# Patient Record
Sex: Male | Born: 1945 | Race: White | Hispanic: No | Marital: Married | Smoking: Former smoker
Health system: Southern US, Community
[De-identification: ages and names within clinical notes are randomized; demographics above are authoritative.]

## PROBLEM LIST (undated history)

## (undated) DIAGNOSIS — K219 Gastro-esophageal reflux disease without esophagitis: Secondary | ICD-10-CM

## (undated) DIAGNOSIS — I499 Cardiac arrhythmia, unspecified: Secondary | ICD-10-CM

## (undated) DIAGNOSIS — I10 Essential (primary) hypertension: Secondary | ICD-10-CM

## (undated) DIAGNOSIS — I200 Unstable angina: Secondary | ICD-10-CM

## (undated) DIAGNOSIS — F419 Anxiety disorder, unspecified: Secondary | ICD-10-CM

## (undated) DIAGNOSIS — I2510 Atherosclerotic heart disease of native coronary artery without angina pectoris: Secondary | ICD-10-CM

## (undated) DIAGNOSIS — J449 Chronic obstructive pulmonary disease, unspecified: Secondary | ICD-10-CM

## (undated) DIAGNOSIS — Z720 Tobacco use: Secondary | ICD-10-CM

## (undated) DIAGNOSIS — E785 Hyperlipidemia, unspecified: Secondary | ICD-10-CM

## (undated) DIAGNOSIS — R0600 Dyspnea, unspecified: Secondary | ICD-10-CM

## (undated) DIAGNOSIS — K5792 Diverticulitis of intestine, part unspecified, without perforation or abscess without bleeding: Secondary | ICD-10-CM

## (undated) DIAGNOSIS — J189 Pneumonia, unspecified organism: Secondary | ICD-10-CM

## (undated) HISTORY — DX: Anxiety disorder, unspecified: F41.9

## (undated) HISTORY — PX: COLONOSCOPY: SHX174

## (undated) HISTORY — DX: Hyperlipidemia, unspecified: E78.5

## (undated) HISTORY — DX: Gastro-esophageal reflux disease without esophagitis: K21.9

## (undated) HISTORY — PX: SHOULDER SURGERY: SHX246

---

## 2002-12-04 ENCOUNTER — Ambulatory Visit (HOSPITAL_COMMUNITY): Admission: RE | Admit: 2002-12-04 | Discharge: 2002-12-04 | Payer: Self-pay | Admitting: Internal Medicine

## 2009-09-08 DIAGNOSIS — F419 Anxiety disorder, unspecified: Secondary | ICD-10-CM | POA: Insufficient documentation

## 2011-01-31 ENCOUNTER — Encounter: Payer: Self-pay | Admitting: Cardiology

## 2011-01-31 ENCOUNTER — Ambulatory Visit (INDEPENDENT_AMBULATORY_CARE_PROVIDER_SITE_OTHER): Payer: 59 | Admitting: Cardiology

## 2011-01-31 VITALS — BP 152/77 | HR 79 | Ht 73.0 in | Wt 187.0 lb

## 2011-01-31 DIAGNOSIS — Z720 Tobacco use: Secondary | ICD-10-CM | POA: Insufficient documentation

## 2011-01-31 DIAGNOSIS — F17200 Nicotine dependence, unspecified, uncomplicated: Secondary | ICD-10-CM

## 2011-01-31 DIAGNOSIS — E785 Hyperlipidemia, unspecified: Secondary | ICD-10-CM | POA: Insufficient documentation

## 2011-01-31 NOTE — Progress Notes (Signed)
   Troy Jackson Date of Birth: 1946-01-19 Medical Record #[id number]  History of Present Illness: Troy Jackson is a 64 year old white male referred by Dr. Waynard Edwards for evaluation of cardiovascular risk. Known cardiac history. He reports having a stress test with me in 2004 which was normal. His cardiovascular risk factors include a history of hyperlipidemia and tobacco use. He is currently smoking one pack per day. He has no interest in quitting. He has been prescribed Vytorin but doesn't take it regularly. He denies any symptoms of shortness of breath, chest pain, or palpitations. He is able to do all his yard work without difficulty. He may get a little fatigued if he has trouble starting his chainsaw or if he has to push his lawn mower.  No current outpatient prescriptions on file prior to visit.    Allergies  Allergen Reactions  . Crestor (Rosuvastatin Calcium) Other (See Comments)    Myalgias     Past Medical History  Diagnosis Date  . Hyperlipidemia   . GERD (gastroesophageal reflux disease)   . Anxiety     Past Surgical History  Procedure Date  . Shoulder surgery     right as child  . Shoulder surgery     left as child    History  Smoking status  . Current Some Day Smoker -- 1.0 packs/day  . Types: Cigarettes  Smokeless tobacco  . Not on file    History  Alcohol Use  . Yes    Family History  Problem Relation Age of Onset  . Heart disease Father   . Lung cancer Father   . Heart attack Father   . Heart attack Paternal Grandfather   . Alcohol abuse Father   . Alcohol abuse Paternal Grandfather   . Alcohol abuse Brother   . Alzheimer's disease Mother     Review of Systems: As noted in history of present illness.  All other systems were reviewed and are negative.  Physical Exam: BP 152/77  Pulse 79  Ht 6\' 1"  (1.854 m)  Wt 187 lb (84.823 kg)  BMI 24.67 kg/m2 The patient is alert and oriented x 3.  The mood and affect are normal.  The skin is warm and  dry.  Color is normal.  The HEENT exam reveals that the sclera are nonicteric.  The mucous membranes are moist.  The carotids are 2+ without bruits.  There is no thyromegaly.  There is no JVD.  The lungs are clear.  The chest wall is non tender.  The heart exam reveals a regular rate with a normal S1 and S2.  There are no murmurs, gallops, or rubs.  The PMI is not displaced.   Abdominal exam reveals good bowel sounds.  There is no guarding or rebound.  There is no hepatosplenomegaly or tenderness.  There are no masses.  Exam of the legs reveal no clubbing, cyanosis, or edema.  The legs are without rashes.  The distal pulses are intact.  Cranial nerves II - XII are intact.  Motor and sensory functions are intact.  The gait is normal.  LABORATORY DATA: ECG today is normal.  Assessment / Plan:

## 2011-01-31 NOTE — Patient Instructions (Signed)
I recommend you quit smoking.  Stay active with regular aerobic activity.  If you should develop symptoms of chest pain or worsening shortness of breath then let me know.

## 2011-01-31 NOTE — Assessment & Plan Note (Signed)
I have recommended that he take his Vytorin as prescribed by Dr. Waynard Edwards. From a cardiovascular standpoint his risk is low. He has a normal exam and ECG. He has no symptoms to suggest angina. I don't recommend a stress test at this point unless he should develop cardiac symptoms.

## 2011-01-31 NOTE — Assessment & Plan Note (Signed)
I stressed the importance of smoking cessation. He has no interest in quitting at this time.

## 2012-01-02 ENCOUNTER — Encounter (HOSPITAL_COMMUNITY): Payer: Self-pay | Admitting: Emergency Medicine

## 2012-01-02 ENCOUNTER — Emergency Department (HOSPITAL_COMMUNITY)
Admission: EM | Admit: 2012-01-02 | Discharge: 2012-01-02 | Disposition: A | Discharge status: Home / Self Care | Payer: Medicare Other | Attending: Emergency Medicine | Admitting: Emergency Medicine

## 2012-01-02 DIAGNOSIS — Z79899 Other long term (current) drug therapy: Secondary | ICD-10-CM | POA: Insufficient documentation

## 2012-01-02 DIAGNOSIS — Z7982 Long term (current) use of aspirin: Secondary | ICD-10-CM | POA: Insufficient documentation

## 2012-01-02 DIAGNOSIS — E785 Hyperlipidemia, unspecified: Secondary | ICD-10-CM | POA: Insufficient documentation

## 2012-01-02 DIAGNOSIS — F17200 Nicotine dependence, unspecified, uncomplicated: Secondary | ICD-10-CM | POA: Insufficient documentation

## 2012-01-02 DIAGNOSIS — M25569 Pain in unspecified knee: Secondary | ICD-10-CM | POA: Insufficient documentation

## 2012-01-02 DIAGNOSIS — F411 Generalized anxiety disorder: Secondary | ICD-10-CM | POA: Insufficient documentation

## 2012-01-02 DIAGNOSIS — K219 Gastro-esophageal reflux disease without esophagitis: Secondary | ICD-10-CM | POA: Insufficient documentation

## 2012-01-02 NOTE — ED Provider Notes (Signed)
History     CSN: [id number]  Arrival date & time 01/02/12  1722   First MD Initiated Contact with Patient 01/02/12 1833      Chief Complaint  Patient presents with  . Bradycardia    (Consider location/radiation/quality/duration/timing/severity/associated sxs/prior treatment) HPI Comments: This is a 66 year old male, who presents to the emergency department with the chief complaint of bradycardia. The patient also complains of bilateral knee pain. The patient states that earlier today, he took his heart rate and found that it was 44. He used a home pulse/ox device. Several minutes later, and he checked his heart rate again and that was normal. Patient denies chest pain, shortness of breath, dizziness, and weakness. Patient has not been pain. He states that his knees only bother him if he is too active.  The history is provided by the patient. No language interpreter was used.    Past Medical History  Diagnosis Date  . Hyperlipidemia   . GERD (gastroesophageal reflux disease)   . Anxiety     Past Surgical History  Procedure Date  . Shoulder surgery     right as child  . Shoulder surgery     left as child    Family History  Problem Relation Age of Onset  . Heart disease Father   . Lung cancer Father   . Heart attack Father   . Heart attack Paternal Grandfather   . Alcohol abuse Father   . Alcohol abuse Paternal Grandfather   . Alcohol abuse Brother   . Alzheimer's disease Mother     History  Substance Use Topics  . Smoking status: Current Some Day Smoker -- 1.0 packs/day    Types: Cigarettes  . Smokeless tobacco: Not on file  . Alcohol Use: Yes      Review of Systems  Constitutional: Negative for fever.  HENT: Negative for rhinorrhea.   Eyes: Negative for visual disturbance.  Respiratory: Negative for shortness of breath.   Cardiovascular: Negative for chest pain.  Gastrointestinal: Negative for abdominal pain.  Genitourinary: Negative for dysuria.    Musculoskeletal: Negative for back pain.       Bilateral knee pain  Skin: Negative for rash.  Neurological: Negative for dizziness, syncope, weakness, light-headedness and numbness.  Psychiatric/Behavioral: The patient is not nervous/anxious.   All other systems reviewed and are negative.    Allergies  Crestor  Home Medications   Current Outpatient Rx  Name Route Sig Dispense Refill  . ALPRAZOLAM 0.5 MG PO TABS Oral Take 0.5 mg by mouth at bedtime as needed. For anxiety    . ASPIRIN 81 MG PO TABS Oral Take 81 mg by mouth daily.      Marland Kitchen ESOMEPRAZOLE MAGNESIUM 40 MG PO CPDR Oral Take 40 mg by mouth daily before breakfast.     . EZETIMIBE-SIMVASTATIN 10-20 MG PO TABS Oral Take 1 tablet by mouth at bedtime.        BP 171/80  Pulse 90  Temp 98.7 F (37.1 C) (Oral)  SpO2 97%  Physical Exam  Nursing note and vitals reviewed. Constitutional: He is oriented to person, place, and time. He appears well-developed and well-nourished.  HENT:  Head: Normocephalic and atraumatic.  Eyes: Conjunctivae normal and EOM are normal. Pupils are equal, round, and reactive to light.  Neck: Normal range of motion. Neck supple.  Cardiovascular: Normal rate, regular rhythm and normal heart sounds.   Pulmonary/Chest: Effort normal and breath sounds normal.  Abdominal: Soft. Bowel sounds are normal.  Musculoskeletal: Normal  range of motion. He exhibits no edema and no tenderness.       No clicking, popping, or catching. Stability testing is intact bilaterally. Patient walks without a limp.  Neurological: He is alert and oriented to person, place, and time.  Skin: Skin is warm and dry.  Psychiatric: He has a normal mood and affect. His behavior is normal. Judgment and thought content normal.    ED Course  Procedures (including critical care time)  Labs Reviewed - No data to display No results found. ED ECG REPORT  I personally interpreted this EKG   Date: 01/02/2012   Rate:82  Rhythm: normal  sinus rhythm  QRS Axis: normal  Intervals: normal  ST/T Wave abnormalities: normal  Conduction Disutrbances:none  Narrative Interpretation:   Old EKG Reviewed: none available    1. Knee pain       MDM  66 year old male with knee pain. Patient shows no sign of bradycardia while in the ED. I'm going to refer the patient to his PCP for further workup if deemed necessary. 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 Will recommend Tylenol for the knee pain. Patient is stable and ready for discharge.        Roxy Horseman, PA-C 01/02/12 1906

## 2012-01-02 NOTE — ED Provider Notes (Signed)
Medical screening examination/treatment/procedure(s) were performed by non-physician practitioner and as supervising physician I was immediately available for consultation/collaboration.   Gerhard Munch, MD 01/02/12 [phone]

## 2012-01-02 NOTE — ED Notes (Signed)
Pt st's when he was at home at rest this pulse rate was 44.  Only c/o he has is bil knee pain over past few days.  Pt denies any chest pain or shortness of breath.

## 2012-01-02 NOTE — ED Notes (Signed)
Pt is retired Company secretary.  States he took his pulse this morning and it was 44. Denies any symptoms and pulse has been fine since.  Pt came to ED to be checked for bilateral knee pain after walking long distances.  Pt takes tylenol at night per MD orders for the knee pain but takes no meds during the day.  Pt states the knee pain is relieved by rest

## 2012-09-23 ENCOUNTER — Encounter (HOSPITAL_BASED_OUTPATIENT_CLINIC_OR_DEPARTMENT_OTHER): Payer: Self-pay | Admitting: *Deleted

## 2012-09-23 ENCOUNTER — Emergency Department (HOSPITAL_BASED_OUTPATIENT_CLINIC_OR_DEPARTMENT_OTHER)
Admission: EM | Admit: 2012-09-23 | Discharge: 2012-09-23 | Disposition: A | Discharge status: Home / Self Care | Payer: Medicare Other | Attending: Emergency Medicine | Admitting: Emergency Medicine

## 2012-09-23 DIAGNOSIS — Z862 Personal history of diseases of the blood and blood-forming organs and certain disorders involving the immune mechanism: Secondary | ICD-10-CM | POA: Insufficient documentation

## 2012-09-23 DIAGNOSIS — F17200 Nicotine dependence, unspecified, uncomplicated: Secondary | ICD-10-CM | POA: Insufficient documentation

## 2012-09-23 DIAGNOSIS — S81009A Unspecified open wound, unspecified knee, initial encounter: Secondary | ICD-10-CM | POA: Insufficient documentation

## 2012-09-23 DIAGNOSIS — W1809XA Striking against other object with subsequent fall, initial encounter: Secondary | ICD-10-CM | POA: Insufficient documentation

## 2012-09-23 DIAGNOSIS — Y9289 Other specified places as the place of occurrence of the external cause: Secondary | ICD-10-CM | POA: Insufficient documentation

## 2012-09-23 DIAGNOSIS — F411 Generalized anxiety disorder: Secondary | ICD-10-CM | POA: Insufficient documentation

## 2012-09-23 DIAGNOSIS — Y9389 Activity, other specified: Secondary | ICD-10-CM | POA: Insufficient documentation

## 2012-09-23 DIAGNOSIS — Z7982 Long term (current) use of aspirin: Secondary | ICD-10-CM | POA: Insufficient documentation

## 2012-09-23 DIAGNOSIS — K219 Gastro-esophageal reflux disease without esophagitis: Secondary | ICD-10-CM | POA: Insufficient documentation

## 2012-09-23 DIAGNOSIS — Z79899 Other long term (current) drug therapy: Secondary | ICD-10-CM | POA: Insufficient documentation

## 2012-09-23 DIAGNOSIS — Z8639 Personal history of other endocrine, nutritional and metabolic disease: Secondary | ICD-10-CM | POA: Insufficient documentation

## 2012-09-23 DIAGNOSIS — S81811A Laceration without foreign body, right lower leg, initial encounter: Secondary | ICD-10-CM

## 2012-09-23 MED ORDER — SULFAMETHOXAZOLE-TRIMETHOPRIM 800-160 MG PO TABS: 1.0000 | ORAL_TABLET | Freq: Two times a day (BID) | ORAL | Status: AC

## 2012-09-23 MED ORDER — HYDROCODONE-ACETAMINOPHEN 5-325 MG PO TABS: 2.0000 | ORAL_TABLET | ORAL | Status: DC | PRN

## 2012-09-23 NOTE — ED Provider Notes (Signed)
History  This chart was scribed for Glynn Octave, MD, by Yevette Edwards, ED Scribe. This patient was seen in room MH12/MH12 and the patient's care was started at 10:34 PM.  CSN: [id number] Arrival date & time 09/23/12  2038  First MD Initiated Contact with Patient 09/23/12 2233     Chief Complaint  Patient presents with  . Extremity Laceration    The history is provided by the patient. No language interpreter was used.   HPI Comments: Troy Jackson is a 67 y.o. male who presents to the Emergency Department complaining of a laceration to  the medial aspect of his right lower leg which occurred earlier today after he fell onto the edge of a corn-hole board. The pt reports that he is ambulatory, though he reports that there is pain associated with the laceration. He states that he had a tetanus booster last year. He denies usage of blood thinners. He has a h/o of hyperlipidemia. He is a current smoker, and he uses alcohol. Past Medical History  Diagnosis Date  . Hyperlipidemia   . GERD (gastroesophageal reflux disease)   . Anxiety    Past Surgical History  Procedure Laterality Date  . Shoulder surgery      right as child  . Shoulder surgery      left as child   Family History  Problem Relation Age of Onset  . Heart disease Father   . Lung cancer Father   . Heart attack Father   . Heart attack Paternal Grandfather   . Alcohol abuse Father   . Alcohol abuse Paternal Grandfather   . Alcohol abuse Brother   . Alzheimer's disease Mother    History  Substance Use Topics  . Smoking status: Current Some Day Smoker -- 1.00 packs/day    Types: Cigarettes  . Smokeless tobacco: Not on file  . Alcohol Use: Yes     Comment: occasional     Review of Systems  Constitutional: Negative for fever.  Skin: Positive for wound.  All other systems reviewed and are negative.    Allergies  Crestor  Home Medications   Current Outpatient Rx  Name  Route  Sig  Dispense  Refill   . ALPRAZolam (XANAX) 0.5 MG tablet   Oral   Take 0.5 mg by mouth at bedtime as needed. For anxiety         . aspirin 81 MG tablet   Oral   Take 81 mg by mouth daily.           Marland Kitchen esomeprazole (NEXIUM) 40 MG capsule   Oral   Take 40 mg by mouth daily before breakfast.          . ezetimibe-simvastatin (VYTORIN) 10-20 MG per tablet   Oral   Take 1 tablet by mouth at bedtime.            Triage Vitals: BP 145/84  Pulse 82  Temp(Src) 98.8 F (37.1 C) (Oral)  SpO2 96%  Physical Exam  Nursing note and vitals reviewed. Constitutional: He is oriented to person, place, and time. He appears well-developed and well-nourished. No distress.  HENT:  Head: Normocephalic and atraumatic.  Eyes: EOM are normal.  Neck: Normal range of motion. Neck supple. No tracheal deviation present.  Cardiovascular: Normal rate.   Pulmonary/Chest: Effort normal and breath sounds normal. No respiratory distress.  Musculoskeletal: Normal range of motion.  3 cm transverse laceration to the right lower leg with fat exposed and fascia visible. Full  ROM of toes and ankle. +2 DP and PT pulse.   Neurological: He is alert and oriented to person, place, and time.  Skin: Skin is warm and dry.  Psychiatric: He has a normal mood and affect. His behavior is normal.    ED Course  Procedures (including critical care time)  DIAGNOSTIC STUDIES: Oxygen Saturation is 96% on room air, normal by my interpretation.    COORDINATION OF CARE:  10:36 PM Discussed treatment plan with patient which includes sutures and the possibility of a scar due to the nature of the wound, and the patient agreed to the plan.    Labs Reviewed - No data to display No results found. No diagnosis found.  MDM  RLE laceration from wooden board. Tetanus UTD.   Neurovascularly intact, fascia visible. Remaining skin is very thin. Patient understands that scar is likely, will take wide bites to not pull through skin.  Laceration  repaired by PAC sofia.  I personally performed the services described in this documentation, which was scribed in my presence. The recorded information has been reviewed and is accurate.    Glynn Octave, MD 09/24/12 [phone]

## 2012-09-23 NOTE — ED Notes (Signed)
Was playing corn hole when he lost his balance and fell into the edge of the corn hold wooden board causing a lacertion/skin tear to his right inner lower leg. Over 2 inch laceration with bleeding controlled.  No numbness or tingling of his toes. Td is  Up to date.

## 2012-09-23 NOTE — ED Provider Notes (Signed)
   History    CSN: [id number] Arrival date & time 09/23/12  2038  First MD Initiated Contact with Patient 09/23/12 2233     Chief Complaint  Patient presents with  . Extremity Laceration   (Consider location/radiation/quality/duration/timing/severity/associated sxs/prior Treatment) HPI Past Medical History  Diagnosis Date  . Hyperlipidemia   . GERD (gastroesophageal reflux disease)   . Anxiety    Past Surgical History  Procedure Laterality Date  . Shoulder surgery      right as child  . Shoulder surgery      left as child   Family History  Problem Relation Age of Onset  . Heart disease Father   . Lung cancer Father   . Heart attack Father   . Heart attack Paternal Grandfather   . Alcohol abuse Father   . Alcohol abuse Paternal Grandfather   . Alcohol abuse Brother   . Alzheimer's disease Mother    History  Substance Use Topics  . Smoking status: Current Some Day Smoker -- 1.00 packs/day    Types: Cigarettes  . Smokeless tobacco: Not on file  . Alcohol Use: Yes     Comment: occasional     Review of Systems  Allergies  Crestor  Home Medications   Current Outpatient Rx  Name  Route  Sig  Dispense  Refill  . ALPRAZolam (XANAX) 0.5 MG tablet   Oral   Take 0.5 mg by mouth at bedtime as needed. For anxiety         . aspirin 81 MG tablet   Oral   Take 81 mg by mouth daily.           Marland Kitchen esomeprazole (NEXIUM) 40 MG capsule   Oral   Take 40 mg by mouth daily before breakfast.          . ezetimibe-simvastatin (VYTORIN) 10-20 MG per tablet   Oral   Take 1 tablet by mouth at bedtime.           Marland Kitchen HYDROcodone-acetaminophen (NORCO/VICODIN) 5-325 MG per tablet   Oral   Take 2 tablets by mouth every 4 (four) hours as needed.   20 tablet   0   . sulfamethoxazole-trimethoprim (BACTRIM DS,SEPTRA DS) 800-160 MG per tablet   Oral   Take 1 tablet by mouth 2 (two) times daily.   14 tablet   0    BP 145/84  Pulse 82  Temp(Src) 98.8 F (37.1 C) (Oral)   SpO2 96% Physical Exam  ED Course  LACERATION REPAIR Date/Time: 09/23/2012 11:40 PM Performed by: Elson Areas Authorized by: Elson Areas Consent: Verbal consent obtained. Consent given by: patient Patient identity confirmed: verbally with patient Time out: Immediately prior to procedure a "time out" was called to verify the correct patient, procedure, equipment, support staff and site/side marked as required. Body area: lower extremity Laceration length: 7 cm Foreign bodies: no foreign bodies Local anesthetic: lidocaine 2% without epinephrine Patient sedated: no Preparation: Patient was prepped and draped in the usual sterile fashion. Irrigation solution: saline Amount of cleaning: standard Skin closure: 4-0 Prolene Number of sutures: 6 Technique: simple Approximation: loose Approximation difficulty: simple Patient tolerance: Patient tolerated the procedure well with no immediate complications.   (including critical care time) Labs Reviewed - No data to display No results found. 1. Laceration of leg, right, initial encounter     MDM  Bactrim and hydrocodone.   Suture removal in 8 days  Elson Areas, [place] 09/23/12 2342

## 2012-09-24 NOTE — ED Provider Notes (Signed)
Medical screening examination/treatment/procedure(s) were conducted as a shared visit with non-physician practitioner(s) and myself.  I personally evaluated the patient during the encounter  See my additional note  Glynn Octave, MD 09/24/12 [phone]

## 2013-04-01 ENCOUNTER — Other Ambulatory Visit: Payer: Self-pay | Admitting: Internal Medicine

## 2013-04-01 DIAGNOSIS — K5730 Diverticulosis of large intestine without perforation or abscess without bleeding: Secondary | ICD-10-CM

## 2013-04-02 ENCOUNTER — Ambulatory Visit
Admission: RE | Admit: 2013-04-02 | Discharge: 2013-04-02 | Disposition: A | Discharge status: Home / Self Care | Payer: Medicare Other | Source: Ambulatory Visit | Attending: Internal Medicine | Admitting: Internal Medicine

## 2013-04-02 DIAGNOSIS — K5730 Diverticulosis of large intestine without perforation or abscess without bleeding: Secondary | ICD-10-CM

## 2013-04-02 MED ORDER — IOHEXOL 300 MG/ML  SOLN
100.0000 mL | Freq: Once | INTRAMUSCULAR | Status: AC | PRN
  Administered 2013-04-02: 100 mL via INTRAVENOUS

## 2014-05-05 ENCOUNTER — Other Ambulatory Visit: Payer: Self-pay | Admitting: Internal Medicine

## 2014-05-05 ENCOUNTER — Ambulatory Visit
Admission: RE | Admit: 2014-05-05 | Discharge: 2014-05-05 | Disposition: A | Discharge status: Home / Self Care | Payer: Medicare Other | Source: Ambulatory Visit | Attending: Internal Medicine | Admitting: Internal Medicine

## 2014-05-05 DIAGNOSIS — M545 Low back pain: Secondary | ICD-10-CM

## 2015-03-15 DIAGNOSIS — I2510 Atherosclerotic heart disease of native coronary artery without angina pectoris: Secondary | ICD-10-CM

## 2015-03-15 DIAGNOSIS — Z720 Tobacco use: Secondary | ICD-10-CM

## 2015-03-15 HISTORY — DX: Atherosclerotic heart disease of native coronary artery without angina pectoris: I25.10

## 2015-03-15 HISTORY — DX: Tobacco use: Z72.0

## 2016-02-03 ENCOUNTER — Encounter (HOSPITAL_COMMUNITY)
Admission: EM | Disposition: A | Discharge status: Home / Self Care | Payer: Self-pay | Source: Home / Self Care | Attending: Internal Medicine

## 2016-02-03 ENCOUNTER — Emergency Department (HOSPITAL_COMMUNITY): Payer: Medicare Other

## 2016-02-03 ENCOUNTER — Inpatient Hospital Stay (HOSPITAL_COMMUNITY)
Admission: EM | Admit: 2016-02-03 | Discharge: 2016-02-04 | DRG: 247 | Disposition: A | Discharge status: Home / Self Care | Payer: Medicare Other | Attending: Internal Medicine | Admitting: Internal Medicine

## 2016-02-03 ENCOUNTER — Encounter (HOSPITAL_COMMUNITY): Payer: Self-pay

## 2016-02-03 ENCOUNTER — Observation Stay (HOSPITAL_BASED_OUTPATIENT_CLINIC_OR_DEPARTMENT_OTHER): Payer: Medicare Other

## 2016-02-03 DIAGNOSIS — I25110 Atherosclerotic heart disease of native coronary artery with unstable angina pectoris: Secondary | ICD-10-CM | POA: Diagnosis present

## 2016-02-03 DIAGNOSIS — R079 Chest pain, unspecified: Secondary | ICD-10-CM | POA: Diagnosis present

## 2016-02-03 DIAGNOSIS — I700 Atherosclerosis of aorta: Secondary | ICD-10-CM | POA: Diagnosis present

## 2016-02-03 DIAGNOSIS — I214 Non-ST elevation (NSTEMI) myocardial infarction: Secondary | ICD-10-CM | POA: Diagnosis not present

## 2016-02-03 DIAGNOSIS — J449 Chronic obstructive pulmonary disease, unspecified: Secondary | ICD-10-CM | POA: Diagnosis present

## 2016-02-03 DIAGNOSIS — Z7982 Long term (current) use of aspirin: Secondary | ICD-10-CM | POA: Diagnosis not present

## 2016-02-03 DIAGNOSIS — Z888 Allergy status to other drugs, medicaments and biological substances status: Secondary | ICD-10-CM

## 2016-02-03 DIAGNOSIS — R0789 Other chest pain: Secondary | ICD-10-CM | POA: Diagnosis present

## 2016-02-03 DIAGNOSIS — Z79891 Long term (current) use of opiate analgesic: Secondary | ICD-10-CM

## 2016-02-03 DIAGNOSIS — E785 Hyperlipidemia, unspecified: Secondary | ICD-10-CM | POA: Diagnosis present

## 2016-02-03 DIAGNOSIS — F419 Anxiety disorder, unspecified: Secondary | ICD-10-CM | POA: Diagnosis present

## 2016-02-03 DIAGNOSIS — K219 Gastro-esophageal reflux disease without esophagitis: Secondary | ICD-10-CM | POA: Diagnosis present

## 2016-02-03 DIAGNOSIS — Z79899 Other long term (current) drug therapy: Secondary | ICD-10-CM | POA: Diagnosis not present

## 2016-02-03 DIAGNOSIS — E784 Other hyperlipidemia: Secondary | ICD-10-CM | POA: Diagnosis not present

## 2016-02-03 DIAGNOSIS — I200 Unstable angina: Secondary | ICD-10-CM

## 2016-02-03 DIAGNOSIS — Z8249 Family history of ischemic heart disease and other diseases of the circulatory system: Secondary | ICD-10-CM

## 2016-02-03 DIAGNOSIS — F17210 Nicotine dependence, cigarettes, uncomplicated: Secondary | ICD-10-CM | POA: Diagnosis present

## 2016-02-03 HISTORY — DX: Diverticulitis of intestine, part unspecified, without perforation or abscess without bleeding: K57.92

## 2016-02-03 HISTORY — DX: Chronic obstructive pulmonary disease, unspecified: J44.9

## 2016-02-03 HISTORY — PX: CORONARY ANGIOPLASTY WITH STENT PLACEMENT: SHX49

## 2016-02-03 HISTORY — PX: CARDIAC CATHETERIZATION: SHX172

## 2016-02-03 HISTORY — DX: Tobacco use: Z72.0

## 2016-02-03 HISTORY — DX: Atherosclerotic heart disease of native coronary artery without angina pectoris: I25.10

## 2016-02-03 LAB — TROPONIN I
TROPONIN I: 0.06 ng/mL — AB (ref <0.03)
TROPONIN I: 0.08 ng/mL — AB (ref <0.03)
Troponin I: 0.03 ng/mL (ref <0.03)

## 2016-02-03 LAB — CBC
HEMATOCRIT: 43.7 % (ref 39.0–52.0)
HEMOGLOBIN: 15 g/dL (ref 13.0–17.0)
MCH: 33.1 pg (ref 26.0–34.0)
MCHC: 34.3 g/dL (ref 30.0–36.0)
MCV: 96.5 fL (ref 78.0–100.0)
Platelets: 230 10*3/uL (ref 150–400)
RBC: 4.53 MIL/uL (ref 4.22–5.81)
RDW: 12.5 % (ref 11.5–15.5)
WBC: 5.6 10*3/uL (ref 4.0–10.5)

## 2016-02-03 LAB — BASIC METABOLIC PANEL
ANION GAP: 7 (ref 5–15)
BUN: 12 mg/dL (ref 6–20)
CHLORIDE: 105 mmol/L (ref 101–111)
CO2: 28 mmol/L (ref 22–32)
Calcium: 8.7 mg/dL — ABNORMAL LOW (ref 8.9–10.3)
Creatinine, Ser: 1.07 mg/dL (ref 0.61–1.24)
Glucose, Bld: 120 mg/dL — ABNORMAL HIGH (ref 65–99)
POTASSIUM: 4.2 mmol/L (ref 3.5–5.1)
SODIUM: 140 mmol/L (ref 135–145)

## 2016-02-03 LAB — LIPID PANEL
CHOLESTEROL: 195 mg/dL (ref 0–200)
HDL: 41 mg/dL (ref >40)
LDL CALC: 134 mg/dL — AB (ref 0–99)
TRIGLYCERIDES: 100 mg/dL (ref <150)
Total CHOL/HDL Ratio: 4.8 RATIO
VLDL: 20 mg/dL (ref 0–40)

## 2016-02-03 LAB — ECHOCARDIOGRAM COMPLETE
Height: 73 in
Weight: 3105.6 oz

## 2016-02-03 LAB — POCT ACTIVATED CLOTTING TIME
ACTIVATED CLOTTING TIME: 263 s
Activated Clotting Time: 235 seconds

## 2016-02-03 LAB — I-STAT TROPONIN, ED: Troponin i, poc: 0 ng/mL (ref 0.00–0.08)

## 2016-02-03 LAB — HEPARIN LEVEL (UNFRACTIONATED): HEPARIN UNFRACTIONATED: 0.37 [IU]/mL (ref 0.30–0.70)

## 2016-02-03 SURGERY — LEFT HEART CATH AND CORONARY ANGIOGRAPHY
Anesthesia: LOCAL

## 2016-02-03 MED ORDER — MOMETASONE FURO-FORMOTEROL FUM 200-5 MCG/ACT IN AERO
2.0000 | INHALATION_SPRAY | Freq: Two times a day (BID) | RESPIRATORY_TRACT | Status: DC
  Administered 2016-02-03 – 2016-02-04 (×3): 2 via RESPIRATORY_TRACT
  Filled 2016-02-03: qty 8.8

## 2016-02-03 MED ORDER — NITROGLYCERIN 0.4 MG SL SUBL
0.4000 mg | SUBLINGUAL_TABLET | SUBLINGUAL | Status: DC | PRN
  Administered 2016-02-03 (×2): 0.4 mg via SUBLINGUAL
  Filled 2016-02-03: qty 1

## 2016-02-03 MED ORDER — HEPARIN SODIUM (PORCINE) 1000 UNIT/ML IJ SOLN
INTRAMUSCULAR | Status: DC | PRN
  Administered 2016-02-03: 2000 [IU] via INTRAVENOUS
  Administered 2016-02-03: 4000 [IU] via INTRAVENOUS
  Administered 2016-02-03: 4500 [IU] via INTRAVENOUS

## 2016-02-03 MED ORDER — ASPIRIN 81 MG PO CHEW: 81.0000 mg | CHEWABLE_TABLET | ORAL | Status: DC

## 2016-02-03 MED ORDER — SODIUM CHLORIDE 0.9 % IV SOLN: 250.0000 mL | INTRAVENOUS | Status: DC | PRN

## 2016-02-03 MED ORDER — SODIUM CHLORIDE 0.9 % IV SOLN
INTRAVENOUS | Status: DC
  Administered 2016-02-03: 15:00:00 via INTRAVENOUS

## 2016-02-03 MED ORDER — ALBUTEROL SULFATE (2.5 MG/3ML) 0.083% IN NEBU
3.0000 mL | INHALATION_SOLUTION | Freq: Four times a day (QID) | RESPIRATORY_TRACT | Status: DC | PRN
  Administered 2016-02-04: 08:00:00 3 mL via RESPIRATORY_TRACT
  Filled 2016-02-03: qty 3

## 2016-02-03 MED ORDER — TICAGRELOR 90 MG PO TABS
ORAL_TABLET | ORAL | Status: DC | PRN
  Administered 2016-02-03: 180 mg via ORAL

## 2016-02-03 MED ORDER — MIDAZOLAM HCL 2 MG/2ML IJ SOLN
INTRAMUSCULAR | Status: DC | PRN
  Administered 2016-02-03 (×3): 1 mg via INTRAVENOUS

## 2016-02-03 MED ORDER — ONDANSETRON HCL 4 MG/2ML IJ SOLN: 4.0000 mg | Freq: Four times a day (QID) | INTRAMUSCULAR | Status: DC | PRN

## 2016-02-03 MED ORDER — SODIUM CHLORIDE 0.9% FLUSH: 3.0000 mL | Freq: Two times a day (BID) | INTRAVENOUS | Status: DC

## 2016-02-03 MED ORDER — SODIUM CHLORIDE 0.9 % WEIGHT BASED INFUSION
1.0000 mL/kg/h | INTRAVENOUS | Status: AC
  Administered 2016-02-03: 19:00:00 1 mL/kg/h via INTRAVENOUS

## 2016-02-03 MED ORDER — ALPRAZOLAM 0.5 MG PO TABS
0.5000 mg | ORAL_TABLET | ORAL | Status: AC
  Administered 2016-02-03: 0.5 mg via ORAL
  Filled 2016-02-03: qty 1

## 2016-02-03 MED ORDER — HYDRALAZINE HCL 20 MG/ML IJ SOLN: 5.0000 mg | INTRAMUSCULAR | Status: AC | PRN

## 2016-02-03 MED ORDER — TICAGRELOR 90 MG PO TABS
90.0000 mg | ORAL_TABLET | Freq: Two times a day (BID) | ORAL | Status: DC
  Administered 2016-02-04: 90 mg via ORAL
  Filled 2016-02-03: qty 1

## 2016-02-03 MED ORDER — NITROGLYCERIN 2 % TD OINT
0.5000 [in_us] | TOPICAL_OINTMENT | Freq: Once | TRANSDERMAL | Status: AC
  Administered 2016-02-03: 0.5 [in_us] via TOPICAL
  Filled 2016-02-03: qty 1

## 2016-02-03 MED ORDER — IOPAMIDOL (ISOVUE-370) INJECTION 76%
INTRAVENOUS | Status: DC | PRN
  Administered 2016-02-03: 215 mL

## 2016-02-03 MED ORDER — NITROGLYCERIN 1 MG/10 ML FOR IR/CATH LAB
INTRA_ARTERIAL | Status: DC | PRN
  Administered 2016-02-03 (×2): 200 ug via INTRACORONARY

## 2016-02-03 MED ORDER — PANTOPRAZOLE SODIUM 40 MG PO TBEC
40.0000 mg | DELAYED_RELEASE_TABLET | Freq: Every day | ORAL | Status: DC
  Administered 2016-02-03 – 2016-02-04 (×2): 40 mg via ORAL
  Filled 2016-02-03 (×2): qty 1

## 2016-02-03 MED ORDER — SODIUM CHLORIDE 0.9% FLUSH: 3.0000 mL | INTRAVENOUS | Status: DC | PRN

## 2016-02-03 MED ORDER — HEPARIN (PORCINE) IN NACL 100-0.45 UNIT/ML-% IJ SOLN: 1050.0000 [IU]/h | INTRAMUSCULAR | Status: DC

## 2016-02-03 MED ORDER — VERAPAMIL HCL 2.5 MG/ML IV SOLN
INTRAVENOUS | Status: DC | PRN
  Administered 2016-02-03: 10 mL via INTRA_ARTERIAL

## 2016-02-03 MED ORDER — ASPIRIN EC 325 MG PO TBEC
325.0000 mg | DELAYED_RELEASE_TABLET | Freq: Every day | ORAL | Status: DC
  Administered 2016-02-03: 325 mg via ORAL
  Filled 2016-02-03: qty 1

## 2016-02-03 MED ORDER — HEPARIN (PORCINE) IN NACL 2-0.9 UNIT/ML-% IJ SOLN
INTRAMUSCULAR | Status: DC | PRN
  Administered 2016-02-03: 1000 mL

## 2016-02-03 MED ORDER — ALPRAZOLAM 0.5 MG PO TABS
0.5000 mg | ORAL_TABLET | Freq: Three times a day (TID) | ORAL | Status: DC | PRN
  Administered 2016-02-03 – 2016-02-04 (×2): 0.5 mg via ORAL
  Filled 2016-02-03 (×2): qty 1

## 2016-02-03 MED ORDER — HEPARIN BOLUS VIA INFUSION
4000.0000 [IU] | Freq: Once | INTRAVENOUS | Status: AC
  Administered 2016-02-03: 4000 [IU] via INTRAVENOUS
  Filled 2016-02-03: qty 4000

## 2016-02-03 MED ORDER — SODIUM CHLORIDE 0.9% FLUSH
3.0000 mL | Freq: Two times a day (BID) | INTRAVENOUS | Status: DC
  Administered 2016-02-03: 3 mL via INTRAVENOUS

## 2016-02-03 MED ORDER — ANGIOPLASTY BOOK
Freq: Once | Status: AC
  Administered 2016-02-03: 21:00:00
  Filled 2016-02-03: qty 1

## 2016-02-03 MED ORDER — LIDOCAINE HCL (PF) 1 % IJ SOLN
INTRAMUSCULAR | Status: DC | PRN
  Administered 2016-02-03: 3 mL

## 2016-02-03 MED ORDER — ASPIRIN 81 MG PO CHEW
81.0000 mg | CHEWABLE_TABLET | Freq: Every day | ORAL | Status: DC
  Administered 2016-02-04: 08:00:00 81 mg via ORAL
  Filled 2016-02-03: qty 1

## 2016-02-03 MED ORDER — ACETAMINOPHEN 325 MG PO TABS: 650.0000 mg | ORAL_TABLET | ORAL | Status: DC | PRN

## 2016-02-03 MED ORDER — LABETALOL HCL 5 MG/ML IV SOLN: 10.0000 mg | INTRAVENOUS | Status: AC | PRN

## 2016-02-03 MED ORDER — HEPARIN (PORCINE) IN NACL 100-0.45 UNIT/ML-% IJ SOLN
1000.0000 [IU]/h | INTRAMUSCULAR | Status: DC
  Administered 2016-02-03: 1000 [IU]/h via INTRAVENOUS
  Filled 2016-02-03: qty 250

## 2016-02-03 MED ORDER — FENTANYL CITRATE (PF) 100 MCG/2ML IJ SOLN
INTRAMUSCULAR | Status: DC | PRN
  Administered 2016-02-03: 50 ug via INTRAVENOUS
  Administered 2016-02-03 (×3): 25 ug via INTRAVENOUS

## 2016-02-03 SURGICAL SUPPLY — 19 items
BALLN EMERGE MR 2.5X12 (BALLOONS) ×2
BALLN ~~LOC~~ TREK RX 3.0X12 (BALLOONS) ×2
BALLOON EMERGE MR 2.5X12 (BALLOONS) IMPLANT
BALLOON ~~LOC~~ TREK RX 3.0X12 (BALLOONS) IMPLANT
CATH 5FR JL3.5 JR4 ANG PIG MP (CATHETERS) ×1 IMPLANT
CATH VISTA GUIDE 6FR H STICK (CATHETERS) ×1 IMPLANT
DEVICE RAD COMP TR BAND LRG (VASCULAR PRODUCTS) ×1 IMPLANT
GLIDESHEATH SLEND SS 6F .021 (SHEATH) ×1 IMPLANT
GUIDELINER 6F (CATHETERS) ×1 IMPLANT
GUIDEWIRE INQWIRE 1.5J.035X260 (WIRE) ×1 IMPLANT
INQWIRE 1.5J .035X260CM (WIRE) ×2
KIT ENCORE 26 ADVANTAGE (KITS) ×1 IMPLANT
KIT HEART LEFT (KITS) ×2 IMPLANT
PACK CARDIAC CATHETERIZATION (CUSTOM PROCEDURE TRAY) ×2 IMPLANT
STENT SYNERGY DES 3X16 (Permanent Stent) ×1 IMPLANT
SYR MEDRAD MARK V 150ML (SYRINGE) ×2 IMPLANT
TRANSDUCER W/STOPCOCK (MISCELLANEOUS) ×2 IMPLANT
TUBING CIL FLEX 10 FLL-RA (TUBING) ×2 IMPLANT
WIRE ASAHI PROWATER 180CM (WIRE) ×1 IMPLANT

## 2016-02-03 NOTE — Care Management Obs Status (Signed)
MEDICARE OBSERVATION STATUS NOTIFICATION   Patient Details  Name: Stasia CavalierWilliam S Withers MRN: [id number] Date of Birth: 03/03/1946   Medicare Observation Status Notification Given:  Yes    Darrold SpanWebster, Signora Zucco Hall, RN 02/03/2016, 2:56 PM

## 2016-02-03 NOTE — H&P (View-Only) (Signed)
CARDIOLOGY CONSULT NOTE   Patient ID: Troy Jackson MRN: [id number] DOB/AGE: 1945/07/05 70 y.o.  Admit date: 02/03/2016  Primary Physician   Ezequiel Kayser, MD Primary Cardiologist   New (Previously seen by Dr. [place] in 2012) Reason for Consultation   Chest pain Requesting Physician  Dr. Sunnie Nielsen  HPI: Troy Jackson is a 70 y.o. male with a history of  Hyperlipidemia (prescribed statin however not taking), COPD, GERD and going tobacco smoking who presented to the ER by EMS for evaluation of chest pain.   He is retired IT sales professional. He was seen by Dr. [place]and in 2004 once for chest pain. Stress test was normal at that time. Last seen by Dr. [place] in 2012 for risk stratification of cardiac etiology. No intervention done. Ddvised cessation of tobacco smoking.  Patient has a ongoing history of tobacco smoking for greater than 55 years. He is currently smokes one pack a day. Last night while laying patient had a sudden episode of substernal "heavy acid reflux and elephant sitting on chest". Patient denies associated shortness of breath, diaphoresis, nausea or vomiting. No radiation of pain. Denies palpitation, orthopnea, PND, syncope, abdominal pain, or lower extremity edema. Pain lasted for 1 hour and EMS was called. He was given one sublingual nitroglycerin with minimal improvement. His symptoms resolved after receiving SL x 2 in ER. He was started on IV heparin and pantoprazole and admitted. No recurrence of chest pain since then.   EKG shows normal sinus rhythm with nonspecific T-wave in anterior lateral lead. Troponin 0.03 --> 0.06. Chest x-ray shows chronic bronchitis and emphysematous changes. Echocardiogram done today shows left ventricular function of 60-65%, no wall motion abnormality, grade 2 diastolic dysfunction, mild dilated LA, mild dilated RA.   Past Medical History:  Diagnosis Date  . Anxiety   . COPD (chronic obstructive pulmonary disease) (HCC)   . GERD (gastroesophageal  reflux disease)   . Hyperlipidemia   . Tobacco abuse      Past Surgical History:  Procedure Laterality Date  . SHOULDER SURGERY     right as child  . SHOULDER SURGERY     left as child    Allergies  Allergen Reactions  . Crestor [Rosuvastatin Calcium] Other (See Comments)    All statins: Myalgias     I have reviewed the patient's current medications . aspirin EC  325 mg Oral Daily  . mometasone-formoterol  2 puff Inhalation BID  . pantoprazole  40 mg Oral Daily   . heparin 1,050 Units/hr (02/03/16 1209)   acetaminophen, albuterol, ALPRAZolam, nitroGLYCERIN, ondansetron (ZOFRAN) IV  Prior to Admission medications   Medication Sig Start Date End Date Taking? Authorizing Provider  albuterol (PROVENTIL HFA;VENTOLIN HFA) 108 (90 Base) MCG/ACT inhaler Inhale 1-2 puffs into the lungs every 6 (six) hours as needed for wheezing or shortness of breath.   Yes Historical Provider, MD  ALPRAZolam Prudy Feeler) 0.5 MG tablet Take 0.5 mg by mouth 3 (three) times daily as needed for anxiety. For anxiety 01/13/11  Yes Historical Provider, MD  budesonide-formoterol (SYMBICORT) 160-4.5 MCG/ACT inhaler Inhale 2 puffs into the lungs 2 (two) times daily.   Yes Historical Provider, MD  Fluticasone-Salmeterol (ADVAIR) 250-50 MCG/DOSE AEPB Inhale 1 puff into the lungs 2 (two) times daily.   Yes Historical Provider, MD  omeprazole (PRILOSEC) 20 MG capsule Take 20 mg by mouth 2 (two) times daily before a meal.   Yes Historical Provider, MD  HYDROcodone-acetaminophen (NORCO/VICODIN) 5-325 MG per tablet Take 2 tablets by mouth every  4 (four) hours as needed. Patient not taking: Reported on 02/03/2016 09/23/12   Elson AreasLeslie K Sofia, PA-C     Social History   Social History  . Marital status: Married    Spouse name: N/A  . Number of children: 2  . Years of education: N/A   Occupational History  . plumbing [employer]   Social History Main Topics  . Smoking status: Current Some Day Smoker     Packs/day: 1.00    Types: Cigarettes  . Smokeless tobacco: Former NeurosurgeonUser  . Alcohol use Yes     Comment: occasional   . Drug use: No  . Sexual activity: Not on file   Other Topics Concern  . Not on file   Social History Narrative  . No narrative on file    Family Status  Relation Status  . Father Deceased at age 70   heart attack  . Mother Deceased at age 70   Alzheimer's disease  . Brother Deceased at age 70   Alcohol abuse  . Brother Alive  . Paternal Grandfather   . Brother    Family History  Problem Relation Age of Onset  . Heart disease Father   . Lung cancer Father   . Heart attack Father   . Alcohol abuse Father   . Alzheimer's disease Mother   . Heart attack Paternal Grandfather   . Alcohol abuse Paternal Grandfather   . Alcohol abuse Brother      ROS:  Full 14 point review of systems complete and found to be negative unless listed above.  Physical Exam: Blood pressure 130/75, pulse 66, temperature 98 F (36.7 C), temperature source Oral, resp. rate 18, height 6\' 1"  (1.854 m), weight 194 lb 1.6 oz (88 kg), SpO2 95 %.  General: Well developed, well nourished, male in no acute distress Head: Eyes PERRLA, No xanthomas. Normocephalic and atraumatic, oropharynx without edema or exudate.  Lungs: Resp regular and unlabored, Scattered diffuse wheezing. Heart: RRR no s3, s4, or murmurs..   Neck: No carotid bruits. No lymphadenopathy.  JVD. Abdomen: Bowel sounds present, abdomen soft and non-tender without masses or hernias noted. Msk:  No spine or cva tenderness. No weakness, no joint deformities or effusions. Extremities: No clubbing, cyanosis or edema. DP/PT/Radials 2+ and equal bilaterally. Neuro: Alert and oriented X 3. No focal deficits noted. Psych:  Good affect, responds appropriately Skin: No rashes or lesions noted.  Labs:   Lab Results  Component Value Date   WBC 5.6 02/03/2016   HGB 15.0 02/03/2016   HCT 43.7 02/03/2016   MCV 96.5 02/03/2016    PLT 230 02/03/2016   No results for input(s): INR in the last 72 hours.   Recent Labs Lab 02/03/16 0152  NA 140  K 4.2  CL 105  CO2 28  BUN 12  CREATININE 1.07  CALCIUM 8.7*  GLUCOSE 120*   No results found for: MG  Recent Labs  02/03/16 0528 02/03/16 1005  TROPONINI 0.03* 0.06*    Recent Labs  02/03/16 0158  TROPIPOC 0.00   No results found for: PROBNP No results found for: CHOL, HDL, LDLCALC, TRIG No results found for: DDIMER No results found for: LIPASE, AMYLASE No results found for: TSH, T4TOTAL, T3FREE, THYROIDAB No results found for: VITAMINB12, FOLATE, FERRITIN, TIBC, IRON, RETICCTPCT  Echo: 02/03/16 LV EF: 60% -   65%  ------------------------------------------------------------------- Indications:      Chest pain 786.51.  ------------------------------------------------------------------- History:   PMH:   Chronic obstructive  pulmonary disease.  Risk factors:  Current tobacco use. Dyslipidemia.  ------------------------------------------------------------------- Study Conclusions  - Left ventricle: The cavity size was normal. Wall thickness was   normal. Systolic function was normal. The estimated ejection   fraction was in the range of 60% to 65%. Wall motion was normal;   there were no regional wall motion abnormalities. Features are   consistent with a pseudonormal left ventricular filling pattern,   with concomitant abnormal relaxation and increased filling   pressure (grade 2 diastolic dysfunction). - Aortic valve: Mildly calcified annulus. - Left atrium: The atrium was mildly dilated. - Right atrium: The atrium was mildly dilated. - Pericardium, extracardiac: A trivial pericardial effusion was   identified.    Radiology:  Dg Chest Portable 1 View  Result Date: 02/03/2016 CLINICAL DATA:  Mid chest pain tonight EXAM: PORTABLE CHEST 1 VIEW COMPARISON:  None. FINDINGS: Normal heart size and pulmonary vascularity. Emphysematous  changes in the lungs. Peribronchial thickening with central interstitial changes likely representing chronic bronchitis. No focal airspace disease or consolidation. No blunting of costophrenic angles. No pneumothorax. Old right rib fractures. Degenerative changes in the spine. Calcification of the aorta. IMPRESSION: Emphysematous and chronic bronchitic changes in the lungs. No evidence of active pulmonary disease. Electronically Signed   By: Burman NievesWilliam  Stevens M.D.   On: 02/03/2016 02:26    ASSESSMENT AND PLAN:     1. Chest pain - Concerning for anginal pain. Also complained of GERD. Responsive to sublingual nitroglycerin. EKG with nonspecific changes. Troponin 0.03-->0.06. Continue aspirin and IV heparin for now. Strong family hx of CAD. He is NPO. Will review with MD. Cath vs stress test.   2. Hyperlipidemia - On statin previously. He self discontinued and non interested in restarting.   3. Tobacco abuse - > 55 pack year. No interested in quitting. Advised importance of cessation.   4. COPD (chronic obstructive pulmonary disease) (HCC) - Diffuse wheezing noted. Per primary.    SignedManson Passey: Bhagat,Bhavinkumar, PA 02/03/2016, 1:32 PM Pager [phone]  Co-Sign MD  History and all data above reviewed.  Patient examined.  I agree with the findings as above.  Chest pain at rest consistent with unstable angina.  Significant cardiovascular risk factors.  Positive mild troponin elevation.  3The patient exam reveals COR:RRR  ,  Lungs: Clear  ,  Abd: Positive bowel sounds, no rebound no guarding, Ext No edema  .  All available labs, radiology testing, previous records reviewed. Agree with documented assessment and plan. UNSTABLE ANGINA:  Cath today.  The patient understands that risks included but are not limited to stroke (1 in 1000), death (1 in 1000), kidney failure [usually temporary] (1 in 500), bleeding (1 in 200), allergic reaction [possibly serious] (1 in 200).  The patient understands and agrees to  proceed.    Risk reduction:  He does not want to consider a stating or to stop smoking but I will continue to discuss this with him.    Fayrene FearingJames Gregoria Selvy  2:21 PM  02/03/2016

## 2016-02-03 NOTE — H&P (Signed)
History and Physical    Troy Jackson ZOX:[id number] DOB: 07/13/1945 DOA: 02/03/2016  PCP: Ezequiel Kayser, MD  Patient coming from: Home.  Chief Complaint: Chest pain.  HPI: Troy Jackson is a 70 y.o. male with COPD, ongoing tobacco abuse, hyperlipidemia presents to the ER because of chest pain. Last night around 10 PM while on the bed patient started experiencing chest pressure nonradiating with no associated diaphoresis or palpitations nausea or vomiting. Pain persisted and patient called EMS. EMS on arrival gave sublingual nitroglycerin following which patient's chest pain improved but did not resolve. In the ER patient was given another dose of sublingual nitroglycerin following which patient's chest pain resolved. EKG was showing nonspecific findings troponin was negative chest x-ray was showing nothing acute. Patient is being admitted for chest pain workup.   ED Course: EKG cardiac markers and chest x-ray were unremarkable. Sublingual nitroglycerin was given following which his pain improved. Patient was started on heparin.  Review of Systems: As per HPI, rest all negative.   Past Medical History:  Diagnosis Date  . Anxiety   . GERD (gastroesophageal reflux disease)   . Hyperlipidemia     Past Surgical History:  Procedure Laterality Date  . SHOULDER SURGERY     right as child  . SHOULDER SURGERY     left as child     reports that he has been smoking Cigarettes.  He has been smoking about 1.00 pack per day. He has quit using smokeless tobacco. He reports that he drinks alcohol. He reports that he does not use drugs.  Allergies  Allergen Reactions  . Crestor [Rosuvastatin Calcium] Other (See Comments)    All statins: Myalgias     Family History  Problem Relation Age of Onset  . Heart disease Father   . Lung cancer Father   . Heart attack Father   . Alcohol abuse Father   . Alzheimer's disease Mother   . Heart attack Paternal Grandfather   . Alcohol abuse  Paternal Grandfather   . Alcohol abuse Brother     Prior to Admission medications   Medication Sig Start Date End Date Taking? Authorizing Provider  albuterol (PROVENTIL HFA;VENTOLIN HFA) 108 (90 Base) MCG/ACT inhaler Inhale 1-2 puffs into the lungs every 6 (six) hours as needed for wheezing or shortness of breath.   Yes Historical Provider, MD  ALPRAZolam Prudy Feeler) 0.5 MG tablet Take 0.5 mg by mouth 3 (three) times daily as needed for anxiety. For anxiety 01/13/11  Yes Historical Provider, MD  budesonide-formoterol (SYMBICORT) 160-4.5 MCG/ACT inhaler Inhale 2 puffs into the lungs 2 (two) times daily.   Yes Historical Provider, MD  Fluticasone-Salmeterol (ADVAIR) 250-50 MCG/DOSE AEPB Inhale 1 puff into the lungs 2 (two) times daily.   Yes Historical Provider, MD  omeprazole (PRILOSEC) 20 MG capsule Take 20 mg by mouth 2 (two) times daily before a meal.   Yes Historical Provider, MD  HYDROcodone-acetaminophen (NORCO/VICODIN) 5-325 MG per tablet Take 2 tablets by mouth every 4 (four) hours as needed. Patient not taking: Reported on 02/03/2016 09/23/12   Elson Areas, [place]    Physical Exam: Vitals:   02/03/16 0300 02/03/16 0315 02/03/16 0330 02/03/16 0405  BP: 117/70 112/63 102/68 (!) 117/59  Pulse: 71 74 71 71  Resp: 16 12 22 20   Temp:    97.9 F (36.6 C)  TempSrc:    Oral  SpO2: 94% 95% 96% 95%  Weight:      Height:  Constitutional: Moderately built and nourished. Vitals:   02/03/16 0300 02/03/16 0315 02/03/16 0330 02/03/16 0405  BP: 117/70 112/63 102/68 (!) 117/59  Pulse: 71 74 71 71  Resp: 16 12 22 20   Temp:    97.9 F (36.6 C)  TempSrc:    Oral  SpO2: 94% 95% 96% 95%  Weight:      Height:       Eyes: Anicteric. No pallor. ENMT: No discharge from the ears eyes nose or mouth. Neck: No mass felt. No JVD appreciated. Respiratory: Bilateral wheezes present. No crepitations. Cardiovascular: S1 and S2 heard. No murmurs appreciated. Abdomen: Soft nontender bowel  sounds present. Musculoskeletal: No edema. Skin: No rash. Neurologic: Alert awake oriented to time place and person. Moves all extremities. Psychiatric: Appears normal. Normal affect.   Labs on Admission: I have personally reviewed following labs and imaging studies  CBC:  Recent Labs Lab 02/03/16 0152  WBC 5.6  HGB 15.0  HCT 43.7  MCV 96.5  PLT 230   Basic Metabolic Panel:  Recent Labs Lab 02/03/16 0152  NA 140  K 4.2  CL 105  CO2 28  GLUCOSE 120*  BUN 12  CREATININE 1.07  CALCIUM 8.7*   GFR: Estimated Creatinine Clearance: 72.6 mL/min (by C-G formula based on SCr of 1.07 mg/dL). Liver Function Tests: No results for input(s): AST, ALT, ALKPHOS, BILITOT, PROT, ALBUMIN in the last 168 hours. No results for input(s): LIPASE, AMYLASE in the last 168 hours. No results for input(s): AMMONIA in the last 168 hours. Coagulation Profile: No results for input(s): INR, PROTIME in the last 168 hours. Cardiac Enzymes: No results for input(s): CKTOTAL, CKMB, CKMBINDEX, TROPONINI in the last 168 hours. BNP (last 3 results) No results for input(s): PROBNP in the last 8760 hours. HbA1C: No results for input(s): HGBA1C in the last 72 hours. CBG: No results for input(s): GLUCAP in the last 168 hours. Lipid Profile: No results for input(s): CHOL, HDL, LDLCALC, TRIG, CHOLHDL, LDLDIRECT in the last 72 hours. Thyroid Function Tests: No results for input(s): TSH, T4TOTAL, FREET4, T3FREE, THYROIDAB in the last 72 hours. Anemia Panel: No results for input(s): VITAMINB12, FOLATE, FERRITIN, TIBC, IRON, RETICCTPCT in the last 72 hours. Urine analysis: No results found for: COLORURINE, APPEARANCEUR, LABSPEC, PHURINE, GLUCOSEU, HGBUR, BILIRUBINUR, KETONESUR, PROTEINUR, UROBILINOGEN, NITRITE, LEUKOCYTESUR Sepsis Labs: @LABRCNTIP (procalcitonin:4,lacticidven:4) )No results found for this or any previous visit (from the past 240 hour(s)).   Radiological Exams on Admission: Dg Chest  Portable 1 View  Result Date: 02/03/2016 CLINICAL DATA:  Mid chest pain tonight EXAM: PORTABLE CHEST 1 VIEW COMPARISON:  None. FINDINGS: Normal heart size and pulmonary vascularity. Emphysematous changes in the lungs. Peribronchial thickening with central interstitial changes likely representing chronic bronchitis. No focal airspace disease or consolidation. No blunting of costophrenic angles. No pneumothorax. Old right rib fractures. Degenerative changes in the spine. Calcification of the aorta. IMPRESSION: Emphysematous and chronic bronchitic changes in the lungs. No evidence of active pulmonary disease. Electronically Signed   By: Burman NievesWilliam  Stevens M.D.   On: 02/03/2016 02:26    EKG: Independently reviewed. Normal sinus rhythm with nonspecific ST changes.  Assessment/Plan Principal Problem:   Chest pain Active Problems:   Hyperlipidemia   COPD (chronic obstructive pulmonary disease) (HCC)    1. Chest pain - patient chest pain is concerning for angina. Patient was placed on heparin in the ER. Chest pain improved with sublingual nitroglycerin. We will cycle cardiac markers keep patient nothing by mouth in anticipation of cardiac procedure check 2-D  echo. Consult cardiology in a.m. Patient is on aspirin. 2. COPD - on exam patient is wheezing which patient states is chronic. Continue home inhalers. 3. Hyperlipidemia - patient states he takes only statins randomly. 4. Tobacco abuse - tobacco cessation counseling requested.   DVT prophylaxis: Lovenox. Code Status: Full code.  Family Communication: Discussed with patient.  Disposition Plan: Home.  Consults called: None.  Admission status: Observation.    Eduard ClosKAKRAKANDY,Aleathia Purdy N. MD [facility] Pager [phone].  If 7PM-7AM, please contact night-coverage [email or web address] Password [email or web address]  02/03/2016, 4:53 AM

## 2016-02-03 NOTE — Progress Notes (Signed)
Patient refused to let me prep his groin site for catheterization.  Minerva Endsiffany N Leonte Horrigan

## 2016-02-03 NOTE — Interval H&P Note (Signed)
History and Physical Interval Note:  02/03/2016 4:05 PM  Troy Jackson  has presented today for surgery, with the diagnosis of unstable angina  The various methods of treatment have been discussed with the patient and family. After consideration of risks, benefits and other options for treatment, the patient has consented to  Procedure(s): Left Heart Cath and Coronary Angiography (N/A) as a surgical intervention .  The patient's history has been reviewed, patient examined, no change in status, stable for surgery.  I have reviewed the patient's chart and labs.  Questions were answered to the patient's satisfaction.   Cath Lab Visit (complete for each Cath Lab visit)  Clinical Evaluation Leading to the Procedure:   ACS: Yes.    Non-ACS:    Anginal Classification: CCS IV  Anti-ischemic medical therapy: No Therapy  Non-Invasive Test Results: No non-invasive testing performed  Prior CABG: No previous CABG        Theron Aristaeter [facility] PLLCJordanMD,FACC 02/03/2016 4:06 PM

## 2016-02-03 NOTE — Progress Notes (Signed)
ANTICOAGULATION CONSULT NOTE - Initial Consult  Pharmacy Consult for heparin Indication: chest pain/ACS  Allergies  Allergen Reactions  . Crestor [Rosuvastatin Calcium] Other (See Comments)    All statins: Myalgias     Patient Measurements: Height: 6\' 1"  (185.4 cm) Weight: 190 lb (86.2 kg) IBW/kg (Calculated) : 79.9  Vital Signs: Temp: 97.7 F (36.5 C) (11/22 0153) Temp Source: Oral (11/22 0153) BP: 107/70 (11/22 0230) Pulse Rate: 74 (11/22 0230)  Labs:  Recent Labs  02/03/16 0152  HGB 15.0  HCT 43.7  PLT 230     Medical History: Past Medical History:  Diagnosis Date  . Anxiety   . GERD (gastroesophageal reflux disease)   . Hyperlipidemia     Assessment: 70yo male c/o awakening from sleep w/ 8/10 CP, relieved w/ NTG, istat troponin negative, to begin heparin.  Goal of Therapy:  Heparin level 0.3-0.7 units/ml Monitor platelets by anticoagulation protocol: Yes   Plan:  Will give heparin 4000 units IV bolus x1 followed by gtt at 1000 units/hr and monitor heparin levels and CBC.  Vernard GamblesVeronda Evaluna Utke, PharmD, BCPS  02/03/2016,2:52 AM

## 2016-02-03 NOTE — Consult Note (Signed)
CARDIOLOGY CONSULT NOTE   Patient ID: Troy Jackson MRN: [id number] DOB/AGE: 1945/07/05 70 y.o.  Admit date: 02/03/2016  Primary Physician   Ezequiel Kayser, MD Primary Cardiologist   New (Previously seen by Dr. [place] in 2012) Reason for Consultation   Chest pain Requesting Physician  Dr. Sunnie Nielsen  HPI: Troy Jackson is a 70 y.o. male with a history of  Hyperlipidemia (prescribed statin however not taking), COPD, GERD and going tobacco smoking who presented to the ER by EMS for evaluation of chest pain.   He is retired IT sales professional. He was seen by Dr. [place]and in 2004 once for chest pain. Stress test was normal at that time. Last seen by Dr. [place] in 2012 for risk stratification of cardiac etiology. No intervention done. Ddvised cessation of tobacco smoking.  Patient has a ongoing history of tobacco smoking for greater than 55 years. He is currently smokes one pack a day. Last night while laying patient had a sudden episode of substernal "heavy acid reflux and elephant sitting on chest". Patient denies associated shortness of breath, diaphoresis, nausea or vomiting. No radiation of pain. Denies palpitation, orthopnea, PND, syncope, abdominal pain, or lower extremity edema. Pain lasted for 1 hour and EMS was called. He was given one sublingual nitroglycerin with minimal improvement. His symptoms resolved after receiving SL x 2 in ER. He was started on IV heparin and pantoprazole and admitted. No recurrence of chest pain since then.   EKG shows normal sinus rhythm with nonspecific T-wave in anterior lateral lead. Troponin 0.03 --> 0.06. Chest x-ray shows chronic bronchitis and emphysematous changes. Echocardiogram done today shows left ventricular function of 60-65%, no wall motion abnormality, grade 2 diastolic dysfunction, mild dilated LA, mild dilated RA.   Past Medical History:  Diagnosis Date  . Anxiety   . COPD (chronic obstructive pulmonary disease) (HCC)   . GERD (gastroesophageal  reflux disease)   . Hyperlipidemia   . Tobacco abuse      Past Surgical History:  Procedure Laterality Date  . SHOULDER SURGERY     right as child  . SHOULDER SURGERY     left as child    Allergies  Allergen Reactions  . Crestor [Rosuvastatin Calcium] Other (See Comments)    All statins: Myalgias     I have reviewed the patient's current medications . aspirin EC  325 mg Oral Daily  . mometasone-formoterol  2 puff Inhalation BID  . pantoprazole  40 mg Oral Daily   . heparin 1,050 Units/hr (02/03/16 1209)   acetaminophen, albuterol, ALPRAZolam, nitroGLYCERIN, ondansetron (ZOFRAN) IV  Prior to Admission medications   Medication Sig Start Date End Date Taking? Authorizing Provider  albuterol (PROVENTIL HFA;VENTOLIN HFA) 108 (90 Base) MCG/ACT inhaler Inhale 1-2 puffs into the lungs every 6 (six) hours as needed for wheezing or shortness of breath.   Yes Historical Provider, MD  ALPRAZolam Prudy Feeler) 0.5 MG tablet Take 0.5 mg by mouth 3 (three) times daily as needed for anxiety. For anxiety 01/13/11  Yes Historical Provider, MD  budesonide-formoterol (SYMBICORT) 160-4.5 MCG/ACT inhaler Inhale 2 puffs into the lungs 2 (two) times daily.   Yes Historical Provider, MD  Fluticasone-Salmeterol (ADVAIR) 250-50 MCG/DOSE AEPB Inhale 1 puff into the lungs 2 (two) times daily.   Yes Historical Provider, MD  omeprazole (PRILOSEC) 20 MG capsule Take 20 mg by mouth 2 (two) times daily before a meal.   Yes Historical Provider, MD  HYDROcodone-acetaminophen (NORCO/VICODIN) 5-325 MG per tablet Take 2 tablets by mouth every  4 (four) hours as needed. Patient not taking: Reported on 02/03/2016 09/23/12   Elson AreasLeslie K Sofia, PA-C     Social History   Social History  . Marital status: Married    Spouse name: N/A  . Number of children: 2  . Years of education: N/A   Occupational History  . plumbing [employer]   Social History Main Topics  . Smoking status: Current Some Day Smoker     Packs/day: 1.00    Types: Cigarettes  . Smokeless tobacco: Former NeurosurgeonUser  . Alcohol use Yes     Comment: occasional   . Drug use: No  . Sexual activity: Not on file   Other Topics Concern  . Not on file   Social History Narrative  . No narrative on file    Family Status  Relation Status  . Father Deceased at age 70   heart attack  . Mother Deceased at age 70   Alzheimer's disease  . Brother Deceased at age 70   Alcohol abuse  . Brother Alive  . Paternal Grandfather   . Brother    Family History  Problem Relation Age of Onset  . Heart disease Father   . Lung cancer Father   . Heart attack Father   . Alcohol abuse Father   . Alzheimer's disease Mother   . Heart attack Paternal Grandfather   . Alcohol abuse Paternal Grandfather   . Alcohol abuse Brother      ROS:  Full 14 point review of systems complete and found to be negative unless listed above.  Physical Exam: Blood pressure 130/75, pulse 66, temperature 98 F (36.7 C), temperature source Oral, resp. rate 18, height 6\' 1"  (1.854 m), weight 194 lb 1.6 oz (88 kg), SpO2 95 %.  General: Well developed, well nourished, male in no acute distress Head: Eyes PERRLA, No xanthomas. Normocephalic and atraumatic, oropharynx without edema or exudate.  Lungs: Resp regular and unlabored, Scattered diffuse wheezing. Heart: RRR no s3, s4, or murmurs..   Neck: No carotid bruits. No lymphadenopathy.  JVD. Abdomen: Bowel sounds present, abdomen soft and non-tender without masses or hernias noted. Msk:  No spine or cva tenderness. No weakness, no joint deformities or effusions. Extremities: No clubbing, cyanosis or edema. DP/PT/Radials 2+ and equal bilaterally. Neuro: Alert and oriented X 3. No focal deficits noted. Psych:  Good affect, responds appropriately Skin: No rashes or lesions noted.  Labs:   Lab Results  Component Value Date   WBC 5.6 02/03/2016   HGB 15.0 02/03/2016   HCT 43.7 02/03/2016   MCV 96.5 02/03/2016    PLT 230 02/03/2016   No results for input(s): INR in the last 72 hours.   Recent Labs Lab 02/03/16 0152  NA 140  K 4.2  CL 105  CO2 28  BUN 12  CREATININE 1.07  CALCIUM 8.7*  GLUCOSE 120*   No results found for: MG  Recent Labs  02/03/16 0528 02/03/16 1005  TROPONINI 0.03* 0.06*    Recent Labs  02/03/16 0158  TROPIPOC 0.00   No results found for: PROBNP No results found for: CHOL, HDL, LDLCALC, TRIG No results found for: DDIMER No results found for: LIPASE, AMYLASE No results found for: TSH, T4TOTAL, T3FREE, THYROIDAB No results found for: VITAMINB12, FOLATE, FERRITIN, TIBC, IRON, RETICCTPCT  Echo: 02/03/16 LV EF: 60% -   65%  ------------------------------------------------------------------- Indications:      Chest pain 786.51.  ------------------------------------------------------------------- History:   PMH:   Chronic obstructive  pulmonary disease.  Risk factors:  Current tobacco use. Dyslipidemia.  ------------------------------------------------------------------- Study Conclusions  - Left ventricle: The cavity size was normal. Wall thickness was   normal. Systolic function was normal. The estimated ejection   fraction was in the range of 60% to 65%. Wall motion was normal;   there were no regional wall motion abnormalities. Features are   consistent with a pseudonormal left ventricular filling pattern,   with concomitant abnormal relaxation and increased filling   pressure (grade 2 diastolic dysfunction). - Aortic valve: Mildly calcified annulus. - Left atrium: The atrium was mildly dilated. - Right atrium: The atrium was mildly dilated. - Pericardium, extracardiac: A trivial pericardial effusion was   identified.    Radiology:  Dg Chest Portable 1 View  Result Date: 02/03/2016 CLINICAL DATA:  Mid chest pain tonight EXAM: PORTABLE CHEST 1 VIEW COMPARISON:  None. FINDINGS: Normal heart size and pulmonary vascularity. Emphysematous  changes in the lungs. Peribronchial thickening with central interstitial changes likely representing chronic bronchitis. No focal airspace disease or consolidation. No blunting of costophrenic angles. No pneumothorax. Old right rib fractures. Degenerative changes in the spine. Calcification of the aorta. IMPRESSION: Emphysematous and chronic bronchitic changes in the lungs. No evidence of active pulmonary disease. Electronically Signed   By: Burman NievesWilliam  Stevens M.D.   On: 02/03/2016 02:26    ASSESSMENT AND PLAN:     1. Chest pain - Concerning for anginal pain. Also complained of GERD. Responsive to sublingual nitroglycerin. EKG with nonspecific changes. Troponin 0.03-->0.06. Continue aspirin and IV heparin for now. Strong family hx of CAD. He is NPO. Will review with MD. Cath vs stress test.   2. Hyperlipidemia - On statin previously. He self discontinued and non interested in restarting.   3. Tobacco abuse - > 55 pack year. No interested in quitting. Advised importance of cessation.   4. COPD (chronic obstructive pulmonary disease) (HCC) - Diffuse wheezing noted. Per primary.    SignedManson Passey: Bhagat,Bhavinkumar, PA 02/03/2016, 1:32 PM Pager [phone]  Co-Sign MD  History and all data above reviewed.  Patient examined.  I agree with the findings as above.  Chest pain at rest consistent with unstable angina.  Significant cardiovascular risk factors.  Positive mild troponin elevation.  3The patient exam reveals COR:RRR  ,  Lungs: Clear  ,  Abd: Positive bowel sounds, no rebound no guarding, Ext No edema  .  All available labs, radiology testing, previous records reviewed. Agree with documented assessment and plan. UNSTABLE ANGINA:  Cath today.  The patient understands that risks included but are not limited to stroke (1 in 1000), death (1 in 1000), kidney failure [usually temporary] (1 in 500), bleeding (1 in 200), allergic reaction [possibly serious] (1 in 200).  The patient understands and agrees to  proceed.    Risk reduction:  He does not want to consider a stating or to stop smoking but I will continue to discuss this with him.    Fayrene FearingJames Hattie Pine  2:21 PM  02/03/2016

## 2016-02-03 NOTE — Progress Notes (Signed)
ANTICOAGULATION CONSULT NOTE - FOLLOW UP  Pharmacy Consult:  Hheparin Indication: chest pain/ACS  Allergies  Allergen Reactions  . Crestor [Rosuvastatin Calcium] Other (See Comments)    All statins: Myalgias     Patient Measurements: Height: 6\' 1"  (185.4 cm) Weight: 194 lb 1.6 oz (88 kg) IBW/kg (Calculated) : 79.9  Vital Signs: Temp: 97.9 F (36.6 C) (11/22 0405) Temp Source: Oral (11/22 0405) BP: 117/59 (11/22 0405) Pulse Rate: 71 (11/22 0405)  Labs:  Recent Labs  02/03/16 0152 02/03/16 0528 02/03/16 1005  HGB 15.0  --   --   HCT 43.7  --   --   PLT 230  --   --   HEPARINUNFRC  --   --  0.37  CREATININE 1.07  --   --   TROPONINI  --  0.03*  --       Assessment: 70 YOM c/o awakening from sleep with 8/10 CP, relieved with NTG.  Patient continues on IV heparin and heparin level is therapeutic.  No bleeding reported.   Goal of Therapy:  Heparin level 0.3-0.7 units/ml Monitor platelets by anticoagulation protocol: Yes    Plan:  - Increase heparin gtt slightly to 1050 units/hr - Daily heparin level and CBC - F/U Cards consult, ECHO   Lasean Rahming D. Laney Potashang, PharmD, BCPS Pager:  [phone] 02/03/2016, 11:27 AM

## 2016-02-03 NOTE — ED Provider Notes (Signed)
[facility]-EMERGENCY DEPT Provider Note   CSN: [id number] Arrival date & time: 02/03/16  0140   By signing my name below, I, Nelwyn SalisburyJoshua Fowler, attest that this documentation has been prepared under the direction and in the presence of Shon Batonourtney F Horton, MD . Electronically Signed: Nelwyn SalisburyJoshua Fowler, Scribe. 02/03/2016. 1:59 AM.  History   Chief Complaint Chief Complaint  Patient presents with  . Chest Pain   The history is provided by the patient. No language interpreter was used.    HPI Comments:  Troy Jackson is a 70 y.o. male brought in by EMS, who presents to the Emergency Department complaining of sudden-onset constant gradually resolved left-sided chest pain beginning about 3.5 hours ago. Pt describes his pain as beginning as an 8/10 burning pain, which turned into a pressure-like sensation. He states "it felt like an elephant on his chest." He states his current pain is down to 2/10. Per EMS, pt was given nitroglycerin and aspirin which gave him mild relief.. Pt denies any nausea, vomiting or diaphoresis. He also reports shortness of breath, but states that is baseline for him. Pt is an everyday smoker.      Past Medical History:  Diagnosis Date  . Anxiety   . GERD (gastroesophageal reflux disease)   . Hyperlipidemia     Patient Active Problem List   Diagnosis Date Noted  . Hyperlipidemia 01/31/2011  . Tobacco abuse 01/31/2011    Past Surgical History:  Procedure Laterality Date  . SHOULDER SURGERY     right as child  . SHOULDER SURGERY     left as child       Home Medications    Prior to Admission medications   Medication Sig Start Date End Date Taking? Authorizing Provider  albuterol (PROVENTIL HFA;VENTOLIN HFA) 108 (90 Base) MCG/ACT inhaler Inhale 1-2 puffs into the lungs every 6 (six) hours as needed for wheezing or shortness of breath.   Yes Historical Provider, MD  ALPRAZolam Prudy Feeler(XANAX) 0.5 MG tablet Take 0.5 mg by mouth 3 (three) times daily as needed for  anxiety. For anxiety 01/13/11  Yes Historical Provider, MD  budesonide-formoterol (SYMBICORT) 160-4.5 MCG/ACT inhaler Inhale 2 puffs into the lungs 2 (two) times daily.   Yes Historical Provider, MD  Fluticasone-Salmeterol (ADVAIR) 250-50 MCG/DOSE AEPB Inhale 1 puff into the lungs 2 (two) times daily.   Yes Historical Provider, MD  omeprazole (PRILOSEC) 20 MG capsule Take 20 mg by mouth 2 (two) times daily before a meal.   Yes Historical Provider, MD  HYDROcodone-acetaminophen (NORCO/VICODIN) 5-325 MG per tablet Take 2 tablets by mouth every 4 (four) hours as needed. Patient not taking: Reported on 02/03/2016 09/23/12   Elson AreasLeslie K Sofia, PA-C    Family History Family History  Problem Relation Age of Onset  . Heart disease Father   . Lung cancer Father   . Heart attack Father   . Alcohol abuse Father   . Alzheimer's disease Mother   . Heart attack Paternal Grandfather   . Alcohol abuse Paternal Grandfather   . Alcohol abuse Brother     Social History Social History  Substance Use Topics  . Smoking status: Current Some Day Smoker    Packs/day: 1.00    Types: Cigarettes  . Smokeless tobacco: Former NeurosurgeonUser  . Alcohol use Yes     Comment: occasional      Allergies   Crestor [rosuvastatin calcium]   Review of Systems Review of Systems  Constitutional: Negative for diaphoresis.  Respiratory: Negative for shortness of  breath.   Cardiovascular: Positive for chest pain. Negative for leg swelling.  Gastrointestinal: Negative for nausea and vomiting.  Neurological: Negative for headaches.  All other systems reviewed and are negative.    Physical Exam Updated Vital Signs BP 107/70   Pulse 74   Temp 97.7 F (36.5 C) (Oral)   Resp 15   Wt 190 lb (86.2 kg)   SpO2 95%   BMI 25.07 kg/m   Physical Exam  Constitutional: He is oriented to person, place, and time. He appears well-developed and well-nourished. No distress.  HENT:  Head: Normocephalic and atraumatic.    Cardiovascular: Normal rate, regular rhythm and normal heart sounds.   No murmur heard. Pulmonary/Chest: Effort normal and breath sounds normal. No respiratory distress. He has no wheezes.  Abdominal: Soft. Bowel sounds are normal. There is no tenderness. There is no rebound.  Musculoskeletal: He exhibits no edema.  Neurological: He is alert and oriented to person, place, and time.  Skin: Skin is warm and dry.  Psychiatric: He has a normal mood and affect.  Nursing note and vitals reviewed.    ED Treatments / Results  DIAGNOSTIC STUDIES:  Oxygen Saturation is 96% on RA, adequate by my interpretation.    COORDINATION OF CARE:  2:02 AM Discussed treatment plan with pt at bedside which includes EKG, lab work, and imaging and pt agreed to plan.  Labs (all labs ordered are listed, but only abnormal results are displayed) Labs Reviewed  CBC  BASIC METABOLIC PANEL  I-STAT TROPOININ, ED    EKG  EKG Interpretation  Date/Time:  Wednesday February 03 2016 01:49:05 EST Ventricular Rate:  71 PR Interval:    QRS Duration: 78 QT Interval:  408 QTC Calculation: 444 R Axis:   68 Text Interpretation:  Sinus rhythm Nonspecific T abnrm, anterolateral leads ST elevation, consider inferior injury NO reciprocal changes Confirmed by HORTON  MD, COURTNEY ([id number]) on 02/03/2016 2:19:32 AM       EKG Interpretation  Date/Time:  Wednesday February 03 2016 02:12:35 EST Ventricular Rate:  80 PR Interval:    QRS Duration: 86 QT Interval:  401 QTC Calculation: 463 R Axis:   74 Text Interpretation:  Sinus rhythm Nonspecific T abnrm, anterolateral leads Minimal ST elevation, inferior leads No dynamic changes Confirmed by Wilkie AyeHORTON  MD, COURTNEY ([id number]) on 02/03/2016 2:51:32 AM        Radiology Dg Chest Portable 1 View  Result Date: 02/03/2016 CLINICAL DATA:  Mid chest pain tonight EXAM: PORTABLE CHEST 1 VIEW COMPARISON:  None. FINDINGS: Normal heart size and pulmonary vascularity.  Emphysematous changes in the lungs. Peribronchial thickening with central interstitial changes likely representing chronic bronchitis. No focal airspace disease or consolidation. No blunting of costophrenic angles. No pneumothorax. Old right rib fractures. Degenerative changes in the spine. Calcification of the aorta. IMPRESSION: Emphysematous and chronic bronchitic changes in the lungs. No evidence of active pulmonary disease. Electronically Signed   By: Burman NievesWilliam  Stevens M.D.   On: 02/03/2016 02:26    Procedures Procedures (including critical care time)  Medications Ordered in ED Medications  nitroGLYCERIN (NITROSTAT) SL tablet 0.4 mg (0.4 mg Sublingual Given 02/03/16 0219)  nitroGLYCERIN (NITROGLYN) 2 % ointment 0.5 inch (0.5 inches Topical Given 02/03/16 0228)     Initial Impression / Assessment and Plan / ED Course  I have reviewed the triage vital signs and the nursing notes.  Pertinent labs & imaging results that were available during my care of the patient were reviewed by me and considered in  my medical decision making (see chart for details).  Clinical Course     Patient presents with chest pain. Ongoing for the last 3 hours. Woke him up from sleep. Initially burning and pressure-like. He does have risk factors for ACS including smoking and hyperlipidemia. Heart score is 4. Initial EKG with normal less than 1 mm ST elevation inferiorly. Repeat EKG 10 minutes without dynamic changes. No reciprocal changes. Does not meet STEMI criteria. He was given nitroglycerin and nitroglycerin ointment was applied. Pain is now 0 out of 10. Would be concerned for ACS. Initial troponin is negative. Will initiate heparin drip and admit for formal rule out.   Final Clinical Impressions(s) / ED Diagnoses   Final diagnoses:  Other chest pain    New Prescriptions New Prescriptions   No medications on file   I personally performed the services described in this documentation, which was scribed in  my presence. The recorded information has been reviewed and is accurate.     Shon Baton, MD 02/03/16 [phone]

## 2016-02-03 NOTE — Progress Notes (Signed)
  Echocardiogram 2D Echocardiogram has been performed.  Arvil ChacoFoster, Cobie Leidner 02/03/2016, 10:46 AM

## 2016-02-03 NOTE — Progress Notes (Signed)
PROGRESS NOTE    Troy CavalierWilliam S Jackson  ZOX:[id number] DOB: 04/14/1945 DOA: 02/03/2016 PCP: Ezequiel KayserPERINI,MARK A, MD    Brief Narrative: Troy CavalierWilliam S Jackson is a 70 y.o. male with COPD, ongoing tobacco abuse, hyperlipidemia presents to the ER because of chest pain. Last night around 10 PM while on the bed patient started experiencing chest pressure nonradiating with no associated diaphoresis or palpitations nausea or vomiting. Pain persisted and patient called EMS. EMS on arrival gave sublingual nitroglycerin following which patient's chest pain improved but did not resolve. In the ER patient was given another dose of sublingual nitroglycerin following which patient's chest pain resolved. EKG was showing nonspecific findings troponin was negative chest x-ray was showing nothing acute. Patient is being admitted for chest pain workup.   ED Course: EKG cardiac markers and chest x-ray were unremarkable. Sublingual nitroglycerin was given following which his pain improved. Patient was started on heparin.   Assessment & Plan:   Principal Problem:   Chest pain Active Problems:   Hyperlipidemia   COPD (chronic obstructive pulmonary disease) (HCC)  1-Chest pain, angina.  On heparin.  Troponin 0.03---0.06.  Cardiology consulted, plan for cath today.  On aspirin/  Check lipid panel.   2-HLD; was on statins in the past, he stop taking it.   3-COPD; continue with dulera, albuterol.   4-Tobacco abuse; No interested in quitting. Advised importance of cessation.   DVT prophylaxis: on heparin  Code Status: full code.  Family Communication: care discussed with patient  Disposition Plan: cath today   Consultants:   Cardiology    Procedures: ECHO    Antimicrobials: none   Subjective: He is chest pain free.  He report dyspnea on exertion some times.   Objective: Vitals:   02/03/16 0315 02/03/16 0330 02/03/16 0405 02/03/16 0818  BP: 112/63 102/68 (!) 117/59   Pulse: 74 71 71   Resp: 12 22 20      Temp:   97.9 F (36.6 C)   TempSrc:   Oral   SpO2: 95% 96% 95% 94%  Weight:   88 kg (194 lb 1.6 oz)   Height:   6\' 1"  (1.854 m)    No intake or output data in the 24 hours ending 02/03/16 0946 Filed Weights   02/03/16 0155 02/03/16 0405  Weight: 86.2 kg (190 lb) 88 kg (194 lb 1.6 oz)    Examination:  General exam: Appears calm and comfortable  Respiratory system: Clear to auscultation. Respiratory effort normal. Cardiovascular system: S1 & S2 heard, RRR. No JVD, murmurs, rubs, gallops or clicks. No pedal edema. Gastrointestinal system: Abdomen is nondistended, soft and nontender. No organomegaly or masses felt. Normal bowel sounds heard. Central nervous system: Alert and oriented. No focal neurological deficits. Extremities: Symmetric 5 x 5 power. Skin: No rashes, lesions or ulcers Psychiatry: Judgement and insight appear normal. Mood & affect appropriate.     Data Reviewed: I have personally reviewed following labs and imaging studies  CBC:  Recent Labs Lab 02/03/16 0152  WBC 5.6  HGB 15.0  HCT 43.7  MCV 96.5  PLT 230   Basic Metabolic Panel:  Recent Labs Lab 02/03/16 0152  NA 140  K 4.2  CL 105  CO2 28  GLUCOSE 120*  BUN 12  CREATININE 1.07  CALCIUM 8.7*   GFR: Estimated Creatinine Clearance: 72.6 mL/min (by C-G formula based on SCr of 1.07 mg/dL). Liver Function Tests: No results for input(s): AST, ALT, ALKPHOS, BILITOT, PROT, ALBUMIN in the last 168 hours. No results for  input(s): LIPASE, AMYLASE in the last 168 hours. No results for input(s): AMMONIA in the last 168 hours. Coagulation Profile: No results for input(s): INR, PROTIME in the last 168 hours. Cardiac Enzymes:  Recent Labs Lab 02/03/16 0528  TROPONINI 0.03*   BNP (last 3 results) No results for input(s): PROBNP in the last 8760 hours. HbA1C: No results for input(s): HGBA1C in the last 72 hours. CBG: No results for input(s): GLUCAP in the last 168 hours. Lipid Profile: No  results for input(s): CHOL, HDL, LDLCALC, TRIG, CHOLHDL, LDLDIRECT in the last 72 hours. Thyroid Function Tests: No results for input(s): TSH, T4TOTAL, FREET4, T3FREE, THYROIDAB in the last 72 hours. Anemia Panel: No results for input(s): VITAMINB12, FOLATE, FERRITIN, TIBC, IRON, RETICCTPCT in the last 72 hours. Sepsis Labs: No results for input(s): PROCALCITON, LATICACIDVEN in the last 168 hours.  No results found for this or any previous visit (from the past 240 hour(s)).       Radiology Studies: Dg Chest Portable 1 View  Result Date: 02/03/2016 CLINICAL DATA:  Mid chest pain tonight EXAM: PORTABLE CHEST 1 VIEW COMPARISON:  None. FINDINGS: Normal heart size and pulmonary vascularity. Emphysematous changes in the lungs. Peribronchial thickening with central interstitial changes likely representing chronic bronchitis. No focal airspace disease or consolidation. No blunting of costophrenic angles. No pneumothorax. Old right rib fractures. Degenerative changes in the spine. Calcification of the aorta. IMPRESSION: Emphysematous and chronic bronchitic changes in the lungs. No evidence of active pulmonary disease. Electronically Signed   By: Burman NievesWilliam  Stevens M.D.   On: 02/03/2016 02:26        Scheduled Meds: . aspirin EC  325 mg Oral Daily  . mometasone-formoterol  2 puff Inhalation BID  . pantoprazole  40 mg Oral Daily   Continuous Infusions: . heparin 1,000 Units/hr (02/03/16 0316)     LOS: 0 days    Time spent: 35 minutes    Alba Coryegalado, Talani Brazee A, MD [facility] Pager [phone]  If 7PM-7AM, please contact night-coverage [email or web address] Password [email or web address] 02/03/2016, 9:46 AM

## 2016-02-03 NOTE — Progress Notes (Signed)
TR BAND REMOVAL  LOCATION:    right radial  DEFLATED PER PROTOCOL:    Yes.    TIME BAND OFF / DRESSING APPLIED:    22:15   SITE UPON ARRIVAL:    Level 0  SITE AFTER BAND REMOVAL:    Level 0  CIRCULATION SENSATION AND MOVEMENT:    Within Normal Limits   Yes.    COMMENTS:  Post TR band instructions given. Pt tolerated well. 

## 2016-02-03 NOTE — ED Triage Notes (Signed)
Per EMS: Woke up with CP at 2230 w/ 8/10 pain. Felt like an elephant siting on his chest. No N,V, diaphoresis. Localized CP over left pectoral area. No radiation. No increase of pain with inhalation or expiration. 324 of aspirin, 1 nitro. Pain is intermittent. Nitro reduced pain to 2.

## 2016-02-04 DIAGNOSIS — I214 Non-ST elevation (NSTEMI) myocardial infarction: Secondary | ICD-10-CM

## 2016-02-04 DIAGNOSIS — E784 Other hyperlipidemia: Secondary | ICD-10-CM

## 2016-02-04 DIAGNOSIS — J449 Chronic obstructive pulmonary disease, unspecified: Secondary | ICD-10-CM

## 2016-02-04 LAB — CBC
HCT: 41.6 % (ref 39.0–52.0)
HEMOGLOBIN: 14.1 g/dL (ref 13.0–17.0)
MCH: 32.3 pg (ref 26.0–34.0)
MCHC: 33.9 g/dL (ref 30.0–36.0)
MCV: 95.4 fL (ref 78.0–100.0)
PLATELETS: 194 10*3/uL (ref 150–400)
RBC: 4.36 MIL/uL (ref 4.22–5.81)
RDW: 12.5 % (ref 11.5–15.5)
WBC: 6.2 10*3/uL (ref 4.0–10.5)

## 2016-02-04 LAB — BASIC METABOLIC PANEL
ANION GAP: 5 (ref 5–15)
BUN: 9 mg/dL (ref 6–20)
CALCIUM: 8.3 mg/dL — AB (ref 8.9–10.3)
CO2: 24 mmol/L (ref 22–32)
CREATININE: 0.89 mg/dL (ref 0.61–1.24)
Chloride: 108 mmol/L (ref 101–111)
Glucose, Bld: 98 mg/dL (ref 65–99)
Potassium: 3.6 mmol/L (ref 3.5–5.1)
SODIUM: 137 mmol/L (ref 135–145)

## 2016-02-04 MED ORDER — CLOPIDOGREL BISULFATE 75 MG PO TABS
600.0000 mg | ORAL_TABLET | Freq: Once | ORAL | Status: AC
  Administered 2016-02-04: 08:00:00 600 mg via ORAL
  Filled 2016-02-04: qty 8

## 2016-02-04 MED ORDER — ASPIRIN 81 MG PO CHEW: 81.0000 mg | CHEWABLE_TABLET | Freq: Every day | ORAL | 0 refills | Status: DC

## 2016-02-04 MED ORDER — CLOPIDOGREL BISULFATE 75 MG PO TABS: 75.0000 mg | ORAL_TABLET | Freq: Every day | ORAL | 3 refills | Status: DC

## 2016-02-04 MED ORDER — NITROGLYCERIN 0.4 MG SL SUBL: 0.4000 mg | SUBLINGUAL_TABLET | SUBLINGUAL | 0 refills | Status: DC | PRN

## 2016-02-04 MED ORDER — PANTOPRAZOLE SODIUM 40 MG PO TBEC: 40.0000 mg | DELAYED_RELEASE_TABLET | Freq: Every day | ORAL | 0 refills | Status: DC

## 2016-02-04 MED ORDER — EZETIMIBE-SIMVASTATIN 10-20 MG PO TABS: 1.0000 | ORAL_TABLET | Freq: Every day | ORAL | 0 refills | Status: DC

## 2016-02-04 MED ORDER — EZETIMIBE-SIMVASTATIN 10-20 MG PO TABS: 1.0000 | ORAL_TABLET | Freq: Every day | ORAL | Status: DC

## 2016-02-04 NOTE — Discharge Summary (Signed)
Physician Discharge Summary  BOWDY BAIR OZH:[id number] DOB: 27-Sep-1945 DOA: 02/03/2016  PCP: Ezequiel Kayser, MD  Admit date: 02/03/2016 Discharge date: 02/04/2016  Admitted From: Home  Disposition:  Home.   Recommendations for Outpatient Follow-up:  1. Follow up with PCP in 1-2 weeks 2. Please obtain BMP/CBC in one week 3. Follow with cardiology for further care of CAD    Discharge Condition; Stable.  CODE STATUS: Full code.  Diet recommendation: Heart Healthy  Brief/Interim Summary: Troy Reeder Wilsonis a 70 y.o.malewith COPD, ongoing tobacco abuse, hyperlipidemia presents to the ER because of chest pain. Last night around 10 PM while on the bed patient started experiencing chest pressure nonradiating with no associated diaphoresis or palpitations nausea or vomiting. Pain persisted and patient called EMS. EMS on arrival gave sublingual nitroglycerin followingwhich patient'schest pain improved but did not resolve. In the ER patient was given another dose of sublingual nitroglycerin following which patient'schest pain resolved. EKG was showing nonspecific findings troponin was negative chest x-ray was showing nothing acute. Patient is being admitted for chest pain workup.  ED Course:EKG cardiac markers and chest x-ray were unremarkable. Sublingual nitroglycerin was given following which his pain improved. Patient was started on heparin.   Assessment & Plan: 1-Chest pain, angina.  On heparin.  Troponin 0.03---0.06.  Underwent cath, which showed Prox Cx to Mid Cx lesion, 90 %stenosed. He is S/P Synergy DES stent placement.  On aspirin/  LDL 134. started statin.   2-HLD; he is willing to try simvastatin, ezetimibe.   3-COPD; continue with dulera, albuterol.   4-Tobacco abuse; No interested in quitting. Advised importance of cessation.    Discharge Diagnoses:  Principal Problem:   Chest pain Active Problems:   Hyperlipidemia   COPD (chronic obstructive  pulmonary disease) (HCC)   Unstable angina [facility])    Discharge Instructions  Discharge Instructions    AMB Referral to Cardiac Rehabilitation - Phase II    Complete by:  As directed    Diagnosis:  Coronary Stents   Diet - low sodium heart healthy    Complete by:  As directed    Increase activity slowly    Complete by:  As directed        Medication List    STOP taking these medications   HYDROcodone-acetaminophen 5-325 MG tablet Commonly known as:  NORCO/VICODIN   omeprazole 20 MG capsule Commonly known as:  PRILOSEC Replaced by:  pantoprazole 40 MG tablet     TAKE these medications   albuterol 108 (90 Base) MCG/ACT inhaler Commonly known as:  PROVENTIL HFA;VENTOLIN HFA Inhale 1-2 puffs into the lungs every 6 (six) hours as needed for wheezing or shortness of breath.   ALPRAZolam 0.5 MG tablet Commonly known as:  XANAX Take 0.5 mg by mouth 3 (three) times daily as needed for anxiety. For anxiety   aspirin 81 MG chewable tablet Chew 1 tablet (81 mg total) by mouth daily.   budesonide-formoterol 160-4.5 MCG/ACT inhaler Commonly known as:  SYMBICORT Inhale 2 puffs into the lungs 2 (two) times daily.   clopidogrel 75 MG tablet Commonly known as:  PLAVIX Take 1 tablet (75 mg total) by mouth daily.   ezetimibe-simvastatin 10-20 MG tablet Commonly known as:  VYTORIN Take 1 tablet by mouth daily at 6 PM.   Fluticasone-Salmeterol 250-50 MCG/DOSE Aepb Commonly known as:  ADVAIR Inhale 1 puff into the lungs 2 (two) times daily.   nitroGLYCERIN 0.4 MG SL tablet Commonly known as:  NITROSTAT Place 1 tablet (0.4 mg total)  under the tongue every 5 (five) minutes as needed for chest pain.   pantoprazole 40 MG tablet Commonly known as:  PROTONIX Take 1 tablet (40 mg total) by mouth daily. Start taking on:  02/05/2016 Replaces:  omeprazole 20 MG capsule      Follow-up Information    Peter SwazilandJordan, MD Follow up.   Specialty:  Cardiology Why:  The office will call  you to arrange follow-up. If you do not hear from them in 3 business days, please call the number provided.  Contact information: [address] [place] [address] [phone]          Allergies  Allergen Reactions  . Crestor [Rosuvastatin Calcium] Other (See Comments)    All statins: Myalgias     Consultations:  Cardiology    Procedures/Studies: Dg Chest Portable 1 View  Result Date: 02/03/2016 CLINICAL DATA:  Mid chest pain tonight EXAM: PORTABLE CHEST 1 VIEW COMPARISON:  None. FINDINGS: Normal heart size and pulmonary vascularity. Emphysematous changes in the lungs. Peribronchial thickening with central interstitial changes likely representing chronic bronchitis. No focal airspace disease or consolidation. No blunting of costophrenic angles. No pneumothorax. Old right rib fractures. Degenerative changes in the spine. Calcification of the aorta. IMPRESSION: Emphysematous and chronic bronchitic changes in the lungs. No evidence of active pulmonary disease. Electronically Signed   By: Burman NievesWilliam  Stevens M.D.   On: 02/03/2016 02:26      Subjective: He is feeling ok, denies chest pain . Had mild SOB not new for him   Discharge Exam: Vitals:   02/04/16 0239 02/04/16 0830  BP: (!) 148/65 (!) 147/65  Pulse: (!) 59 70  Resp: 14 15  Temp: 98 F (36.7 C) 98 F (36.7 C)   Vitals:   02/03/16 2130 02/03/16 2200 02/04/16 0239 02/04/16 0830  BP: (!) 154/68 (!) 148/83 (!) 148/65 (!) 147/65  Pulse:   (!) 59 70  Resp: 20 14 14 15   Temp:   98 F (36.7 C) 98 F (36.7 C)  TempSrc:   Oral Oral  SpO2: 97% 97% 95% 95%  Weight:   89 kg (196 lb 3.4 oz)   Height:        General: Pt is alert, awake, not in acute distress Cardiovascular: RRR, S1/S2 +, no rubs, no gallops Respiratory: CTA bilaterally, no wheezing, no rhonchi Abdominal: Soft, NT, ND, bowel sounds + Extremities: no edema, no cyanosis    The results of significant diagnostics from this hospitalization  (including imaging, microbiology, ancillary and laboratory) are listed below for reference.     Microbiology: No results found for this or any previous visit (from the past 240 hour(s)).   Labs: BNP (last 3 results) No results for input(s): BNP in the last 8760 hours. Basic Metabolic Panel:  Recent Labs Lab 02/03/16 0152 02/04/16 0221  NA 140 137  K 4.2 3.6  CL 105 108  CO2 28 24  GLUCOSE 120* 98  BUN 12 9  CREATININE 1.07 0.89  CALCIUM 8.7* 8.3*   Liver Function Tests: No results for input(s): AST, ALT, ALKPHOS, BILITOT, PROT, ALBUMIN in the last 168 hours. No results for input(s): LIPASE, AMYLASE in the last 168 hours. No results for input(s): AMMONIA in the last 168 hours. CBC:  Recent Labs Lab 02/03/16 0152 02/04/16 0221  WBC 5.6 6.2  HGB 15.0 14.1  HCT 43.7 41.6  MCV 96.5 95.4  PLT 230 194   Cardiac Enzymes:  Recent Labs Lab 02/03/16 0528 02/03/16 1005 02/03/16 1845  TROPONINI 0.03* 0.06* 0.08*   BNP: Invalid input(s): POCBNP CBG: No results for input(s): GLUCAP in the last 168 hours. D-Dimer No results for input(s): DDIMER in the last 72 hours. Hgb A1c No results for input(s): HGBA1C in the last 72 hours. Lipid Profile  Recent Labs  02/03/16 1005  CHOL 195  HDL 41  LDLCALC 134*  TRIG 100  CHOLHDL 4.8   Thyroid function studies No results for input(s): TSH, T4TOTAL, T3FREE, THYROIDAB in the last 72 hours.  Invalid input(s): FREET3 Anemia work up No results for input(s): VITAMINB12, FOLATE, FERRITIN, TIBC, IRON, RETICCTPCT in the last 72 hours. Urinalysis No results found for: COLORURINE, APPEARANCEUR, LABSPEC, PHURINE, GLUCOSEU, HGBUR, BILIRUBINUR, KETONESUR, PROTEINUR, UROBILINOGEN, NITRITE, LEUKOCYTESUR Sepsis Labs Invalid input(s): PROCALCITONIN,  WBC,  LACTICIDVEN Microbiology No results found for this or any previous visit (from the past 240 hour(s)).   Time coordinating discharge: Over 30 minutes  SIGNED:   Alba Coryegalado,  Carrianne Hyun A, MD  [facility] 02/04/2016, 9:42 AM Pager   If 7PM-7AM, please contact night-coverage [email or web address] Password [email or web address]

## 2016-02-04 NOTE — Progress Notes (Signed)
Patient Name: Troy Jackson Date of Encounter: 02/04/2016  Primary Cardiologist: Zacharia Sowles [place] MD  Hospital Problem List     Principal Problem:   Chest pain Active Problems:   Hyperlipidemia   COPD (chronic obstructive pulmonary disease) (HCC)   Unstable angina (HCC)     Subjective   Feels OK today. Anxious about being in hospital. No chest pain. SOB some this am which is not unusual for him  Inpatient Medications    Scheduled Meds: . aspirin  81 mg Oral Daily  . clopidogrel  600 mg Oral Once  . mometasone-formoterol  2 puff Inhalation BID  . pantoprazole  40 mg Oral Daily  . sodium chloride flush  3 mL Intravenous Q12H   Continuous Infusions:  PRN Meds: sodium chloride, acetaminophen, albuterol, ALPRAZolam, nitroGLYCERIN, ondansetron (ZOFRAN) IV, sodium chloride flush   Vital Signs    Vitals:   02/03/16 2115 02/03/16 2130 02/03/16 2200 02/04/16 0239  BP: (!) 169/64 (!) 154/68 (!) 148/83 (!) 148/65  Pulse:    (!) 59  Resp: 20 20 14 14   Temp:    98 F (36.7 C)  TempSrc:    Oral  SpO2: 98% 97% 97% 95%  Weight:    196 lb 3.4 oz (89 kg)  Height:        Intake/Output Summary (Last 24 hours) at 02/04/16 0756 Last data filed at 02/04/16 0400  Gross per 24 hour  Intake              900 ml  Output                0 ml  Net              900 ml   Filed Weights   02/03/16 0155 02/03/16 0405 02/04/16 0239  Weight: 190 lb (86.2 kg) 194 lb 1.6 oz (88 kg) 196 lb 3.4 oz (89 kg)    Physical Exam    GEN: Well nourished, well developed, in no acute distress.  HEENT: Grossly normal.  Neck: Supple, no JVD, carotid bruits, or masses. Cardiac: RRR, no murmurs, rubs, or gallops. No clubbing, cyanosis, edema.  Radials/DP/PT 2+ and equal bilaterally.  Respiratory:  Respirations regular and unlabored, mild wheezing GI: Soft, nontender, nondistended, BS + x 4. MS: no deformity or atrophy. Skin: warm and dry, no rash. Neuro:  Strength and sensation are intact. Psych:  AAOx3.  Normal affect.  Labs    CBC  Recent Labs  02/03/16 0152 02/04/16 0221  WBC 5.6 6.2  HGB 15.0 14.1  HCT 43.7 41.6  MCV 96.5 95.4  PLT 230 194   Basic Metabolic Panel  Recent Labs  02/03/16 0152 02/04/16 0221  NA 140 137  K 4.2 3.6  CL 105 108  CO2 28 24  GLUCOSE 120* 98  BUN 12 9  CREATININE 1.07 0.89  CALCIUM 8.7* 8.3*   Liver Function Tests No results for input(s): AST, ALT, ALKPHOS, BILITOT, PROT, ALBUMIN in the last 72 hours. No results for input(s): LIPASE, AMYLASE in the last 72 hours. Cardiac Enzymes  Recent Labs  02/03/16 0528 02/03/16 1005 02/03/16 1845  TROPONINI 0.03* 0.06* 0.08*   BNP Invalid input(s): POCBNP D-Dimer No results for input(s): DDIMER in the last 72 hours. Hemoglobin A1C No results for input(s): HGBA1C in the last 72 hours. Fasting Lipid Panel  Recent Labs  02/03/16 1005  CHOL 195  HDL 41  LDLCALC 134*  TRIG 100  CHOLHDL 4.8   Thyroid Function  Tests No results for input(s): TSH, T4TOTAL, T3FREE, THYROIDAB in the last 72 hours.  Invalid input(s): FREET3  Telemetry    NSR, 5 beat run PACs - Personally Reviewed  ECG    NSR, normal. - Personally Reviewed  Radiology    Dg Chest Portable 1 View  Result Date: 02/03/2016 CLINICAL DATA:  Mid chest pain tonight EXAM: PORTABLE CHEST 1 VIEW COMPARISON:  None. FINDINGS: Normal heart size and pulmonary vascularity. Emphysematous changes in the lungs. Peribronchial thickening with central interstitial changes likely representing chronic bronchitis. No focal airspace disease or consolidation. No blunting of costophrenic angles. No pneumothorax. Old right rib fractures. Degenerative changes in the spine. Calcification of the aorta. IMPRESSION: Emphysematous and chronic bronchitic changes in the lungs. No evidence of active pulmonary disease. Electronically Signed   By: Burman Nieves M.D.   On: 02/03/2016 02:26    Cardiac Studies   Procedures   Coronary Stent  Intervention  Left Heart Cath and Coronary Angiography  Conclusion     The left ventricular systolic function is normal.  LV end diastolic pressure is mildly elevated.  The left ventricular ejection fraction is 55-65% by visual estimate.  There is no mitral valve regurgitation.  Ost Cx to Prox Cx lesion, 35 %stenosed.  Mid Cx lesion, 30 %stenosed.  Prox RCA to Mid RCA lesion, 30 %stenosed.  Prox Cx to Mid Cx lesion, 90 %stenosed.  Post intervention, there is a 0% residual stenosis.  A STENT SYNERGY DES 3X16 drug eluting stent was successfully placed.   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 Normal LV function 4. Mildly elevated LVEDP 5. Successful stenting of the mid LCx with DES  Plan: DAPT for one year. Smoking cessation. Risk factor modification. Anticipate DC in am.     Echo: Study Conclusions  - Left ventricle: The cavity size was normal. Wall thickness was   normal. Systolic function was normal. The estimated ejection   fraction was in the range of 60% to 65%. Wall motion was normal;   there were no regional wall motion abnormalities. Features are   consistent with a pseudonormal left ventricular filling pattern,   with concomitant abnormal relaxation and increased filling   pressure (grade 2 diastolic dysfunction). - Aortic valve: Mildly calcified annulus. - Left atrium: The atrium was mildly dilated. - Right atrium: The atrium was mildly dilated. - Pericardium, extracardiac: A trivial pericardial effusion was   identified.  Patient Profile     70 y.o. male with a history of  Hyperlipidemia (prescribed statin however not taking), COPD, GERD and going tobacco smoking who presented to the ER by EMS for evaluation of chest pain.   Assessment & Plan    1. NSTEMI with mild troponin leak to 0.08. Normal Ecg. Cardiac cath findings as noted above.  Anomalous take off of left main from [facility] cusp. Severe focal stenosis in mid LCx. S/p successful DES stenting of the LCx. On DAPT but experiencing some increased dyspnea on Brilinta. Will DC Brilinta and load with oral Plavix, then 75 mg daily. Continue ASA 81 mg daily. He is not a good candidate for beta blocker given resting brady and COPD 2. Tobacco abuse. Counseled on smoking cessation. Not motivated to quit. 3. Elevated BP. Not normally hypertensive. Thinks its related to anxiety. Recommend monitoring BP daily and recording readings. Bring to office on follow up. If still elevated will need  antihypertensive therapy with either ARB or amlodipine.  4. Hyperlipidemia. History of intolerance to statins. Has taken Vytorin in past. Would try Vytorin again at least for short term. May need to see if PCSK 9 inhibitor is an option as outpatient.   I have encouraged outpatient cardiac Rehab but he is not interested. We will arrange TOC follow up in our office.  Signed, Casee Knepp [place], MD  02/04/2016, 7:56 AM

## 2016-02-05 ENCOUNTER — Encounter (HOSPITAL_COMMUNITY): Payer: Self-pay | Admitting: Cardiology

## 2016-02-09 ENCOUNTER — Telehealth: Payer: Self-pay | Admitting: Cardiology

## 2016-02-09 NOTE — Telephone Encounter (Signed)
TOC phone call . Appt is on 02/11/16 at 9am w/ Rhonda Barrett. Thanks

## 2016-02-09 NOTE — Telephone Encounter (Signed)
[facility] on 02/03/2016 - 02/04/2016.  Patient understands to follow up with provider Theodore Demarkhonda Barrett, PA [facility] 02[facility]11-[facility]. Patient understands discharge instructions? Yes Patient understands medications and regiment? Yes Patient understands to bring all medications to this visit? Yes   Spoke with pt states that he is cutting down on the cigarettes from 2 packs and now he is down to 5 cigarettes Qd. Reminded pt that we would like him to stop, he states that is the plan. Pt states that he has increased his walking and would like to start doing yard work. I told him no, he said "oh no not by hand just on the riding lawn mower with the bag, doing the leaves". be careful and take frequent breaks and stay hydrated, not to get too tired. Pt states that he will.

## 2016-02-11 ENCOUNTER — Ambulatory Visit (INDEPENDENT_AMBULATORY_CARE_PROVIDER_SITE_OTHER): Payer: Medicare Other | Admitting: Physician Assistant

## 2016-02-11 ENCOUNTER — Encounter: Payer: Self-pay | Admitting: Physician Assistant

## 2016-02-11 VITALS — BP 110/64 | HR 82 | Ht 73.0 in | Wt 191.4 lb

## 2016-02-11 DIAGNOSIS — I214 Non-ST elevation (NSTEMI) myocardial infarction: Secondary | ICD-10-CM

## 2016-02-11 DIAGNOSIS — Z720 Tobacco use: Secondary | ICD-10-CM | POA: Diagnosis not present

## 2016-02-11 DIAGNOSIS — E785 Hyperlipidemia, unspecified: Secondary | ICD-10-CM | POA: Diagnosis not present

## 2016-02-11 HISTORY — DX: Non-ST elevation (NSTEMI) myocardial infarction: I21.4

## 2016-02-11 NOTE — Progress Notes (Signed)
Cardiology Office Note   Date:  02/11/2016   ID:  Troy Jackson, Troy Jackson 04-29-1945, MRN [id number]  PCP:  Ezequiel Kayser, MD  Cardiologist:  Dr Swaziland (in hospital 11/23)  Theodore Demark, [place]   Chief Complaint  Patient presents with  . Follow-up    TOC/had heart attack-patient has been scared    History of Present Illness: Troy Jackson is a 70 y.o. male with a history of HLD not on rx, COPD, GERD, tob use.  11/22 seen in hospital by Dr Antoine Poche and Dr Swaziland for NSTEMI, s/p cath w/ CFX DES. No BB 2nd resting bradycardia  Troy Jackson presents for post-hospital f/u.   Since d/c from the hospital, he has done well. He was walking a lot yesterday, did ok with it. His legs get weak but that improves with rest.    He was not seen by cardiac rehab. He is not interested in outpatient rehab.   He has not had chest pain. He has been compliant with his medications.   His wrist has healed well.   He has cut back on tobacco, is down to 4-5 cigarettes/day. He is breathing much better now. He has not had any wheezing since discharge.   He has cut back on ETOH, is down to 2 beers/day. He was drinking 3-4 beers/day.  He wants to be aggressive about preventing future heart attacks. He was active prior to the MI, wants to get back to that.   He does not have internet access.   He wants to know if he can lift > 5 lbs: yes He wants to know if he can drive: yes He wants to know if he can ride to the beach: yes He wants to know if he can use the riding mower to get up leaves: not yet, need to get fitness level up some since legs get weak with exertion.    Past Medical History:  Diagnosis Date  . Anxiety   . COPD (chronic obstructive pulmonary disease) (HCC)   . Coronary artery disease   . Diverticulitis   . GERD (gastroesophageal reflux disease)   . Hyperlipidemia   . Tobacco abuse     Past Surgical History:  Procedure Laterality Date  . CARDIAC CATHETERIZATION N/A  02/03/2016   Procedure: Left Heart Cath and Coronary Angiography;  Surgeon: Peter M Swaziland, MD;  Location: [facility] INVASIVE CV LAB;  Service: Cardiovascular;  Laterality: N/A;  . CARDIAC CATHETERIZATION N/A 02/03/2016   Procedure: Coronary Stent Intervention;  Surgeon: Peter M Swaziland, MD;  Location: [facility] INVASIVE CV LAB;  Service: Cardiovascular;  Laterality: N/A;  . CORONARY ANGIOPLASTY WITH STENT PLACEMENT  02/03/2016  . SHOULDER SURGERY Right    as child  . SHOULDER SURGERY Left    as child    Current Outpatient Prescriptions  Medication Sig Dispense Refill  . albuterol (PROVENTIL HFA;VENTOLIN HFA) 108 (90 Base) MCG/ACT inhaler Inhale 1-2 puffs into the lungs every 6 (six) hours as needed for wheezing or shortness of breath.    . ALPRAZolam (XANAX) 0.5 MG tablet Take 0.5 mg by mouth 3 (three) times daily as needed for anxiety. For anxiety    . aspirin 81 MG chewable tablet Chew 1 tablet (81 mg total) by mouth daily. 30 tablet 0  . budesonide-formoterol (SYMBICORT) 160-4.5 MCG/ACT inhaler Inhale 2 puffs into the lungs 2 (two) times daily.    . clopidogrel (PLAVIX) 75 MG tablet Take 1 tablet (75 mg total) by mouth daily. 30  tablet 3  . ezetimibe-simvastatin (VYTORIN) 10-20 MG tablet Take 1 tablet by mouth daily at 6 PM. 30 tablet 0  . Fluticasone-Salmeterol (ADVAIR) 250-50 MCG/DOSE AEPB Inhale 1 puff into the lungs 2 (two) times daily.    . nitroGLYCERIN (NITROSTAT) 0.4 MG SL tablet Place 1 tablet (0.4 mg total) under the tongue every 5 (five) minutes as needed for chest pain. 30 tablet 0  . pantoprazole (PROTONIX) 40 MG tablet Take 1 tablet (40 mg total) by mouth daily. 30 tablet 0   No current facility-administered medications for this visit.     Allergies:   Crestor [rosuvastatin calcium]    Social History:  The patient  reports that he has been smoking Cigarettes.  He has a 60.00 pack-year smoking history. He has never used smokeless tobacco. He reports that he drinks about 8.4 oz of  alcohol per week . He reports that he does not use drugs.   Family History:  The patient's family history includes Alcohol abuse in his brother, father, and paternal grandfather; Alzheimer's disease in his mother; Heart attack in his father and paternal grandfather; Heart disease in his father; Lung cancer in his father.    ROS:  Please see the history of present illness. All other systems are reviewed and negative.    PHYSICAL EXAM: VS:  BP 110/64 (BP Location: Left Arm, Patient Position: Sitting, Cuff Size: Normal)   Pulse 82   Ht 6\' 1"  (1.854 m)   Wt 191 lb 6 oz (86.8 kg)   SpO2 94%   BMI 25.25 kg/m  , BMI Body mass index is 25.25 kg/m. GEN: Well nourished, well developed, male in no acute distress  HEENT: normal for age  Neck: no JVD, no carotid bruit, no masses Cardiac: RRR; no murmur, no rubs, or gallops Respiratory: few scattered rales w/ slight wheeze bilaterally, normal work of breathing GI: soft, nontender, nondistended, + BS MS: no deformity or atrophy; no edema; distal pulses are 2+ in all 4 extremities; R radial cath site has healed in well, minor resolving ecchymosis.   Skin: warm and dry, no rash Neuro:  Strength and sensation are intact Psych: euthymic mood, full affect   EKG:  EKG is not ordered today.  CATH: 11/22  The left ventricular systolic function is normal.  LV end diastolic pressure is mildly elevated.  The left ventricular ejection fraction is 55-65% by visual estimate.  There is no mitral valve regurgitation.  Ost Cx to Prox Cx lesion, 35 %stenosed.  Mid Cx lesion, 30 %stenosed.  Prox RCA to Mid RCA lesion, 30 %stenosed.  Prox Cx to Mid Cx lesion, 90 %stenosed.  Post intervention, there is a 0% residual stenosis.  A STENT SYNERGY DES 3X16 drug eluting stent was successfully placed. 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 Normal  LV function 4. Mildly elevated LVEDP 5. Successful stenting of the mid LCx with DES Plan: DAPT for one year. Smoking cessation. Risk factor modification. Anticipate DC in am.   ECHO: 11/23 - Left ventricle: The cavity size was normal. Wall thickness was normal. Systolic function was normal. The estimated ejection fraction was in the range of 60% to 65%. Wall motion was normal; there were no regional wall motion abnormalities. Features are consistent with a pseudonormal left ventricular filling pattern, with concomitant abnormal relaxation and increased filling pressure (grade 2 diastolic dysfunction). - Aortic valve: Mildly calcified  annulus. - Left atrium: The atrium was mildly dilated. - Right atrium: The atrium was mildly dilated. - Pericardium, extracardiac: A trivial pericardial effusion was identified.   Recent Labs: 02/04/2016: BUN 9; Creatinine, Ser 0.89; Hemoglobin 14.1; Platelets 194; Potassium 3.6; Sodium 137  Troponin I  Date Value Ref Range Status  02/03/2016 0.08 (HH) <0.03 ng/mL Final  02/03/2016 0.06 (HH) <0.03 ng/mL Final  02/03/2016 0.03 (HH) <0.03 ng/mL Final   Lipid Panel    Component Value Date/Time   CHOL 195 02/03/2016 1005   TRIG 100 02/03/2016 1005   HDL 41 02/03/2016 1005   CHOLHDL 4.8 02/03/2016 1005   VLDL 20 02/03/2016 1005   LDLCALC 134 (H) 02/03/2016 1005     Wt Readings from Last 3 Encounters:  02/11/16 191 lb 6 oz (86.8 kg)  02/04/16 196 lb 3.4 oz (89 kg)  01/31/11 187 lb (84.8 kg)     Other studies Reviewed: Additional studies/ records that were reviewed today include: hospital records and testing.  ASSESSMENT AND PLAN:  1.  NSTEMI, Subsequent episode of care: He will be referred to cardiac rehab, but it is doubtful he will attend. He will be given instructions on how to increase his activity. He needs the education part of rehab so is encouraged to attend at least a few session. He had SOB w/ Brilinta>>Plavix and  tolerating the Plavx well. No BB 2nd resting bradycardia. He wanted to know what his CAD looked like, was given a copy of the cath diagram  2. HTN: Pt BP normal today, he states it runs ok at home, follow. He will ck at home and let us know if it is elevated.  3. Resting bradycardia: they have seen this on the home BP machine. Do not worry unless he is symptomatic or his HR is <35. No BB.  4. Hyperlipidemia: pt has been started on a statin, encouraged to stick to a low cholesterol diet and be complaint with medication.  5. Tobacco use: pt encouraged to continue to taper use and eventually quit.  6. ETOH use: pt encouraged to cut back to 1 beer daily. Pt reluctant to do this, is content with the decrease he has made.   Current medicines are reviewed at length with the patient today.  The patient does not have concerns regarding medicines.  The following changes have been made:  no change  Labs/ tests ordered today include:   Orders Placed This Encounter  Procedures  . AMB referral to cardiac rehabilitation     Disposition:   FU with Dr SwazilandJordan  Signed, Theodore DemarkBarrett, Amiir Heckard, PA-C  02/11/2016 1:15 PM    [facility] Phone: [phone]; Fax: [phone]  This note was written with the assistance of speech recognition software. Please excuse any transcriptional errors.

## 2016-02-11 NOTE — Patient Instructions (Addendum)
Exertional level 5-10   Your physician discussed the importance of regular exercise and recommended that you start or continue a regular exercise program for good health. Increase walking by 1 minuteover week after walking 5 min twice a day per day  Up to 15 min twice a day  , switch to 30 min daily. TARGET HEART RATE -115  Your physician encouraged you to lose weight for better health. With heart healthy diet     An ORDER HAS BEEN PLACED FOR CARDIAC REHAB.  Your physician discussed the hazards of tobacco use. Tobacco use cessation is recommended and techniques and options to help you quit were discussed.  COPY OF CARDIAC CATHETERIZATION DIAPHRAGM WAS GIVEN TO PATIENT.  Your physician recommends that you schedule a follow-up appointment in:3 MONTHS WITH DR Martin[place].  If you need a refill on your cardiac medications before your next appointment, please call your pharmacy.     Physical Activity With Heart Disease Exercising has many benefits, even when you have heart disease. It can help control cholesterol and high blood pressure and improve sleep. It can make your bones and heart muscle stronger. If you exercise about 20-30 minutes per day, 5 times per week, you will be able to do more and feel healthier. Before starting an exercise program, talk with your health care provider about exercising. Your health care provider may recommend a physical exam before starting an exercise program. Your health care provider can match your fitness level with the right exercise program. How can being active help improve my health? When exercise is done on a regular basis, there are many benefits. Exercise can:  Lower your blood pressure.  Lower your cholesterol.  Control your weight.  Improve your sleep.  Help control your blood sugars.  Improve your heart and lung function.  Strengthen your bones and reduce bone loss.  Reduce your risk of blood clots (thrombophlebitis).  Lower your risk  of certain cancers.  Improve your energy level.  Reduce stress.  Make you feel better about yourself. How do I begin a heart-healthy exercise program?  Talk with your health care provider about how often to exercise and what types of exercise would be good for you. Ask your health care provider if:  You should engage in moderate or vigorous cardiovascular exercise.  There are any exercises you should avoid.  Your medicines should be taken at a certain time.  You should check your pulse or take other precautions during exercise.  Get a calendar. Write down a schedule and plan for your exercise routine.  Consider joining a community exercise program, such as a biking group, yoga class, or swimming pool membership. You can also join a local gym. You can find free workout applications on some phones or devices, or purchase workout DVDs and exercise on your own. Find out what works for you.  After checking with your health care provider about approved activities, try to incorporate a variety of activities into your plan. For instance, you may do a yoga class Tuesdays and Thursdays, walk or climb stairs Mondays and Wednesdays, and then lift small weights while you are watching your favorite show on Sunday night.  If you have not been exercising, begin with sessions that last 10 to 15 minutes. Gradually work up to sessions that last 20 to 30 minutes. Try to exercise 5 times per week. Follow all of your health care provider's recommendations.  Be patient with yourself. It takes time to build up strength and lung  capacity. What are some types of exercises I could try? Cardiovascular exercise gets your heart and lungs working. Depending on your speed and intensity, a cardiovascular activity can be moderate or can become vigorous. Watch your pace and follow your health care provider's recommendations about the intensity of your workouts. Moderate cardiovascular exercise  includes:  Walking.  Slow bicycling.  Water aerobics.  Doubles tennis.  Ballroom dancing.  Light gardening or yard work. Vigorous cardiovascular exercise includes:  Jogging or running.  Jumping rope.  Singles tennis.  Stair climbing.  Swimming laps.  Cross-country skiing.  Hiking uphill.  Heavy gardening, such as digging trenches. Strengthening exercises tense your muscles to build strength. Some examples include:  Doing push-ups and abdominal work.  Lifting small weights.  Using resistance bands.  Doing exercises with a medicine ball.  Doing pull-ups on a pull-up bar. Flexibility exercises lengthen your muscles to keep them flexible and less tight and improve balance. Some examples include:  Stretching.  Yoga.  Tai chi.  [place] barre. What other things will help me be successful?  If you do not enjoy an activity, try a new one. Keep doing new things until you find exercises that you like.  Drink plenty of water before, during, and after exercise.  Eat meals about 2 hours before you exercise. If you need to snack right before, eat fruit such as a banana or apple.  Wear clothing that is comfortable, fits well, and is appropriate for the weather. Also be sure to wear supportive shoes.  Warm up or stretch for 5 minutes before exercise. Slowly decrease your activity or cool down for 5 minutes after exercise. This can help prevent injury. Ask your health care provider for more information.  Always exercise within your abilities and be aware of what your body is able to handle.  If you belong to a gym, ask the staff to help you learn how to use equipment and exercise machines. Ask a physical therapist or trainer about proper form and techniques to prevent injury. Are there any precautions I might need to take?  Depending on your condition, you may need to work closely with your health care provider or specialist to come up with an approved exercise  program. Follow your health care provider's instructions for recovery, cardiac rehabilitation, and a long-term exercise plan.  Because of your heart condition, you are more sensitive to weather and environmental changes and need to be cautious. If there are extreme outdoor conditions, such as heat, humidity, or cold, you may need to exercise indoors. If chemicals or other pollutants in the air reach an unsafe level and there is an air pollution advisory, you may need to exercise indoors. Your local news, board of health, or hospital can provide information on air quality. Exercise in an indoor, climate-controlled facility if these conditions exist.  Monitor your pulse if directed by your health care provider.  If you feel tired, or have any heart symptoms, such as shortness of breath, dizziness, irregular heartbeat, or chest pains, stop exercising and call your health care provider or 911.  If you have certain types of heart disease, you may need to take extra precautions. These may include:  Avoiding heavy lifting.  Avoiding strenuous activities.  Making sure you include a cool-down period to give your body time to adjust.  Understanding how your medicines can affect you during exercise. Certain medicines may cause heat intolerance and changes in blood sugar.  Knowing your regular symptoms. Slow down and rest  when you need to. Call your health care provider if they happen more, last longer, or feel stronger.  Keeping nitroglycerin spray and tabletswith you at all times if you have angina. Use them as directed to prevent and treat symptoms. When should I see my health care provider?  You have chest pain, shortness of breath, or feel very tired.  You have pain in the arm, shoulder, neck, or jaw.  You feel weak, dizzy, or light-headed.  You have an irregular heart rate or your heart rate is greater than 100 beats per minute (bpm) before exercise. This information is not intended to  replace advice given to you by your health care provider. Make sure you discuss any questions you have with your health care provider. Document Released: 09/25/2013 Document Revised: 08/03/2015 Document Reviewed: 07/02/2013 Elsevier Interactive Patient Education  2017 [place] and Cholesterol Restricted Diet Introduction Getting too much fat and cholesterol in your diet may cause health problems. Following this diet helps keep your fat and cholesterol at normal levels. This can keep you from getting sick. What types of fat should I choose?  Choose monosaturated and polyunsaturated fats. These are found in foods such as olive oil, canola oil, flaxseeds, walnuts, almonds, and seeds.  Eat more omega-3 fats. Good choices include salmon, mackerel, sardines, tuna, flaxseed oil, and ground flaxseeds.  Limit saturated fats. These are in animal products such as meats, butter, and cream. They can also be in plant products such as palm oil, palm kernel oil, and coconut oil.  Avoid foods with partially hydrogenated oils in them. These contain trans fats. Examples of foods that have trans fats are stick margarine, some tub margarines, cookies, crackers, and other baked goods. What general guidelines do I need to follow?  Check food labels. Look for the words "trans fat" and "saturated fat."  When preparing a meal:  Fill half of your plate with vegetables and green salads.  Fill one fourth of your plate with whole grains. Look for the word "whole" as the first word in the ingredient list.  Fill one fourth of your plate with lean protein foods.  Eat more foods that have fiber, like apples, carrots, beans, peas, and barley.  Eat more home-cooked foods. Eat less at restaurants and buffets.  Limit or avoid alcohol.  Limit foods high in starch and sugar.  Limit fried foods.  Cook foods without frying them. Baking, boiling, grilling, and broiling are all great  options.  Lose weight if you are overweight. Losing even a small amount of weight can help your overall health. It can also help prevent diseases such as diabetes and heart disease. What foods can I eat? Grains  Whole grains, such as whole wheat or whole grain breads, crackers, cereals, and pasta. Unsweetened oatmeal, bulgur, barley, quinoa, or brown rice. Corn or whole wheat flour tortillas. Vegetables  Fresh or frozen vegetables (raw, steamed, roasted, or grilled). Green salads. Fruits  All fresh, canned (in natural juice), or frozen fruits. Meat and Other Protein Products  Ground beef (85% or leaner), grass-fed beef, or beef trimmed of fat. Skinless chicken or [place]. Ground chicken or [place]. Pork trimmed of fat. All fish and seafood. Eggs. Dried beans, peas, or lentils. Unsalted nuts or seeds. Unsalted canned or dry beans. Dairy  Low-fat dairy products, such as skim or 1% milk, 2% or reduced-fat cheeses, low-fat ricotta or cottage cheese, or plain low-fat yogurt. Fats and Oils  Tub margarines  without trans fats. Light or reduced-fat mayonnaise and salad dressings. Avocado. Olive, canola, sesame, or safflower oils. Natural peanut or almond butter (choose ones without added sugar and oil). The items listed above may not be a complete list of recommended foods or beverages. Contact your dietitian for more options.  What foods are not recommended? Grains  White bread. White pasta. White rice. Cornbread. Bagels, pastries, and croissants. Crackers that contain trans fat. Vegetables  White potatoes. Corn. Creamed or fried vegetables. Vegetables in a cheese sauce. Fruits  Dried fruits. Canned fruit in light or heavy syrup. Fruit juice. Meat and Other Protein Products  Fatty cuts of meat. Ribs, chicken wings, bacon, sausage, bologna, salami, chitterlings, fatback, hot dogs, bratwurst, and packaged luncheon meats. Liver and organ meats. Dairy  Whole or 2% milk, cream, half-and-half, and cream  cheese. Whole milk cheeses. Whole-fat or sweetened yogurt. Full-fat cheeses. Nondairy creamers and whipped toppings. Processed cheese, cheese spreads, or cheese curds. Sweets and Desserts  Corn syrup, sugars, honey, and molasses. Candy. Jam and jelly. Syrup. Sweetened cereals. Cookies, pies, cakes, donuts, muffins, and ice cream. Fats and Oils  Butter, stick margarine, lard, shortening, ghee, or bacon fat. Coconut, palm kernel, or palm oils. Beverages  Alcohol. Sweetened drinks (such as sodas, lemonade, and fruit drinks or punches). The items listed above may not be a complete list of foods and beverages to avoid. Contact your dietitian for more information.  This information is not intended to replace advice given to you by your health care provider. Make sure you discuss any questions you have with your health care provider. Document Released: 08/30/2011 Document Revised: 11/05/2015 Document Reviewed: 05/30/2013  2017 Elsevier

## 2016-05-03 ENCOUNTER — Encounter (HOSPITAL_COMMUNITY): Payer: Self-pay | Admitting: Internal Medicine

## 2016-05-03 NOTE — Progress Notes (Signed)
Mailed patient letter with information about Cardiac Rehab program. MW °

## 2016-05-03 NOTE — Progress Notes (Signed)
Cardiology Office Note    Date:  05/04/2016   ID:  Troy, Jackson [birth year], MRN [id number]  PCP:  Jerlyn Ly, MD  Cardiologist:  Kavya Haag Martinique, MD    History of Present Illness:  Troy Jackson is a 71 y.o. male seen for follow up CAD. He has a history of HLD, tobacco abuse, COPD, and GERD. He had remote stress test in 2004 that was normal. He was admitted in November 2017 after presenting with severe chest pain. Troponin was mildly elevated at .06. Echo showed normal LV function. Cardiac cath showed 90% proximal to mid LCx stenosis treated successfully with DES. Discharged on ASA, Plavix, Vytorin. Beta blocker held due to bradycardia. He was intolerant of Brilinta due to dyspnea.   Since then he has quit smoking. He denies any recurrent chest pain. He does note some dyspnea on exertion. He had a bout of bronchitis and was treated with antibiotics and steroids. He is active playing golf, walking in neighborhood and doing yard work. BP at home ranges from 379-024 systolic. Overall feels well. He is scheduled for complete physical with Dr. Joylene Draft.  Past Medical History:  Diagnosis Date  . Anxiety   . COPD (chronic obstructive pulmonary disease) ([place])   . Coronary artery disease   . Diverticulitis   . GERD (gastroesophageal reflux disease)   . Hyperlipidemia   . Tobacco abuse     Past Surgical History:  Procedure Laterality Date  . CARDIAC CATHETERIZATION N/A 02/03/2016   Procedure: Left Heart Cath and Coronary Angiography;  Surgeon: Julyana Woolverton M Martinique, MD;  Location: [facility];  Service: Cardiovascular;  Laterality: N/A;  . CARDIAC CATHETERIZATION N/A 02/03/2016   Procedure: Coronary Stent Intervention;  Surgeon: Bladyn Tipps M Martinique, MD;  Location: [facility];  Service: Cardiovascular;  Laterality: N/A;  . CORONARY ANGIOPLASTY WITH STENT PLACEMENT  02/03/2016  . SHOULDER SURGERY Right    as child  . SHOULDER SURGERY Left    as child    Current  Medications: Outpatient Medications Prior to Visit  Medication Sig Dispense Refill  . albuterol (PROVENTIL HFA;VENTOLIN HFA) 108 (90 Base) MCG/ACT inhaler Inhale 1-2 puffs into the lungs every 6 (six) hours as needed for wheezing or shortness of breath.    . ALPRAZolam (XANAX) 0.5 MG tablet Take 0.5 mg by mouth 3 (three) times daily as needed for anxiety. For anxiety    . aspirin 81 MG chewable tablet Chew 1 tablet (81 mg total) by mouth daily. 30 tablet 0  . budesonide-formoterol (SYMBICORT) 160-4.5 MCG/ACT inhaler Inhale 2 puffs into the lungs 2 (two) times daily.    . clopidogrel (PLAVIX) 75 MG tablet Take 1 tablet (75 mg total) by mouth daily. 30 tablet 3  . ezetimibe-simvastatin (VYTORIN) 10-20 MG tablet Take 1 tablet by mouth daily at 6 PM. 30 tablet 0  . Fluticasone-Salmeterol (ADVAIR) 250-50 MCG/DOSE AEPB Inhale 1 puff into the lungs 2 (two) times daily.    . nitroGLYCERIN (NITROSTAT) 0.4 MG SL tablet Place 1 tablet (0.4 mg total) under the tongue every 5 (five) minutes as needed for chest pain. 30 tablet 0  . pantoprazole (PROTONIX) 40 MG tablet Take 1 tablet (40 mg total) by mouth daily. 30 tablet 0   No facility-administered medications prior to visit.      Allergies:   Crestor [rosuvastatin calcium]   Social History   Social History  . Marital status: Married    Spouse name: N/A  . Number of children:  2  . 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 Smoking status: Current Some Day Smoker    Packs/day: 1.00    Years: 60.00    Types: Cigarettes  . Smokeless tobacco: Never Used     Comment: down to 4 cigarettes a day   . Alcohol use 8.4 oz/week    14 Cans of beer per week     Comment: 02/03/2016 "couple beers/day"  . Drug use: No  . Sexual activity: Not Asked   Other Topics Concern  . None   Social History Narrative  . None     Family History:  The patient's family history includes  Alcohol abuse in his brother, father, and paternal grandfather; Alzheimer's disease in his mother; Heart attack in his father and paternal grandfather; Heart disease in his father; Lung cancer in his father.   ROS:   Please see the history of present illness.    ROS All other systems reviewed and are negative.   PHYSICAL EXAM:   VS:  BP (!) 156/62   Pulse 70   Ht _0  (1.854 m)   Wt 201 lb (91.2 kg)   BMI 26.52 kg/m    GEN: Well nourished, well developed, in no acute distress  HEENT: normal  Neck: no JVD, carotid bruits, or masses Cardiac: RRR; no murmurs, rubs, or gallops,no edema  Respiratory:  clear to auscultation bilaterally, normal work of breathing GI: soft, nontender, nondistended, + BS MS: no deformity or atrophy  Skin: warm and dry, no rash Neuro:  Alert and Oriented x 3, Strength and sensation are intact Psych: euthymic mood, full affect  Wt Readings from Last 3 Encounters:  05/04/16 201 lb (91.2 kg)  02/11/16 191 lb 6 oz (86.8 kg)  02/04/16 196 lb 3.4 oz (89 kg)      Studies/Labs Reviewed:   EKG:  EKG is not ordered today.  The ekg ordered today demonstrates *N/A  Recent Labs: 02/04/2016: BUN 9; Creatinine, Ser 0.89; Hemoglobin 14.1; Platelets 194; Potassium 3.6; Sodium 137   Lipid Panel    Component Value Date/Time   CHOL 195 02/03/2016 1005   TRIG 100 02/03/2016 1005   HDL 41 02/03/2016 1005   CHOLHDL 4.8 02/03/2016 1005   VLDL 20 02/03/2016 1005   LDLCALC 134 (H) 02/03/2016 1005    Additional studies/ records that were reviewed today include:  Echo: 02/03/16:Study Conclusions  - Left ventricle: The cavity size was normal. Wall thickness was   normal. Systolic function was normal. The estimated ejection   fraction was in the range of 60% to 65%. Wall motion was normal;   there were no regional wall motion abnormalities. Features are   consistent with a pseudonormal left ventricular filling pattern,   with concomitant abnormal relaxation and  increased filling   pressure (grade 2 diastolic dysfunction). - Aortic valve: Mildly calcified annulus. - Left atrium: The atrium was mildly dilated. - Right atrium: The atrium was mildly dilated. - Pericardium, extracardiac: A trivial pericardial effusion was   identified.  Cardiac cath: 02/03/16:Conclusion     The left ventricular systolic function is normal.  LV end diastolic pressure is mildly elevated.  The left ventricular ejection fraction is 55-65% by visual estimate.  There is no mitral valve regurgitation.  Ost Cx to Prox Cx lesion, 35 %stenosed.  Mid Cx lesion, 30 %stenosed.  Prox RCA to Mid RCA lesion, 30 %stenosed.  Prox Cx  to Mid Cx lesion, 90 %stenosed.  Post intervention, there is a 0% residual stenosis.  A STENT SYNERGY DES 3X16 drug eluting stent was successfully placed.   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 Normal LV function 4. Mildly elevated LVEDP 5. Successful stenting of the mid LCx with DES  Plan: DAPT for one year. Smoking cessation. Risk factor modification. Anticipate DC in am.    Indications   Unstable angina (HCC) [I20.0 (ICD-10-CM)]  Procedural Details/Technique   Technical Details Indication: 71 yo WM with history of HLD and tobacco abuse presents with unstable angina.  Procedural Details: The right wrist was prepped, draped, and anesthetized with 1% lidocaine. Using the modified Seldinger technique, a 6 French slender sheath was introduced into the right radial artery. 3 mg of verapamil was administered through the sheath, weight-based unfractionated heparin was administered intravenously. Standard Judkins catheters were used for selective coronary angiography and left ventriculography. PCI was performed as noted below. Catheter exchanges were performed over an exchange length guidewire. There were no immediate procedural  complications. A TR band was used for radial hemostasis at the completion of the procedure. The patient was transferred to the post catheterization recovery area for further monitoring. Contrast: 215 cc   Estimated blood loss <50 mL.  During this procedure the patient was administered the following to achieve and maintain moderate conscious sedation: Versed 3 mg, Fentanyl 125 mcg, while the patient's heart rate, blood pressure, and oxygen saturation were continuously monitored. The period of conscious sedation was 73 minutes, of which I was present face-to-face 100% of this time.    Complications   Complications documented before study signed (02/03/2016 6:01 PM EST)    No complications were associated with this study.  Documented by Onesti Bonfiglio M Martinique, MD - 02/03/2016 5:53 PM EST    Coronary Findings   Dominance: Right  Left Main  The vessel originates from the right coronary cusp.  Left Circumflex  Ost Cx to Prox Cx lesion, 35% stenosed.  Prox Cx to Mid Cx lesion, 90% stenosed.  Angioplasty: Lesion crossed with guidewire using a WIRE ASAHI PROWATER 180CM. Pre-stent angioplasty was performed using a BALLOON EMERGE MR 2.5X12. Maximum pressure: 10 atm. A STENT SYNERGY DES 3X16 drug eluting stent was successfully placed. Stent strut is well apposed. Post-stent angioplasty was performed using a BALLOON [place] TREK RX 3.0X12. Maximum pressure: 16 atm. The pre-interventional distal flow is normal (TIMI 3). The post-interventional distal flow is normal (TIMI 3). The intervention was successful . No complications occurred at this lesion. The anomalous LCA was engaged with a HS guide. Due to tenuous guide support a Guideliner was advanced into the LCA over a balloon. The patient developed acute chest pain and spasm of the left main. The Guideliner was removed and the spasm resolved with IC Ntg. We were able to cross the lesion and perform PCI without Guideliner support.  There is no residual stenosis post  intervention.  Mid Cx lesion, 30% stenosed.  Right Coronary Artery  Prox RCA to Mid RCA lesion, 30% stenosed.  Wall Motion              Left Heart   Left Ventricle The left ventricular size is normal. The left ventricular systolic function is normal. LV end diastolic pressure is mildly elevated. The left ventricular ejection fraction is 55-65% by visual estimate. No regional wall motion abnormalities. There is  no evidence of mitral regurgitation.    Coronary Diagrams   Diagnostic Diagram     Post-Intervention Diagram        ASSESSMENT:    1. Tobacco use   2. Hyperlipidemia LDL goal <70   3. Coronary artery disease involving native coronary artery of native heart with other form of angina pectoris ()      PLAN:  In order of problems listed above:  1.  NSTEMI, Subsequent episode of care: CAD.  He had SOB w/ Brilinta>>Plavix and tolerating the Plavx well. No BB 2nd resting bradycardia. Clinically doing well without angina.  2. HTN: Pt BP elevated today, he states it runs ok at home, follow. He will ck at home and let us know if it is elevated.  3. Resting bradycardia  4. Hyperlipidemia: pt is on Vytorin, follow up lab work with Dr. Joylene Draft. If still not at goal would increase statin.  5. Tobacco use: now quit. Congratulated.      Medication Adjustments/Labs and Tests Ordered: Current medicines are reviewed at length with the patient today.  Concerns regarding medicines are outlined above.  Medication changes, Labs and Tests ordered today are listed in the Patient Instructions below. Patient Instructions  Continue your current therapy  I will see you in 6 months.      Signed, Eldar Robitaille Martinique, MD  05/04/2016 10:57 AM    [facility] [address] [place], [place], [address] [phone]

## 2016-05-04 ENCOUNTER — Ambulatory Visit (INDEPENDENT_AMBULATORY_CARE_PROVIDER_SITE_OTHER): Payer: Medicare Other | Admitting: Cardiology

## 2016-05-04 ENCOUNTER — Encounter: Payer: Self-pay | Admitting: Cardiology

## 2016-05-04 VITALS — BP 156/62 | HR 70 | Ht 73.0 in | Wt 201.0 lb

## 2016-05-04 DIAGNOSIS — Z720 Tobacco use: Secondary | ICD-10-CM

## 2016-05-04 DIAGNOSIS — E785 Hyperlipidemia, unspecified: Secondary | ICD-10-CM

## 2016-05-04 DIAGNOSIS — I25118 Atherosclerotic heart disease of native coronary artery with other forms of angina pectoris: Secondary | ICD-10-CM | POA: Diagnosis not present

## 2016-05-04 NOTE — Patient Instructions (Signed)
Continue your current therapy  I will see you in 6 months.   

## 2016-05-16 ENCOUNTER — Ambulatory Visit: Payer: Medicare Other | Admitting: Cardiology

## 2016-12-31 NOTE — Progress Notes (Signed)
Cardiology Office Note    Date:  01/04/2017   ID:  Troy Jackson, Troy Jackson Troy Jackson, MRN [id number]  PCP:  Troy Ran, MD  Cardiologist:  Troy Swaziland, MD    History of Present Illness:  Troy Jackson is a 71 y.o. male seen for follow up CAD. He has a history of HLD, tobacco abuse, COPD, and GERD. He had remote stress test in 2004 that was normal. He was admitted in November 2017 after presenting with severe chest pain. Troponin was mildly elevated at .06. Echo showed normal LV function. Cardiac cath showed 90% proximal to mid LCx stenosis treated successfully with DES. Discharged on ASA, Plavix, Vytorin. Beta blocker held due to bradycardia. He was intolerant of Brilinta due to dyspnea.   On follow up today he is doing very well.  He denies any chest pain or dyspnea.  He recently  had a bout of bronchitis and was treated with antibiotics and steroids. He is active playing golf,  doing yard work, and working for the [employer]. BP at home ranges from 115-135 systolic. He has not resumed smoking and has been smoke free for 11 months.  Past Medical History:  Diagnosis Date  . Anxiety   . COPD (chronic obstructive pulmonary disease) ([facility])   . Coronary artery disease   . Diverticulitis   . GERD (gastroesophageal reflux disease)   . Hyperlipidemia   . Tobacco abuse     Past Surgical History:  Procedure Laterality Date  . CARDIAC CATHETERIZATION N/A 02/03/2016   Procedure: Left Heart Cath and Coronary Angiography;  Surgeon: Troy M Swaziland, MD;  Location: [facility];  Service: Cardiovascular;  Laterality: N/A;  . CARDIAC CATHETERIZATION N/A 02/03/2016   Procedure: Coronary Stent Intervention;  Surgeon: Troy M Swaziland, MD;  Location: [facility];  Service: Cardiovascular;  Laterality: N/A;  . CORONARY ANGIOPLASTY WITH STENT PLACEMENT  02/03/2016  . SHOULDER SURGERY Right    as child  . SHOULDER SURGERY Left    as child    Current Medications: Outpatient  Medications Prior to Visit  Medication Sig Dispense Refill  . albuterol (PROVENTIL HFA;VENTOLIN HFA) 108 (90 Base) MCG/ACT inhaler Inhale 1-2 puffs into the lungs every 6 (six) hours as needed for wheezing or shortness of breath.    . ALPRAZolam (XANAX) 0.5 MG tablet Take 0.5 mg by mouth 3 (three) times daily as needed for anxiety. For anxiety    . aspirin 81 MG chewable tablet Chew 1 tablet (81 mg total) by mouth daily. 30 tablet 0  . budesonide-formoterol (SYMBICORT) 160-4.5 MCG/ACT inhaler Inhale 2 puffs into the lungs 2 (two) times daily.    . clopidogrel (PLAVIX) 75 MG tablet Take 1 tablet (75 mg total) by mouth daily. 30 tablet 3  . ezetimibe-simvastatin (VYTORIN) 10-20 MG tablet Take 1 tablet by mouth daily at 6 PM. 30 tablet 0  . Fluticasone-Salmeterol (ADVAIR) 250-50 MCG/DOSE AEPB Inhale 1 puff into the lungs 2 (two) times daily.    . pantoprazole (PROTONIX) 40 MG tablet Take 1 tablet (40 mg total) by mouth daily. 30 tablet 0  . nitroGLYCERIN (NITROSTAT) 0.4 MG SL tablet Place 1 tablet (0.4 mg total) under the tongue every 5 (five) minutes as needed for chest pain. 30 tablet 0   No facility-administered medications prior to visit.      Allergies:   Crestor [rosuvastatin calcium]   Social History   Social History  . Marital status: Married    Spouse name: N/A  .  Number of children: 2  . 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 Smoking status: Current Some Day Smoker    Packs/day: 1.00    Years: 60.00    Types: Cigarettes  . Smokeless tobacco: Never Used     Comment: down to 4 cigarettes a day   . Alcohol use 8.4 oz/week    14 Cans of beer per week     Comment: 02/03/2016 "couple beers/day"  . Drug use: No  . Sexual activity: Not Asked   Other Topics Concern  . None   Social History Narrative  . None     Family History:  The patient's family history includes Alcohol abuse in his  brother, father, and paternal grandfather; Alzheimer's disease in his mother; Heart attack in his father and paternal grandfather; Heart disease in his father; Lung cancer in his father.   ROS:   Please see the history of present illness.    ROS All other systems reviewed and are negative.   PHYSICAL EXAM:   VS:  BP (!) 154/76   Pulse 71   Ht 6\' 1"  (1.854 m)   Wt 197 lb (89.4 kg)   BMI 25.99 kg/m    GENERAL:  Well appearing WM in NAD HEENT:  PERRL, EOMI, sclera are clear. Oropharynx is clear. NECK:  No jugular venous distention, carotid upstroke brisk and symmetric, no bruits, no thyromegaly or adenopathy LUNGS:  Clear to auscultation bilaterally CHEST:  Unremarkable HEART:  RRR,  PMI not displaced or sustained,S1 and S2 within normal limits, no S3, no S4: no clicks, no rubs, no murmurs ABD:  Soft, nontender. BS +, no masses or bruits. No hepatomegaly, no splenomegaly EXT:  2 + pulses throughout, no edema, no cyanosis no clubbing SKIN:  Warm and dry.  No rashes NEURO:  Alert and oriented x 3. Cranial nerves II through XII intact. PSYCH:  Cognitively intact    Wt Readings from Last 3 Encounters:  01/04/17 197 lb (89.4 kg)  05/04/16 201 lb (91.2 kg)  02/11/16 191 lb 6 oz (86.8 kg)      Studies/Labs Reviewed:   EKG:  EKG is ordered today.  The ekg ordered today demonstrates NSR with normal Ecg. I have personally reviewed and interpreted this study.   Recent Labs: 02/04/2016: BUN 9; Creatinine, Ser 0.89; Hemoglobin 14.1; Platelets 194; Potassium 3.6; Sodium 137   Lipid Panel    Component Value Date/Time   CHOL 195 02/03/2016 1005   TRIG 100 02/03/2016 1005   HDL 41 02/03/2016 1005   CHOLHDL 4.8 02/03/2016 1005   VLDL 20 02/03/2016 1005   LDLCALC 134 (H) 02/03/2016 1005       ASSESSMENT:    1. Coronary artery disease involving native coronary artery of native heart with other form of angina pectoris (HCC)   2. Hyperlipidemia LDL goal <70   3. Tobacco use       PLAN:  In order of problems listed above:  1. CAD with history of NSTEMI, s/p stenting of the LCx in November 2017 with DES.   No BB 2nd resting bradycardia. He is asymptomatic. Will discontinue Plavix in one month.  2. HTN: Pt BP elevated today, but he reports much better readings at home.   3. Hyperlipidemia: pt is on Vytorin, intolerant to Crestor. Follow up lab work with Dr. Waynard Edwards. If still not at goal would consider increase dose or PCSK 9 inhibitor.  5. Tobacco use: now quit. Congratulated.      Medication Adjustments/Labs and Tests Ordered: Current medicines are reviewed at length with the patient today.  Concerns regarding medicines are outlined above.  Medication changes, Labs and Tests ordered today are listed in the Patient Instructions below. Patient Instructions  Stop your Plavix in one month  Follow up with Dr. Waynard EdwardsPerini for your lab work      Signed, Troy SwazilandJordan, MD  01/04/2017 11:33 AM    [facility] [address], [place], [place], [id number] [phone]

## 2017-01-04 ENCOUNTER — Encounter: Payer: Self-pay | Admitting: Cardiology

## 2017-01-04 ENCOUNTER — Ambulatory Visit (INDEPENDENT_AMBULATORY_CARE_PROVIDER_SITE_OTHER): Payer: Medicare Other | Admitting: Cardiology

## 2017-01-04 VITALS — BP 154/76 | HR 71 | Ht 73.0 in | Wt 197.0 lb

## 2017-01-04 DIAGNOSIS — I25118 Atherosclerotic heart disease of native coronary artery with other forms of angina pectoris: Secondary | ICD-10-CM

## 2017-01-04 DIAGNOSIS — Z720 Tobacco use: Secondary | ICD-10-CM | POA: Diagnosis not present

## 2017-01-04 DIAGNOSIS — E785 Hyperlipidemia, unspecified: Secondary | ICD-10-CM

## 2017-01-04 MED ORDER — NITROGLYCERIN 0.4 MG SL SUBL: 0.4000 mg | SUBLINGUAL_TABLET | SUBLINGUAL | 2 refills | Status: DC | PRN

## 2017-01-04 NOTE — Patient Instructions (Signed)
Stop your Plavix in one month  Follow up with Dr. Waynard EdwardsPerini for your lab work

## 2017-07-24 ENCOUNTER — Encounter (HOSPITAL_COMMUNITY): Payer: Self-pay

## 2017-07-24 ENCOUNTER — Other Ambulatory Visit: Payer: Self-pay

## 2017-07-24 ENCOUNTER — Inpatient Hospital Stay (HOSPITAL_COMMUNITY)
Admission: EM | Admit: 2017-07-24 | Discharge: 2017-07-28 | DRG: 871 | Disposition: A | Discharge status: Home / Self Care | Payer: Medicare Other | Attending: Internal Medicine | Admitting: Internal Medicine

## 2017-07-24 ENCOUNTER — Emergency Department (HOSPITAL_COMMUNITY): Payer: Medicare Other

## 2017-07-24 DIAGNOSIS — J181 Lobar pneumonia, unspecified organism: Secondary | ICD-10-CM | POA: Diagnosis present

## 2017-07-24 DIAGNOSIS — F419 Anxiety disorder, unspecified: Secondary | ICD-10-CM | POA: Diagnosis present

## 2017-07-24 DIAGNOSIS — J9601 Acute respiratory failure with hypoxia: Secondary | ICD-10-CM | POA: Diagnosis present

## 2017-07-24 DIAGNOSIS — J440 Chronic obstructive pulmonary disease with acute lower respiratory infection: Secondary | ICD-10-CM | POA: Diagnosis present

## 2017-07-24 DIAGNOSIS — F17210 Nicotine dependence, cigarettes, uncomplicated: Secondary | ICD-10-CM | POA: Diagnosis present

## 2017-07-24 DIAGNOSIS — Z955 Presence of coronary angioplasty implant and graft: Secondary | ICD-10-CM | POA: Diagnosis not present

## 2017-07-24 DIAGNOSIS — J189 Pneumonia, unspecified organism: Secondary | ICD-10-CM

## 2017-07-24 DIAGNOSIS — E872 Acidosis: Secondary | ICD-10-CM | POA: Diagnosis present

## 2017-07-24 DIAGNOSIS — Z888 Allergy status to other drugs, medicaments and biological substances status: Secondary | ICD-10-CM

## 2017-07-24 DIAGNOSIS — I2510 Atherosclerotic heart disease of native coronary artery without angina pectoris: Secondary | ICD-10-CM | POA: Diagnosis present

## 2017-07-24 DIAGNOSIS — E785 Hyperlipidemia, unspecified: Secondary | ICD-10-CM | POA: Diagnosis present

## 2017-07-24 DIAGNOSIS — R0600 Dyspnea, unspecified: Secondary | ICD-10-CM | POA: Diagnosis not present

## 2017-07-24 DIAGNOSIS — I480 Paroxysmal atrial fibrillation: Secondary | ICD-10-CM | POA: Diagnosis present

## 2017-07-24 DIAGNOSIS — A419 Sepsis, unspecified organism: Principal | ICD-10-CM | POA: Diagnosis present

## 2017-07-24 DIAGNOSIS — Z7951 Long term (current) use of inhaled steroids: Secondary | ICD-10-CM | POA: Diagnosis not present

## 2017-07-24 DIAGNOSIS — R0902 Hypoxemia: Secondary | ICD-10-CM

## 2017-07-24 DIAGNOSIS — K219 Gastro-esophageal reflux disease without esophagitis: Secondary | ICD-10-CM | POA: Diagnosis present

## 2017-07-24 DIAGNOSIS — Z7982 Long term (current) use of aspirin: Secondary | ICD-10-CM | POA: Diagnosis not present

## 2017-07-24 DIAGNOSIS — N179 Acute kidney failure, unspecified: Secondary | ICD-10-CM | POA: Diagnosis present

## 2017-07-24 DIAGNOSIS — Z79899 Other long term (current) drug therapy: Secondary | ICD-10-CM | POA: Diagnosis not present

## 2017-07-24 DIAGNOSIS — I4891 Unspecified atrial fibrillation: Secondary | ICD-10-CM | POA: Diagnosis not present

## 2017-07-24 DIAGNOSIS — J441 Chronic obstructive pulmonary disease with (acute) exacerbation: Secondary | ICD-10-CM | POA: Diagnosis present

## 2017-07-24 DIAGNOSIS — J449 Chronic obstructive pulmonary disease, unspecified: Secondary | ICD-10-CM | POA: Diagnosis present

## 2017-07-24 DIAGNOSIS — J438 Other emphysema: Secondary | ICD-10-CM | POA: Diagnosis not present

## 2017-07-24 LAB — CBC WITH DIFFERENTIAL/PLATELET
BASOS ABS: 0 10*3/uL (ref 0.0–0.1)
BASOS PCT: 0 %
Eosinophils Absolute: 0 10*3/uL (ref 0.0–0.7)
Eosinophils Relative: 0 %
HEMATOCRIT: 42.4 % (ref 39.0–52.0)
Hemoglobin: 14.2 g/dL (ref 13.0–17.0)
Lymphocytes Relative: 6 %
Lymphs Abs: 1 10*3/uL (ref 0.7–4.0)
MCH: 31.8 pg (ref 26.0–34.0)
MCHC: 33.5 g/dL (ref 30.0–36.0)
MCV: 95.1 fL (ref 78.0–100.0)
MONO ABS: 1.8 10*3/uL — AB (ref 0.1–1.0)
Monocytes Relative: 10 %
NEUTROS ABS: 14.8 10*3/uL — AB (ref 1.7–7.7)
NEUTROS PCT: 84 %
Platelets: 320 10*3/uL (ref 150–400)
RBC: 4.46 MIL/uL (ref 4.22–5.81)
RDW: 12.6 % (ref 11.5–15.5)
WBC: 17.7 10*3/uL — ABNORMAL HIGH (ref 4.0–10.5)

## 2017-07-24 LAB — URINALYSIS, ROUTINE W REFLEX MICROSCOPIC
BACTERIA UA: NONE SEEN
BILIRUBIN URINE: NEGATIVE
GLUCOSE, UA: 50 mg/dL — AB
HGB URINE DIPSTICK: NEGATIVE
Ketones, ur: NEGATIVE mg/dL
LEUKOCYTES UA: NEGATIVE
NITRITE: NEGATIVE
PH: 6 (ref 5.0–8.0)
Protein, ur: 30 mg/dL — AB
Specific Gravity, Urine: 1.016 (ref 1.005–1.030)

## 2017-07-24 LAB — COMPREHENSIVE METABOLIC PANEL
ALT: 33 U/L (ref 17–63)
ANION GAP: 10 (ref 5–15)
AST: 20 U/L (ref 15–41)
Albumin: 3.1 g/dL — ABNORMAL LOW (ref 3.5–5.0)
Alkaline Phosphatase: 77 U/L (ref 38–126)
BILIRUBIN TOTAL: 1.5 mg/dL — AB (ref 0.3–1.2)
BUN: 10 mg/dL (ref 6–20)
CALCIUM: 9 mg/dL (ref 8.9–10.3)
CO2: 26 mmol/L (ref 22–32)
Chloride: 99 mmol/L — ABNORMAL LOW (ref 101–111)
Creatinine, Ser: 1.31 mg/dL — ABNORMAL HIGH (ref 0.61–1.24)
GFR calc Af Amer: >60 mL/min (ref >60)
GFR, EST NON AFRICAN AMERICAN: 53 mL/min — AB (ref >60)
Glucose, Bld: 143 mg/dL — ABNORMAL HIGH (ref 65–99)
POTASSIUM: 4.3 mmol/L (ref 3.5–5.1)
Sodium: 135 mmol/L (ref 135–145)
TOTAL PROTEIN: 7.2 g/dL (ref 6.5–8.1)

## 2017-07-24 LAB — LACTIC ACID, PLASMA
LACTIC ACID, VENOUS: 2.6 mmol/L — AB (ref 0.5–1.9)
LACTIC ACID, VENOUS: 1.1 mmol/L (ref 0.5–1.9)

## 2017-07-24 LAB — I-STAT TROPONIN, ED: TROPONIN I, POC: 0 ng/mL (ref 0.00–0.08)

## 2017-07-24 LAB — I-STAT CG4 LACTIC ACID, ED
LACTIC ACID, VENOUS: 3.43 mmol/L — AB (ref 0.5–1.9)
Lactic Acid, Venous: 1.88 mmol/L (ref 0.5–1.9)

## 2017-07-24 LAB — BRAIN NATRIURETIC PEPTIDE: B Natriuretic Peptide: 120.1 pg/mL — ABNORMAL HIGH (ref 0.0–100.0)

## 2017-07-24 LAB — PROCALCITONIN: PROCALCITONIN: 4.63 ng/mL

## 2017-07-24 MED ORDER — ALBUTEROL SULFATE (2.5 MG/3ML) 0.083% IN NEBU: 2.5000 mg | INHALATION_SOLUTION | RESPIRATORY_TRACT | Status: DC | PRN

## 2017-07-24 MED ORDER — SODIUM CHLORIDE 0.9 % IV BOLUS
1000.0000 mL | Freq: Once | INTRAVENOUS | Status: AC
  Administered 2017-07-24: 1000 mL via INTRAVENOUS

## 2017-07-24 MED ORDER — MOMETASONE FURO-FORMOTEROL FUM 200-5 MCG/ACT IN AERO
2.0000 | INHALATION_SPRAY | Freq: Two times a day (BID) | RESPIRATORY_TRACT | Status: DC
  Administered 2017-07-25 – 2017-07-28 (×7): 2 via RESPIRATORY_TRACT
  Filled 2017-07-24: qty 8.8

## 2017-07-24 MED ORDER — ALPRAZOLAM 0.5 MG PO TABS
0.5000 mg | ORAL_TABLET | Freq: Three times a day (TID) | ORAL | Status: DC | PRN
  Administered 2017-07-24 – 2017-07-25 (×2): 0.5 mg via ORAL
  Filled 2017-07-24 (×2): qty 1

## 2017-07-24 MED ORDER — DIPHENHYDRAMINE HCL 25 MG PO CAPS
25.0000 mg | ORAL_CAPSULE | Freq: Once | ORAL | Status: AC
  Administered 2017-07-24: 25 mg via ORAL
  Filled 2017-07-24: qty 1

## 2017-07-24 MED ORDER — GUAIFENESIN ER 600 MG PO TB12
1200.0000 mg | ORAL_TABLET | Freq: Two times a day (BID) | ORAL | Status: DC
  Administered 2017-07-24 – 2017-07-28 (×8): 1200 mg via ORAL
  Filled 2017-07-24 (×8): qty 2

## 2017-07-24 MED ORDER — SODIUM CHLORIDE 0.9 % IV SOLN
500.0000 mg | Freq: Once | INTRAVENOUS | Status: DC
  Filled 2017-07-24: qty 500

## 2017-07-24 MED ORDER — ENOXAPARIN SODIUM 40 MG/0.4ML ~~LOC~~ SOLN
40.0000 mg | SUBCUTANEOUS | Status: DC
  Administered 2017-07-24 – 2017-07-26 (×3): 40 mg via SUBCUTANEOUS
  Filled 2017-07-24 (×4): qty 0.4

## 2017-07-24 MED ORDER — SODIUM CHLORIDE 0.9 % IV SOLN
INTRAVENOUS | Status: DC
  Administered 2017-07-24: 21:00:00 via INTRAVENOUS
  Administered 2017-07-25: 1000 mL via INTRAVENOUS
  Administered 2017-07-25 – 2017-07-26 (×2): via INTRAVENOUS

## 2017-07-24 MED ORDER — SIMVASTATIN 20 MG PO TABS
20.0000 mg | ORAL_TABLET | Freq: Every day | ORAL | Status: DC
  Administered 2017-07-24 – 2017-07-26 (×3): 20 mg via ORAL
  Filled 2017-07-24 (×4): qty 1

## 2017-07-24 MED ORDER — IPRATROPIUM BROMIDE 0.02 % IN SOLN
0.5000 mg | Freq: Once | RESPIRATORY_TRACT | Status: AC
  Administered 2017-07-24: 0.5 mg via RESPIRATORY_TRACT
  Filled 2017-07-24: qty 2.5

## 2017-07-24 MED ORDER — IPRATROPIUM-ALBUTEROL 0.5-2.5 (3) MG/3ML IN SOLN
3.0000 mL | Freq: Four times a day (QID) | RESPIRATORY_TRACT | Status: DC
  Administered 2017-07-24: 3 mL via RESPIRATORY_TRACT
  Filled 2017-07-24 (×2): qty 3

## 2017-07-24 MED ORDER — ORAL CARE MOUTH RINSE
15.0000 mL | Freq: Two times a day (BID) | OROMUCOSAL | Status: DC
  Administered 2017-07-24 – 2017-07-28 (×7): 15 mL via OROMUCOSAL

## 2017-07-24 MED ORDER — ALBUTEROL SULFATE (2.5 MG/3ML) 0.083% IN NEBU
5.0000 mg | INHALATION_SOLUTION | Freq: Once | RESPIRATORY_TRACT | Status: AC
  Administered 2017-07-24: 5 mg via RESPIRATORY_TRACT
  Filled 2017-07-24: qty 6

## 2017-07-24 MED ORDER — SODIUM CHLORIDE 0.9 % IV SOLN
500.0000 mg | Freq: Once | INTRAVENOUS | Status: AC
  Administered 2017-07-24: 500 mg via INTRAVENOUS
  Filled 2017-07-24: qty 500

## 2017-07-24 MED ORDER — SODIUM CHLORIDE 0.9 % IV SOLN
1.0000 g | Freq: Once | INTRAVENOUS | Status: AC
  Administered 2017-07-24: 1 g via INTRAVENOUS
  Filled 2017-07-24: qty 10

## 2017-07-24 MED ORDER — PANTOPRAZOLE SODIUM 40 MG PO TBEC
40.0000 mg | DELAYED_RELEASE_TABLET | Freq: Every day | ORAL | Status: DC
  Administered 2017-07-25 – 2017-07-28 (×4): 40 mg via ORAL
  Filled 2017-07-24 (×4): qty 1

## 2017-07-24 MED ORDER — PREDNISONE 20 MG PO TABS
40.0000 mg | ORAL_TABLET | Freq: Every day | ORAL | Status: DC
  Administered 2017-07-24 – 2017-07-25 (×2): 40 mg via ORAL
  Filled 2017-07-24 (×2): qty 2

## 2017-07-24 MED ORDER — SODIUM CHLORIDE 0.9 % IV SOLN
1.0000 g | Freq: Once | INTRAVENOUS | Status: DC
  Filled 2017-07-24: qty 10

## 2017-07-24 MED ORDER — ASPIRIN 81 MG PO CHEW
81.0000 mg | CHEWABLE_TABLET | Freq: Every day | ORAL | Status: DC
  Administered 2017-07-25 – 2017-07-27 (×3): 81 mg via ORAL
  Filled 2017-07-24 (×3): qty 1

## 2017-07-24 NOTE — H&P (Signed)
History and Physical    Troy Jackson:[id number] DOB: 01/15/1946 DOA: 07/24/2017  PCP: Rodrigo Ran, MD Consultants:  [place] - cardiology Patient coming from:  Home - lives with wife; NOK: wife, [phone]  Chief Complaint:  SOB  HPI: Troy Jackson is a 72 y.o. male with medical history significant of HLD; diverticulitis; CAD; and COPD with ongoing tobacco dependence presenting with  SOB.  SOB, fever, DOE x 4 days.  Pain in right upper lung fields.  SOB is all the time but worse with walking around.  Fever to 101.  Fever is intermittent.  Slight cough with a yellow plug this AM - but it hurts to cough so he is trying not to.  No sick contacts.    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  ED Course:   Hypoxia (80s), SOB, cough.  Dense R-sided PNA on CXR.  Elevated lactate, AKI.  Review of Systems: As per HPI; otherwise review of systems reviewed and negative.   Ambulatory Status:  Ambulates without assistance  Past Medical History:  Diagnosis Date  . Anxiety   . COPD (chronic obstructive pulmonary disease) (HCC)   . Coronary artery disease 2017   stent  . Diverticulitis   . GERD (gastroesophageal reflux disease)   . Hyperlipidemia   . Tobacco abuse 2017    Past Surgical History:  Procedure Laterality Date  . CARDIAC CATHETERIZATION N/A 02/03/2016   Procedure: Left Heart Cath and Coronary Angiography;  Surgeon: Peter M [place], MD;  Location: [facility] INVASIVE CV LAB;  Service: Cardiovascular;  Laterality: N/A;  . CARDIAC CATHETERIZATION N/A 02/03/2016   Procedure: Coronary Stent Intervention;  Surgeon: Peter M [place], MD;  Location: [facility] INVASIVE CV LAB;  Service: Cardiovascular;  Laterality: N/A;  . CORONARY ANGIOPLASTY WITH STENT PLACEMENT  02/03/2016  . SHOULDER SURGERY Right    as child  . SHOULDER SURGERY Left    as child    Social History   Socioeconomic History  . Marital status: Married    Spouse name: Not on  file  . Number of children: 2  . Years of education: Not on file  . Highest education level: Not on file  Occupational History  . Occupation: retired Sales promotion account executive; works as a Lobbyist  . Financial resource strain: Not on file  . Food insecurity:    Worry: Not on file    Inability: Not on file  . Transportation needs:    Medical: Not on file    Non-medical: Not on file  Tobacco Use  . Smoking status: Former Smoker    Packs/day: 1.00    Years: 60.00    Pack years: 60.00    Types: Cigarettes    Last attempt to quit: 2017    Years since quitting: 2.3  . Smokeless tobacco: Never Used  Substance and Sexual Activity  . Alcohol use: Yes    Alcohol/week: 12.0 oz    Types: 20 Cans of beer per week    Comment: cannot remember when his last day was without a drink  . Drug use: No  . Sexual activity: Not on file  Lifestyle  . Physical activity:    Days per week: Not on file    Minutes per session: Not on file  . Stress: Not on file  Relationships  . Social connections:    Talks on phone: Not on file    Gets together: Not on file  Attends religious service: Not on file    Active member of club or organization: Not on file    Attends meetings of clubs or organizations: Not on file    Relationship status: Not on file  . Intimate partner violence:    Fear of current or ex partner: Not on file    Emotionally abused: Not on file    Physically abused: Not on file    Forced sexual activity: Not on file  Other Topics Concern  . Not on file  Social History Narrative  . Not on file    Allergies  Allergen Reactions  . Crestor [Rosuvastatin Calcium] Other (See Comments)    All statins: Myalgias     Family History  Problem Relation Age of Onset  . Heart disease Father   . Lung cancer Father   . Heart attack Father   . Alcohol abuse Father   . Alzheimer's disease Mother   . Heart attack Paternal Grandfather   . Alcohol abuse Paternal Grandfather   . Alcohol  abuse Brother     Prior to Admission medications   Medication Sig Start Date End Date Taking? Authorizing Provider  acetaminophen (TYLENOL 8 HOUR) 650 MG CR tablet Take 1,300 mg by mouth every 8 (eight) hours as needed for pain or fever.   Yes [provider]  albuterol (PROVENTIL HFA;VENTOLIN HFA) 108 (90 Base) MCG/ACT inhaler Inhale 1-2 puffs into the lungs every 6 (six) hours as needed for wheezing or shortness of breath.   Yes [provider]  ALPRAZolam Prudy Feeler) 0.5 MG tablet Take 0.5 mg by mouth 3 (three) times daily as needed for anxiety. For anxiety 01/13/11  Yes [provider]  aspirin 81 MG chewable tablet Chew 1 tablet (81 mg total) by mouth daily. 02/04/16  Yes Regalado, Belkys A, MD  budesonide-formoterol (SYMBICORT) 160-4.5 MCG/ACT inhaler Inhale 2 puffs into the lungs 2 (two) times daily.   Yes [provider]  Liniments (BLUE-EMU SUPER STRENGTH) CREA Apply 1 application topically as needed (back pain).   Yes [provider]  nitroGLYCERIN (NITROSTAT) 0.4 MG SL tablet Place 1 tablet (0.4 mg total) under the tongue every 5 (five) minutes as needed for chest pain. 01/04/17  Yes [place], Peter M, MD  pantoprazole (PROTONIX) 40 MG tablet Take 1 tablet (40 mg total) by mouth daily. 02/05/16  Yes Regalado, Belkys A, MD  Phenyleph-Doxylamine-DM-APAP (NYQUIL SEVERE COLD/FLU) 5-6.25-10-325 MG/15ML LIQD Take 15 mLs by mouth at bedtime as needed (for sleep).   Yes [provider]  simvastatin (ZOCOR) 20 MG tablet Take 20 mg by mouth daily at 6 PM.  07/10/17  Yes [provider]  clopidogrel (PLAVIX) 75 MG tablet Take 1 tablet (75 mg total) by mouth daily. Patient not taking: Reported on 07/24/2017 02/04/16   Regalado, Jon Billings A, MD  ezetimibe-simvastatin (VYTORIN) 10-20 MG tablet Take 1 tablet by mouth daily at 6 PM. Patient not taking: Reported on 07/24/2017 02/04/16   Alba Cory, MD    Physical Exam: Vitals:   07/24/17 1300  07/24/17 1345 07/24/17 1445 07/24/17 1515  BP: 125/64 118/75 (!) 119/57 125/65  Pulse: (!) 118 (!) 114 (!) 106 (!) 101  Resp: (!) 32 (!) 28 (!) 27 18  Temp:      TempSrc:      SpO2: 91% 96% 99% 97%     General:  Appears calm and comfortable and is NAD on [place] O2 Eyes:  PERRL, EOMI, normal lids, iris ENT:  grossly normal  hearing, lips & tongue, mmm Neck:  no LAD, masses or thyromegaly; no carotid bruits Cardiovascular:  RRR, no m/r/g. No LE edema.  Respiratory:  RUL rhonchi with diffuse expiratory rhonchi and wheezing.  Mildly increased respiratory effort with tachypnea and use of accessory muscles. Abdomen:  soft, NT, ND, NABS Back:   normal alignment, no CVAT Skin:  no rash or induration seen on limited exam Musculoskeletal:  grossly normal tone BUE/BLE, good ROM, no bony abnormality Lower extremity:  No LE edema.  Limited foot exam with no ulcerations.  2+ distal pulses. Psychiatric:  grossly normal mood and affect although mildly anxious, speech fluent and appropriate, AOx3 Neurologic:  CN 2-12 grossly intact, moves all extremities in coordinated fashion, sensation intact    Radiological Exams on Admission: Dg Chest Port 1 View  Result Date: 07/24/2017 CLINICAL DATA:  Shortness of breath, cough, fever EXAM: PORTABLE CHEST 1 VIEW COMPARISON:  02/03/2016 FINDINGS: There is right upper lobe airspace disease most consistent with pneumonia. There is no pleural effusion or pneumothorax. The heart and mediastinal contours are unremarkable. The osseous structures are unremarkable. IMPRESSION: Right upper lobe pneumonia. Followup PA and lateral chest X-ray is recommended in 3-4 weeks following trial of antibiotic therapy to ensure resolution and exclude underlying malignancy. Electronically Signed   By: Elige Ko   On: 07/24/2017 12:37    EKG: Independently reviewed.  Sinus tachycardia with rate 121; nonspecific ST changes with no evidence of acute ischemia   Labs on Admission: I have  personally reviewed the available labs and imaging studies at the time of the admission.  Pertinent labs:   Lactate 3.43 Troponin 0.00 Blood cultures x 2 pending BNP 120.1 Glucose 143 Creatinine 1.31; GFR 53 Albumin 3.1 Bili 1.5 WBC 17.7 UA: 50 glucose, 30 protein  Assessment/Plan Principal Problem:   Sepsis due to pneumonia (HCC) Active Problems:   Hyperlipidemia   COPD (chronic obstructive pulmonary disease) (HCC)   CAD (coronary artery disease)   Sepsis -SIRS criteria in this patient includes: Leukocytosis, tachycardia, tachypnea  -Patient has evidence of acute organ failure with elevated lactate and AKI -While awaiting blood cultures, this appears to be a preseptic condition. -Sepsis protocol initiated -Given productive cough, mildly decreased oxygen saturation, and infiltrate in right upper lobe on chest x-ray, most likely community-acquired pneumonia.  -CURB-65 score is 2 - will admit the patient to telemetry overnight. -Pneumonia Severity Index (PSI) is Class 4, 9% mortality. -Corticosteroids have been to shown to low overall mortality rate; risk of ARDS; and need for mechanical ventilation.  This is particularly true in severe PNA (class 3+ PSI).  Will add 40 mg prednisone daily for 5 days. -Will start Azithromycin 500 mg daily AND Rocephin due to no risk factors for MDR cause. -NS @ 75cc/hr -Fever control -Repeat CBC in am -Sputum cultures -Blood cultures -Strep pneumo testing -Will order lower respiratory tract procalcitonin level.  Antibiotics would not be indicated for PCT <0.1 and probably should not be used for < 0.25.  >0.5 indicates infection and >>0.5 indicates more serious disease.  As the procalcitonin level normalizes, it will be reasonable to consider de-escalation of antibiotic coverage. -albuterol PRN -Standing Duonebs -mucinex -Will trend lactate to ensure improvement  COPD -Patient with underlying COPD, not on home O2 -Continue Symbicort -Add  standing Duonebs and prn Albuterol -Steroids as above  HLD -Continue Zocor 20 mg daily  CAD -s/p stent -No current chest pain -Negative troponin -EKG reassuring -Heart healthy diet (family requested not to order this  since "he doesn't eat that at home", but he SHOULD be eating this at home and in the hospital).   DVT prophylaxis: Lovenox Code Status:  Full - confirmed with patient/family Family Communication: Wife and daughter present throughout the evaluation  Disposition Plan:  Home once clinically improved Consults called: None  Admission status: Admit - It is my clinical opinion that admission to INPATIENT is reasonable and necessary because of the expectation that this patient will require hospital care that crosses at least 2 midnights to treat this condition based on the medical complexity of the problems presented.  Given the aforementioned information, the predictability of an adverse outcome is felt to be significant.    Jonah Blue MD [facility]  If note is complete, please contact covering daytime or nighttime physician. [email or web address] Password [email or web address]  07/24/2017, 4:20 PM

## 2017-07-24 NOTE — Progress Notes (Signed)
CRITICAL VALUE ALERT  Critical Value:  Lactic acid 2.6  Date & Time Notified:  07/24/17 2016  Provider Notified:  Clance Boll  Orders Received/Actions taken:  1L bolus normal saline @ 512mL/hr

## 2017-07-24 NOTE — ED Provider Notes (Signed)
[facility] EMERGENCY DEPARTMENT Provider Note   CSN: [id number] Arrival date & time: 07/24/17  1150     History   Chief Complaint Chief Complaint  Patient presents with  . Shortness of Breath     HPI Patient is a 72 year old male with history of COPD, CAD status post stent placement, ongoing tobacco use who presents with shortness of breath and fever.  Patient's wife reports that patient has had worsening shortness of breath over the past 4 days, especially with exertion.  He has had fevers and chills.  He denies any productive cough.  His wife is a former respiratory therapist, took his oxygen saturation at home which was decreased to 89%.  He does not use oxygen at home.  No improvement with albuterol.  He has also had some intermittent right-sided chest pain associated with cough or deep inspiration.  Past Medical History:  Diagnosis Date  . Anxiety   . COPD (chronic obstructive pulmonary disease) (HCC)   . Coronary artery disease 2017   stent  . Diverticulitis   . GERD (gastroesophageal reflux disease)   . Hyperlipidemia   . Tobacco abuse 2017    Patient Active Problem List   Diagnosis Date Noted  . Sepsis due to pneumonia (HCC) 07/24/2017  . CAD (coronary artery disease) 07/24/2017  . NSTEMI (non-ST elevated myocardial infarction) (HCC) 02/11/2016  . Chest pain 02/03/2016  . COPD (chronic obstructive pulmonary disease) (HCC) 02/03/2016  . Unstable angina (HCC)   . Hyperlipidemia 01/31/2011  . Tobacco abuse 01/31/2011    Past Surgical History:  Procedure Laterality Date  . CARDIAC CATHETERIZATION N/A 02/03/2016   Procedure: Left Heart Cath and Coronary Angiography;  Surgeon: Peter M [place], MD;  Location: [facility] INVASIVE CV LAB;  Service: Cardiovascular;  Laterality: N/A;  . CARDIAC CATHETERIZATION N/A 02/03/2016   Procedure: Coronary Stent Intervention;  Surgeon: Peter M [place], MD;  Location: [facility] INVASIVE CV LAB;  Service: Cardiovascular;   Laterality: N/A;  . CORONARY ANGIOPLASTY WITH STENT PLACEMENT  02/03/2016  . SHOULDER SURGERY Right    as child  . SHOULDER SURGERY Left    as child        Home Medications    Prior to Admission medications   Medication Sig Start Date End Date Taking? Authorizing Provider  acetaminophen (TYLENOL 8 HOUR) 650 MG CR tablet Take 1,300 mg by mouth every 8 (eight) hours as needed for pain or fever.   Yes [provider]  albuterol (PROVENTIL HFA;VENTOLIN HFA) 108 (90 Base) MCG/ACT inhaler Inhale 1-2 puffs into the lungs every 6 (six) hours as needed for wheezing or shortness of breath.   Yes [provider]  ALPRAZolam Prudy Feeler) 0.5 MG tablet Take 0.5 mg by mouth 3 (three) times daily as needed for anxiety. For anxiety 01/13/11  Yes [provider]  aspirin 81 MG chewable tablet Chew 1 tablet (81 mg total) by mouth daily. 02/04/16  Yes Regalado, Belkys A, MD  budesonide-formoterol (SYMBICORT) 160-4.5 MCG/ACT inhaler Inhale 2 puffs into the lungs 2 (two) times daily.   Yes [provider]  Liniments (BLUE-EMU SUPER STRENGTH) CREA Apply 1 application topically as needed (back pain).   Yes [provider]  nitroGLYCERIN (NITROSTAT) 0.4 MG SL tablet Place 1 tablet (0.4 mg total) under the tongue every 5 (five) minutes as needed for chest pain. 01/04/17  Yes [place], Peter M, MD  pantoprazole (PROTONIX) 40 MG tablet Take 1 tablet (40 mg total) by mouth daily. 02/05/16  Yes Regalado,  Belkys A, MD  Phenyleph-Doxylamine-DM-APAP (NYQUIL SEVERE COLD/FLU) 5-6.25-10-325 MG/15ML LIQD Take 15 mLs by mouth at bedtime as needed (for sleep).   Yes [provider]  simvastatin (ZOCOR) 20 MG tablet Take 20 mg by mouth daily at 6 PM.  07/10/17  Yes [provider]    Family History Family History  Problem Relation Age of Onset  . Heart disease Father   . Lung cancer Father   . Heart attack Father   . Alcohol abuse Father   . Alzheimer's disease Mother    . Heart attack Paternal Grandfather   . Alcohol abuse Paternal Grandfather   . Alcohol abuse Brother     Social History Social History   Tobacco Use  . Smoking status: Former Smoker    Packs/day: 1.00    Years: 60.00    Pack years: 60.00    Types: Cigarettes    Last attempt to quit: 2017    Years since quitting: 2.3  . Smokeless tobacco: Never Used  Substance Use Topics  . Alcohol use: Yes    Alcohol/week: 12.0 oz    Types: 20 Cans of beer per week    Comment: cannot remember when his last day was without a drink  . Drug use: No     Allergies   Crestor [rosuvastatin calcium]   Review of Systems Review of Systems  Constitutional: Positive for chills and fever.  HENT: Negative for ear pain and sore throat.   Eyes: Negative for pain and visual disturbance.  Respiratory: Positive for cough and shortness of breath.   Cardiovascular: Positive for chest pain. Negative for palpitations.  Gastrointestinal: Negative for abdominal pain and vomiting.  Genitourinary: Negative for dysuria and hematuria.  Musculoskeletal: Negative for arthralgias and back pain.  Skin: Negative for color change and rash.  Neurological: Negative for seizures and syncope.  All other systems reviewed and are negative.    Physical Exam Updated Vital Signs BP 125/65   Pulse (!) 101   Temp 98.6 F (37 C) (Oral)   Resp 18   SpO2 97%   Physical Exam  Constitutional: He appears well-developed and well-nourished.  HENT:  Head: Normocephalic and atraumatic.  Eyes: Conjunctivae are normal.  Neck: Neck supple.  Cardiovascular: Normal rate and regular rhythm.  No murmur heard. Pulmonary/Chest: Accessory muscle usage present. Tachypnea noted. He is in respiratory distress. He has wheezes. He has rhonchi.  Abdominal: Soft. There is no tenderness.  Musculoskeletal: He exhibits no edema.  Neurological: He is alert.  Skin: Skin is warm and dry.  Psychiatric: He has a normal mood and affect.    Nursing note and vitals reviewed.    ED Treatments / Results  Labs (all labs ordered are listed, but only abnormal results are displayed) Labs Reviewed  COMPREHENSIVE METABOLIC PANEL - Abnormal; Notable for the following components:      Result Value   Chloride 99 (*)    Glucose, Bld 143 (*)    Creatinine, Ser 1.31 (*)    Albumin 3.1 (*)    Total Bilirubin 1.5 (*)    GFR calc non Af Amer 53 (*)    All other components within normal limits  CBC WITH DIFFERENTIAL/PLATELET - Abnormal; Notable for the following components:   WBC 17.7 (*)    Neutro Abs 14.8 (*)    Monocytes Absolute 1.8 (*)    All other components within normal limits  URINALYSIS, ROUTINE W REFLEX MICROSCOPIC - Abnormal; Notable for the following components:   Color,  Urine AMBER (*)    Glucose, UA 50 (*)    Protein, ur 30 (*)    All other components within normal limits  BRAIN NATRIURETIC PEPTIDE - Abnormal; Notable for the following components:   B Natriuretic Peptide 120.1 (*)    All other components within normal limits  I-STAT CG4 LACTIC ACID, ED - Abnormal; Notable for the following components:   Lactic Acid, Venous 3.43 (*)    All other components within normal limits  CULTURE, BLOOD (ROUTINE X 2)  CULTURE, BLOOD (ROUTINE X 2)  I-STAT TROPONIN, ED  I-STAT CG4 LACTIC ACID, ED    EKG EKG Interpretation  Date/Time:  Monday Jul 24 2017 11:51:11 EDT Ventricular Rate:  121 PR Interval:  128 QRS Duration: 74 QT Interval:  320 QTC Calculation: 454 R Axis:   70 Text Interpretation:  Sinus tachycardia Otherwise normal ECG Confirmed by Cathren Laine ([id number]) on 07/24/2017 2:53:48 PM   Radiology Dg Chest Port 1 View  Result Date: 07/24/2017 CLINICAL DATA:  Shortness of breath, cough, fever EXAM: PORTABLE CHEST 1 VIEW COMPARISON:  02/03/2016 FINDINGS: There is right upper lobe airspace disease most consistent with pneumonia. There is no pleural effusion or pneumothorax. The heart and mediastinal contours  are unremarkable. The osseous structures are unremarkable. IMPRESSION: Right upper lobe pneumonia. Followup PA and lateral chest X-ray is recommended in 3-4 weeks following trial of antibiotic therapy to ensure resolution and exclude underlying malignancy. Electronically Signed   By: Elige Ko   On: 07/24/2017 12:37    Procedures Procedures (including critical care time)  Medications Ordered in ED Medications  predniSONE (DELTASONE) tablet 40 mg (has no administration in time range)  albuterol (PROVENTIL) (2.5 MG/3ML) 0.083% nebulizer solution 5 mg (5 mg Nebulization Given 07/24/17 1224)  ipratropium (ATROVENT) nebulizer solution 0.5 mg (0.5 mg Nebulization Given 07/24/17 1224)  cefTRIAXone (ROCEPHIN) 1 g in sodium chloride 0.9 % 100 mL IVPB (0 g Intravenous Stopped 07/24/17 1340)  azithromycin (ZITHROMAX) 500 mg in sodium chloride 0.9 % 250 mL IVPB (0 mg Intravenous Stopped 07/24/17 1501)  sodium chloride 0.9 % bolus 1,000 mL (1,000 mLs Intravenous New Bag/Given 07/24/17 1506)     Initial Impression / Assessment and Plan / ED Course  I have reviewed the triage vital signs and the nursing notes.  Pertinent labs & imaging results that were available during my care of the patient were reviewed by me and considered in my medical decision making (see chart for details).     Patient is a 72 year old male with history of COPD, CAD status post stent placement, ongoing tobacco use who presents with shortness of breath and fever.  Patient arrived tachypneic to the 30s, tachycardic, in moderate distress.  Exam as above, significant for diffuse wheezing and rhonchi.  Patient treated with albuterol treatment.  Labs and imaging obtained, significant for elevated lactate, leukocytosis, mild AKI and chest radiograph findings of pneumonia. EKG stable from previous, without ischemic changes. He was intermittently hypoxic to the 70s and 80s, placed on 2 L nasal cannula with improvement in oxygenation.  Patient  was treated with antibiotics for CAP.  Discussed with hospitalist for admission for pneumonia.  Patient and plan of care discussed with Attending physician, Dr. Denton Lank.    Final Clinical Impressions(s) / ED Diagnoses   Final diagnoses:  Community acquired pneumonia of right upper lobe of lung (HCC)  Dyspnea, unspecified type  Hypoxia    ED Discharge Orders    None       Marcoantonio Legault,  Samara Deist, MD 07/24/17 1648    Cathren Laine, MD 07/25/17 [id number]

## 2017-07-24 NOTE — ED Triage Notes (Signed)
Pt brought in by his wife for his shortness of breath, cough, fever, headache. Pt alert and oriented but clearly short of breath. Unable to speak in complete sentences. Skin warm to touch.

## 2017-07-25 DIAGNOSIS — J441 Chronic obstructive pulmonary disease with (acute) exacerbation: Secondary | ICD-10-CM

## 2017-07-25 DIAGNOSIS — R0902 Hypoxemia: Secondary | ICD-10-CM

## 2017-07-25 DIAGNOSIS — R0600 Dyspnea, unspecified: Secondary | ICD-10-CM

## 2017-07-25 LAB — CBC WITH DIFFERENTIAL/PLATELET
Basophils Absolute: 0 10*3/uL (ref 0.0–0.1)
Basophils Relative: 0 %
EOS PCT: 0 %
Eosinophils Absolute: 0 10*3/uL (ref 0.0–0.7)
HCT: 35 % — ABNORMAL LOW (ref 39.0–52.0)
Hemoglobin: 11.4 g/dL — ABNORMAL LOW (ref 13.0–17.0)
LYMPHS ABS: 0.6 10*3/uL — AB (ref 0.7–4.0)
LYMPHS PCT: 5 %
MCH: 31.4 pg (ref 26.0–34.0)
MCHC: 32.6 g/dL (ref 30.0–36.0)
MCV: 96.4 fL (ref 78.0–100.0)
MONO ABS: 1 10*3/uL (ref 0.1–1.0)
MONOS PCT: 9 %
Neutro Abs: 9.8 10*3/uL — ABNORMAL HIGH (ref 1.7–7.7)
Neutrophils Relative %: 86 %
PLATELETS: 257 10*3/uL (ref 150–400)
RBC: 3.63 MIL/uL — ABNORMAL LOW (ref 4.22–5.81)
RDW: 13.1 % (ref 11.5–15.5)
WBC: 11.4 10*3/uL — ABNORMAL HIGH (ref 4.0–10.5)

## 2017-07-25 LAB — STREP PNEUMONIAE URINARY ANTIGEN: STREP PNEUMO URINARY ANTIGEN: NEGATIVE

## 2017-07-25 LAB — BASIC METABOLIC PANEL
ANION GAP: 10 (ref 5–15)
BUN: 11 mg/dL (ref 6–20)
CHLORIDE: 103 mmol/L (ref 101–111)
CO2: 25 mmol/L (ref 22–32)
Calcium: 8.1 mg/dL — ABNORMAL LOW (ref 8.9–10.3)
Creatinine, Ser: 1.14 mg/dL (ref 0.61–1.24)
GFR calc Af Amer: >60 mL/min (ref >60)
GFR calc non Af Amer: >60 mL/min (ref >60)
GLUCOSE: 153 mg/dL — AB (ref 65–99)
POTASSIUM: 4.5 mmol/L (ref 3.5–5.1)
Sodium: 138 mmol/L (ref 135–145)

## 2017-07-25 MED ORDER — GUAIFENESIN-DM 100-10 MG/5ML PO SYRP: 5.0000 mL | ORAL_SOLUTION | ORAL | Status: DC | PRN

## 2017-07-25 MED ORDER — BENZONATATE 100 MG PO CAPS
100.0000 mg | ORAL_CAPSULE | Freq: Three times a day (TID) | ORAL | Status: DC
  Administered 2017-07-25 – 2017-07-28 (×10): 100 mg via ORAL
  Filled 2017-07-25 (×10): qty 1

## 2017-07-25 MED ORDER — SODIUM CHLORIDE 0.9 % IV SOLN
500.0000 mg | INTRAVENOUS | Status: DC
  Administered 2017-07-25 – 2017-07-27 (×3): 500 mg via INTRAVENOUS
  Filled 2017-07-25 (×4): qty 500

## 2017-07-25 MED ORDER — DIPHENHYDRAMINE HCL 25 MG PO CAPS
25.0000 mg | ORAL_CAPSULE | Freq: Once | ORAL | Status: AC
  Administered 2017-07-25: 25 mg via ORAL
  Filled 2017-07-25: qty 1

## 2017-07-25 MED ORDER — IPRATROPIUM-ALBUTEROL 0.5-2.5 (3) MG/3ML IN SOLN
3.0000 mL | Freq: Three times a day (TID) | RESPIRATORY_TRACT | Status: DC
  Administered 2017-07-25 – 2017-07-27 (×7): 3 mL via RESPIRATORY_TRACT
  Filled 2017-07-25 (×7): qty 3

## 2017-07-25 MED ORDER — SODIUM CHLORIDE 0.9 % IV SOLN
1.0000 g | INTRAVENOUS | Status: DC
  Administered 2017-07-25 – 2017-07-27 (×3): 1 g via INTRAVENOUS
  Filled 2017-07-25 (×4): qty 10

## 2017-07-25 NOTE — Progress Notes (Signed)
PT Cancellation Note  Patient Details Name: Troy Jackson MRN: [id number] DOB: February 21, 1946   Cancelled Treatment:    Reason Eval/Treat Not Completed: Other (comment).  Declined as he feels he is fine.  Did ask to come back by tomorrow to see if pt has changed his mind.  Follow up tomorrow.   Ivar Drape 07/25/2017, 2:43 PM   Samul Dada, PT MS Acute Rehab Dept. Number: [facility] [id number] and [facility] [phone]

## 2017-07-25 NOTE — Progress Notes (Addendum)
[facility]                                                                              Patient Demographics  Troy Jackson, is a 72 y.o. male, DOB - [birth year], ATF:[id number]  Admit date - 07/24/2017   Admitting Physician Karmen Bongo, MD  Outpatient Primary MD for the patient is Crist Infante, MD  Outpatient specialists:   LOS - 1  days   Medical records reviewed and are as summarized below:    Chief Complaint  Patient presents with  . Shortness of Breath       Brief summary   Troy Jackson is a 72 y.o. male with medical history significant of HLD; diverticulitis; CAD; and COPD with ongoing tobacco dependence presented with shortness of breath.  She reported dyspnea on exertion with fever for last 4 days prior to admission, pleuritic chest pain. SOB is all the time but worse with walking around, with coughing, productive with yellowish phlegm.  Patient had a temp of 101. degrees Fahrenheit.  In ED patient was hypoxic with O2 sat in 80s, chest x-ray showed right-sided pneumonia.   Assessment & Plan    Principal Problem: Acute hypoxic respiratory failure, sepsis due to pneumonia ([place]) secondary to right lung pneumonia -Met sepsis criteria in ED with hypoxia, lactic acidosis, leukocytosis, chest x-ray showing right upper lobe pneumonia.  -Wheezing noted on lung exam today, continue scheduled nebs, Dulera, flutter valve, O2 as tolerated -Placed on IV Zithromax and IV Rocephin daily, follow urine strep antigen, urine Legionella antigen, blood cultures. -Generalized weakness, will continue IV fluids and PT  Active Problems:   Hyperlipidemia -Continue simvastatin    COPD (chronic obstructive pulmonary disease) (HCC), exacerbation with mild wheezing -Continue scheduled nebs, O2, Dulera, flutter valve, prednisone.    CAD (coronary artery disease) -Negative troponins, currently stable, continue aspirin, statin  Anxiety -Continue Ativan as needed    Code Status: Full CODE STATUS DVT Prophylaxis:  Lovenox Family Communication: Discussed in detail with the patient, all imaging results, lab results explained to the patient    Disposition Plan: *Hopefully next 24 to 48 hours if improving  Time Spent in minutes   35 minutes  Procedures:  None  Consultants:   None  Antimicrobials:   IV Zithromax 5/14  IV Rocephin 5/14   Medications  Scheduled Meds: . aspirin  81 mg Oral Daily  . enoxaparin (LOVENOX) injection  40 mg Subcutaneous Q24H  . guaiFENesin  1,200 mg Oral BID  . ipratropium-albuterol  3 mL Nebulization TID  . mouth rinse  15 mL Mouth Rinse BID  . mometasone-formoterol  2 puff Inhalation BID  . pantoprazole  40 mg Oral Daily  . predniSONE  40 mg Oral Q breakfast  . simvastatin  20 mg Oral q1800   Continuous Infusions: . sodium chloride 1,000 mL (07/25/17 0734)  . azithromycin    . cefTRIAXone (ROCEPHIN)  IV     PRN Meds:.albuterol, ALPRAZolam   Antibiotics   Anti-infectives (From admission, onward)   Start     Dose/Rate Route Frequency Ordered Stop   07/25/17 1200  azithromycin (ZITHROMAX) 500 mg in sodium  chloride 0.9 % 250 mL IVPB     500 mg 250 mL/hr over 60 Minutes Intravenous  Once 07/24/17 1722     07/25/17 1200  cefTRIAXone (ROCEPHIN) 1 g in sodium chloride 0.9 % 100 mL IVPB     1 g 200 mL/hr over 30 Minutes Intravenous  Once 07/24/17 1722     07/24/17 1300  cefTRIAXone (ROCEPHIN) 1 g in sodium chloride 0.9 % 100 mL IVPB     1 g 200 mL/hr over 30 Minutes Intravenous  Once 07/24/17 1251 07/24/17 1340   07/24/17 1300  azithromycin (ZITHROMAX) 500 mg in sodium chloride 0.9 % 250 mL IVPB     500 mg 250 mL/hr over 60 Minutes Intravenous  Once 07/24/17 1251 07/24/17 1501        Subjective:   Troy Jackson was seen and examined today.  Feels still very weak, shortness of breath is improving, still coughing, bringing up phlegm.  Low-grade temp of 99.1 F this morning. Patient denies  dizziness, abdominal pain, N/V/D/C, new weakness, numbess, tingling. No acute events overnight.    Objective:   Vitals:   07/24/17 2013 07/24/17 2053 07/25/17 0444 07/25/17 0743  BP: 137/67  130/72   Pulse: (!) 102  75   Resp: (!) 28  18   Temp: 99.1 F (37.3 C)  97.6 F (36.4 C)   TempSrc: Oral  Oral   SpO2: 97% 97% 97% 96%  Weight: 96.5 kg (212 lb 11.9 oz)     Height: 6' 1" (1.854 m)       Intake/Output Summary (Last 24 hours) at 07/25/2017 1030 Last data filed at 07/25/2017 0939 Gross per 24 hour  Intake 1705 ml  Output 1800 ml  Net -95 ml     Wt Readings from Last 3 Encounters:  07/24/17 96.5 kg (212 lb 11.9 oz)  01/04/17 89.4 kg (197 lb)  05/04/16 91.2 kg (201 lb)     Exam  General: Alert and oriented x 3, NAD  Eyes:    HEENT:  Atraumatic, normocephalic  Cardiovascular: S1 S2 auscultated, no rubs, murmurs or gallops. Regular rate and rhythm.  Respiratory: Bilateral expiratory wheezing  Gastrointestinal: Soft, nontender, nondistended, + bowel sounds  Ext: no pedal edema bilaterally  Neuro: AAOx3, Cr N's II- XII. Strength 5/5 upper and lower extremities bilaterally  Musculoskeletal: No digital cyanosis, clubbing  Skin: No rashes  Psych: Normal affect and demeanor, alert and oriented x3    Data Reviewed:  I have personally reviewed following labs and imaging studies  Micro Results Recent Results (from the past 240 hour(s))  Blood culture (routine x 2)     Status: None (Preliminary result)   Collection Time: 07/24/17 12:55 PM  Result Value Ref Range Status   Specimen Description BLOOD LEFT ANTECUBITAL  Final   Special Requests   Final    BOTTLES DRAWN AEROBIC AND ANAEROBIC Blood Culture adequate volume   Culture   Final    NO GROWTH < 24 HOURS Performed at [facility], [address], [place], [place] [address]    Report Status PENDING  Incomplete  Blood culture (routine x 2)     Status: None (Preliminary result)   Collection Time:  07/24/17  1:07 PM  Result Value Ref Range Status   Specimen Description BLOOD RIGHT ANTECUBITAL  Final   Special Requests   Final    BOTTLES DRAWN AEROBIC AND ANAEROBIC Blood Culture results may not be optimal due to an excessive volume of blood received  in culture bottles   Culture   Final    NO GROWTH < 24 HOURS Performed at [facility], [place] [address], [place], [place] [address]    Report Status PENDING  Incomplete    Radiology Reports Dg Chest Port 1 View  Result Date: 07/24/2017 CLINICAL DATA:  Shortness of breath, cough, fever EXAM: PORTABLE CHEST 1 VIEW COMPARISON:  02/03/2016 FINDINGS: There is right upper lobe airspace disease most consistent with pneumonia. There is no pleural effusion or pneumothorax. The heart and mediastinal contours are unremarkable. The osseous structures are unremarkable. IMPRESSION: Right upper lobe pneumonia. Followup PA and lateral chest X-ray is recommended in 3-4 weeks following trial of antibiotic therapy to ensure resolution and exclude underlying malignancy. Electronically Signed   By: Kathreen Devoid   On: 07/24/2017 12:37    Lab Data:  CBC: Recent Labs  Lab 07/24/17 1228 07/25/17 0328  WBC 17.7* 11.4*  NEUTROABS 14.8* 9.8*  HGB 14.2 11.4*  HCT 42.4 35.0*  MCV 95.1 96.4  PLT 320 094   Basic Metabolic Panel: Recent Labs  Lab 07/24/17 1228 07/25/17 0328  NA 135 138  K 4.3 4.5  CL 99* 103  CO2 26 25  GLUCOSE 143* 153*  BUN 10 11  CREATININE 1.31* 1.14  CALCIUM 9.0 8.1*   GFR: Estimated Creatinine Clearance: 72.7 mL/min (by C-G formula based on SCr of 1.14 mg/dL). Liver Function Tests: Recent Labs  Lab 07/24/17 1228  AST 20  ALT 33  ALKPHOS 77  BILITOT 1.5*  PROT 7.2  ALBUMIN 3.1*   No results for input(s): LIPASE, AMYLASE in the last 168 hours. No results for input(s): AMMONIA in the last 168 hours. Coagulation Profile: No results for input(s): INR, PROTIME in the last 168 hours. Cardiac Enzymes: No results for  input(s): CKTOTAL, CKMB, CKMBINDEX, TROPONINI in the last 168 hours. BNP (last 3 results) No results for input(s): PROBNP in the last 8760 hours. HbA1C: No results for input(s): HGBA1C in the last 72 hours. CBG: No results for input(s): GLUCAP in the last 168 hours. Lipid Profile: No results for input(s): CHOL, HDL, LDLCALC, TRIG, CHOLHDL, LDLDIRECT in the last 72 hours. Thyroid Function Tests: No results for input(s): TSH, T4TOTAL, FREET4, T3FREE, THYROIDAB in the last 72 hours. Anemia Panel: No results for input(s): VITAMINB12, FOLATE, FERRITIN, TIBC, IRON, RETICCTPCT in the last 72 hours. Urine analysis:    Component Value Date/Time   COLORURINE AMBER (A) 07/24/2017 1228   APPEARANCEUR CLEAR 07/24/2017 1228   LABSPEC 1.016 07/24/2017 1228   PHURINE 6.0 07/24/2017 1228   GLUCOSEU 50 (A) 07/24/2017 1228   HGBUR NEGATIVE 07/24/2017 1228   BILIRUBINUR NEGATIVE 07/24/2017 [place] 07/24/2017 1228   PROTEINUR 30 (A) 07/24/2017 1228   NITRITE NEGATIVE 07/24/2017 1228   LEUKOCYTESUR NEGATIVE 07/24/2017 1228     Ripudeep Rai M.D. [facility] 07/25/2017, 10:30 AM  Pager: [phone] Between 7am to 7pm - call Pager - [phone]  After 7pm go to [email or web address] - password [email or web address]  Call night coverage person covering after 7pm

## 2017-07-25 NOTE — Progress Notes (Signed)
Patient wants benadryl with xanax- NP on call his been notified.

## 2017-07-25 NOTE — Care Management Note (Signed)
Case Management Note  Patient Details  Name: AEDON DEASON MRN: [id number] Date of Birth: 1945-04-29  Subjective/Objective:  Admitted for Sepsis due to Pneumonia.  PCP noted.             Action/Plan: Prior to admission patient lived at home with wife at: [address] [place], [place] [address].  At discharge patient plans to return to same living situation.  NCM will continue to follow for discharge transition needs.  Expected Discharge Date:   To be determined               Expected Discharge Plan:   To be determined  In-House Referral:   N/A Discharge planning Services   CM consult  Post Acute Care Choice:    Choice offered to:     DME Arranged:    DME Agency:     HH Arranged:    HH Agency:     Status of Service:   In progress, will continue to monitor  Yancey Flemings, RN 07/25/2017, 11:13 AM

## 2017-07-25 NOTE — Progress Notes (Signed)
Mr. Willetts was admitted to 5w07 from the ED via stretcher.  The patient is alert and oriented x4 and ambulatory.  Of note, the patient refused to allow this nurse and the charge nurse Alvino Chapel T. to complete a full skin assessment.  The patient states "If I had any problems with my skin I'd tell you."  Patient is oriented to the room.  Bed is in the lowest position, call bell and telephone are within reach.  Explained to patient how to use call bell and telephone, patient indicated understanding.  Will continue to monitor.

## 2017-07-26 DIAGNOSIS — J181 Lobar pneumonia, unspecified organism: Secondary | ICD-10-CM

## 2017-07-26 LAB — BASIC METABOLIC PANEL
ANION GAP: 10 (ref 5–15)
BUN: 20 mg/dL (ref 6–20)
CHLORIDE: 103 mmol/L (ref 101–111)
CO2: 27 mmol/L (ref 22–32)
Calcium: 8.4 mg/dL — ABNORMAL LOW (ref 8.9–10.3)
Creatinine, Ser: 1.1 mg/dL (ref 0.61–1.24)
GFR calc Af Amer: >60 mL/min (ref >60)
GFR calc non Af Amer: >60 mL/min (ref >60)
GLUCOSE: 111 mg/dL — AB (ref 65–99)
POTASSIUM: 3.4 mmol/L — AB (ref 3.5–5.1)
Sodium: 140 mmol/L (ref 135–145)

## 2017-07-26 LAB — CBC
HCT: 36.2 % — ABNORMAL LOW (ref 39.0–52.0)
HEMOGLOBIN: 11.6 g/dL — AB (ref 13.0–17.0)
MCH: 31.4 pg (ref 26.0–34.0)
MCHC: 32 g/dL (ref 30.0–36.0)
MCV: 97.8 fL (ref 78.0–100.0)
PLATELETS: 283 10*3/uL (ref 150–400)
RBC: 3.7 MIL/uL — AB (ref 4.22–5.81)
RDW: 12.3 % (ref 11.5–15.5)
WBC: 9.7 10*3/uL (ref 4.0–10.5)

## 2017-07-26 MED ORDER — DILTIAZEM HCL 25 MG/5ML IV SOLN
10.0000 mg | Freq: Once | INTRAVENOUS | Status: AC
  Administered 2017-07-27: 10 mg via INTRAVENOUS
  Filled 2017-07-26: qty 5

## 2017-07-26 MED ORDER — METOPROLOL TARTRATE 5 MG/5ML IV SOLN
5.0000 mg | Freq: Once | INTRAVENOUS | Status: AC
  Administered 2017-07-26: 5 mg via INTRAVENOUS
  Filled 2017-07-26: qty 5

## 2017-07-26 MED ORDER — ALPRAZOLAM 0.5 MG PO TABS
1.0000 mg | ORAL_TABLET | Freq: Every evening | ORAL | Status: DC | PRN
  Administered 2017-07-26 – 2017-07-27 (×2): 1 mg via ORAL
  Filled 2017-07-26 (×2): qty 2

## 2017-07-26 MED ORDER — METHYLPREDNISOLONE SODIUM SUCC 125 MG IJ SOLR
60.0000 mg | Freq: Four times a day (QID) | INTRAMUSCULAR | Status: DC
  Administered 2017-07-26 – 2017-07-27 (×4): 60 mg via INTRAVENOUS
  Filled 2017-07-26 (×4): qty 2

## 2017-07-26 NOTE — Progress Notes (Signed)
Patient was having labor breathing this morning on his way back from the RR. Once patient settled, his breathing improved. O2 was at 95% during this time. Patient educated to not walk around room at this time and to focus on breathing.

## 2017-07-26 NOTE — Progress Notes (Signed)
[facility]                                                                              Patient Demographics  Troy Jackson, is a 72 y.o. male, DOB - 1946-01-30, [id number]  Admit date - 07/24/2017   Admitting Physician Karmen Bongo, MD  Outpatient Primary MD for the patient is Crist Infante, MD  Outpatient specialists:   LOS - 2  days   Medical records reviewed and are as summarized below:    Chief Complaint  Patient presents with  . Shortness of Breath       Brief summary   Troy Jackson is a 72 y.o. male with medical history significant of HLD; diverticulitis; CAD; and COPD with ongoing tobacco dependence presented with shortness of breath.  She reported dyspnea on exertion with fever for last 4 days prior to admission, pleuritic chest pain. SOB is all the time but worse with walking around, with coughing, productive with yellowish phlegm.  Patient had a temp of 101. degrees Fahrenheit.  In ED patient was hypoxic with O2 sat in 80s, chest x-ray showed right-sided pneumonia.   Assessment & Plan    Principal Problem: Acute hypoxic respiratory failure, sepsis due to pneumonia and COPD exacerbation --Met sepsis criteria in ED  -continue Rocephin/azithromycin, scheduled nebs - Add IV Solu-Medrol due to profound wheezing - DC IV fluids - Wean O2, out of bed, ambulate    Hyperlipidemia -Continue simvastatin    COPD (chronic obstructive pulmonary disease) ([place]), exacerbation with wheezing -Continue scheduled nebs, O2, Dulera, flutter valve -stop prednisone and add IV solumedrol today    CAD (coronary artery disease) -Negative troponins, currently stable, continue aspirin, statin  Anxiety -Continue Ativan as needed   Code Status: Full CODE STATUS DVT Prophylaxis:  Lovenox Family Communication: Discussed in detail with the patient, all imaging results, lab results explained to the patient    Disposition Plan: home in 1-2 days once  respiratory status better  Time Spent in minutes   35 minutes  Procedures:  None  Consultants:   None  Antimicrobials:   IV Zithromax 5/14  IV Rocephin 5/14   Medications  Scheduled Meds: . aspirin  81 mg Oral Daily  . benzonatate  100 mg Oral TID  . enoxaparin (LOVENOX) injection  40 mg Subcutaneous Q24H  . guaiFENesin  1,200 mg Oral BID  . ipratropium-albuterol  3 mL Nebulization TID  . mouth rinse  15 mL Mouth Rinse BID  . methylPREDNISolone (SOLU-MEDROL) injection  60 mg Intravenous Q6H  . mometasone-formoterol  2 puff Inhalation BID  . pantoprazole  40 mg Oral Daily  . simvastatin  20 mg Oral q1800   Continuous Infusions: . azithromycin Stopped (07/25/17 1227)  . cefTRIAXone (ROCEPHIN)  IV 1 g (07/26/17 1201)   PRN Meds:.albuterol, ALPRAZolam, guaiFENesin-dextromethorphan   Antibiotics   Anti-infectives (From admission, onward)   Start     Dose/Rate Route Frequency Ordered Stop   07/25/17 1200  azithromycin (ZITHROMAX) 500 mg in sodium chloride 0.9 % 250 mL IVPB  Status:  Discontinued     500 mg 250 mL/hr over 60 Minutes Intravenous  Once  07/24/17 1722 07/25/17 1033   07/25/17 1200  cefTRIAXone (ROCEPHIN) 1 g in sodium chloride 0.9 % 100 mL IVPB  Status:  Discontinued     1 g 200 mL/hr over 30 Minutes Intravenous  Once 07/24/17 1722 07/25/17 1033   07/25/17 1130  azithromycin (ZITHROMAX) 500 mg in sodium chloride 0.9 % 250 mL IVPB     500 mg 250 mL/hr over 60 Minutes Intravenous Every 24 hours 07/25/17 1033 07/31/17 1129   07/25/17 1130  cefTRIAXone (ROCEPHIN) 1 g in sodium chloride 0.9 % 100 mL IVPB     1 g 200 mL/hr over 30 Minutes Intravenous Every 24 hours 07/25/17 1033 07/31/17 1129   07/24/17 1300  cefTRIAXone (ROCEPHIN) 1 g in sodium chloride 0.9 % 100 mL IVPB     1 g 200 mL/hr over 30 Minutes Intravenous  Once 07/24/17 1251 07/24/17 1340   07/24/17 1300  azithromycin (ZITHROMAX) 500 mg in sodium chloride 0.9 % 250 mL IVPB     500 mg 250 mL/hr  over 60 Minutes Intravenous  Once 07/24/17 1251 07/24/17 1501        Subjective:   Continues to feel short of breath, have chest tightness, and cough  Objective:   Vitals:   07/26/17 0540 07/26/17 0608 07/26/17 0610 07/26/17 0813  BP: (!) 166/84 (!) 161/76    Pulse: 100 98    Resp: 20     Temp: 98.8 F (37.1 C)     TempSrc:      SpO2: 95%  95% 93%  Weight:      Height:        Intake/Output Summary (Last 24 hours) at 07/26/2017 1245 Last data filed at 07/26/2017 1206 Gross per 24 hour  Intake 2332.5 ml  Output 1320 ml  Net 1012.5 ml     Wt Readings from Last 3 Encounters:  07/24/17 96.5 kg (212 lb 11.9 oz)  01/04/17 89.4 kg (197 lb)  05/04/16 91.2 kg (201 lb)     Exam Gen: Awake, Alert, Oriented X 3,  HEENT: PERRLA, Neck supple, no JVD Lungs: poor air movement, scattered expiratory wheezes CVS: S1-S2/regular rate rhythm, tachycardic Abd: soft, Non tender, non distended, BS present Extremities: No Cyanosis, Clubbing or edema Skin: no new rashes  Psych: Normal affect and demeanor, alert and oriented x3    Data Reviewed:  I have personally reviewed following labs and imaging studies  Micro Results Recent Results (from the past 240 hour(s))  Blood culture (routine x 2)     Status: None (Preliminary result)   Collection Time: 07/24/17 12:55 PM  Result Value Ref Range Status   Specimen Description BLOOD LEFT ANTECUBITAL  Final   Special Requests   Final    BOTTLES DRAWN AEROBIC AND ANAEROBIC Blood Culture adequate volume   Culture   Final    NO GROWTH < 24 HOURS Performed at [facility], [address], [place], [place] [address]    Report Status PENDING  Incomplete  Blood culture (routine x 2)     Status: None (Preliminary result)   Collection Time: 07/24/17  1:07 PM  Result Value Ref Range Status   Specimen Description BLOOD RIGHT ANTECUBITAL  Final   Special Requests   Final    BOTTLES DRAWN AEROBIC AND ANAEROBIC Blood Culture results may not  be optimal due to an excessive volume of blood received in culture bottles   Culture   Final    NO GROWTH < 24 HOURS Performed at [facility], [place] [address], [place], [place] [address]    Report Status PENDING  Incomplete    Radiology Reports Dg Chest Port 1 View  Result Date: 07/24/2017 CLINICAL DATA:  Shortness of breath, cough, fever EXAM: PORTABLE CHEST 1 VIEW COMPARISON:  02/03/2016 FINDINGS: There is right upper lobe airspace disease most consistent with pneumonia. There is no pleural effusion or pneumothorax. The heart and mediastinal contours are unremarkable. The osseous structures are unremarkable. IMPRESSION: Right upper lobe pneumonia. Followup PA and lateral chest X-ray is recommended in 3-4 weeks following trial of antibiotic therapy to ensure resolution and exclude underlying malignancy. Electronically Signed   By: Kathreen Devoid   On: 07/24/2017 12:37    Lab Data:  CBC: Recent Labs  Lab 07/24/17 1228 07/25/17 0328 07/26/17 0317  WBC 17.7* 11.4* 9.7  NEUTROABS 14.8* 9.8*  --   HGB 14.2 11.4* 11.6*  HCT 42.4 35.0* 36.2*  MCV 95.1 96.4 97.8  PLT 320 257 379   Basic Metabolic Panel: Recent Labs  Lab 07/24/17 1228 07/25/17 0328 07/26/17 0317  NA 135 138 140  K 4.3 4.5 3.4*  CL 99* 103 103  CO2 '26 25 27  '$ GLUCOSE 143* 153* 111*  BUN '10 11 20  '$ CREATININE 1.31* 1.14 1.10  CALCIUM 9.0 8.1* 8.4*   GFR: Estimated Creatinine Clearance: 75.4 mL/min (by C-G formula based on SCr of 1.1 mg/dL). Liver Function Tests: Recent Labs  Lab 07/24/17 1228  AST 20  ALT 33  ALKPHOS 77  BILITOT 1.5*  PROT 7.2  ALBUMIN 3.1*   No results for input(s): LIPASE, AMYLASE in the last 168 hours. No results for input(s): AMMONIA in the last 168 hours. Coagulation Profile: No results for input(s): INR, PROTIME in the last 168 hours. Cardiac Enzymes: No results for input(s): CKTOTAL, CKMB, CKMBINDEX, TROPONINI in the last 168 hours. BNP (last 3 results) No results for  input(s): PROBNP in the last 8760 hours. HbA1C: No results for input(s): HGBA1C in the last 72 hours. CBG: No results for input(s): GLUCAP in the last 168 hours. Lipid Profile: No results for input(s): CHOL, HDL, LDLCALC, TRIG, CHOLHDL, LDLDIRECT in the last 72 hours. Thyroid Function Tests: No results for input(s): TSH, T4TOTAL, FREET4, T3FREE, THYROIDAB in the last 72 hours. Anemia Panel: No results for input(s): VITAMINB12, FOLATE, FERRITIN, TIBC, IRON, RETICCTPCT in the last 72 hours. Urine analysis:    Component Value Date/Time   COLORURINE AMBER (A) 07/24/2017 1228   APPEARANCEUR CLEAR 07/24/2017 1228   LABSPEC 1.016 07/24/2017 1228   PHURINE 6.0 07/24/2017 1228   GLUCOSEU 50 (A) 07/24/2017 1228   HGBUR NEGATIVE 07/24/2017 1228   BILIRUBINUR NEGATIVE 07/24/2017 [place] 07/24/2017 1228   PROTEINUR 30 (A) 07/24/2017 1228   NITRITE NEGATIVE 07/24/2017 1228   LEUKOCYTESUR NEGATIVE 07/24/2017 1228     Domenic Polite M.D. [facility] 07/26/2017, 12:45 PM  Page via [email or web address] - password [email or web address]  Call night coverage person covering after 7pm

## 2017-07-26 NOTE — Progress Notes (Addendum)
PT Cancellation/Discharge Note  Patient Details Name: Troy Jackson MRN: [id number] DOB: February 04, 1946   Cancelled Treatment:    Reason Eval/Treat Not Completed: Patient declined, no reason specified; reports feels he does not need PT at this time.  Encouraged frequent ambulation and sit to stand for LE strengthening.  Wife and RN in room and agree.  Will sign off.   Elray Mcgregor 07/26/2017, 12:00 PM Sheran Lawless, [place] [phone] 07/26/2017

## 2017-07-27 ENCOUNTER — Encounter (HOSPITAL_COMMUNITY): Payer: Self-pay | Admitting: Cardiology

## 2017-07-27 ENCOUNTER — Inpatient Hospital Stay (HOSPITAL_COMMUNITY): Payer: Medicare Other

## 2017-07-27 DIAGNOSIS — J438 Other emphysema: Secondary | ICD-10-CM

## 2017-07-27 DIAGNOSIS — I2510 Atherosclerotic heart disease of native coronary artery without angina pectoris: Secondary | ICD-10-CM

## 2017-07-27 DIAGNOSIS — A419 Sepsis, unspecified organism: Principal | ICD-10-CM

## 2017-07-27 DIAGNOSIS — I4891 Unspecified atrial fibrillation: Secondary | ICD-10-CM | POA: Diagnosis not present

## 2017-07-27 DIAGNOSIS — J189 Pneumonia, unspecified organism: Secondary | ICD-10-CM

## 2017-07-27 LAB — BASIC METABOLIC PANEL
Anion gap: 9 (ref 5–15)
BUN: 17 mg/dL (ref 6–20)
CALCIUM: 8.5 mg/dL — AB (ref 8.9–10.3)
CHLORIDE: 108 mmol/L (ref 101–111)
CO2: 22 mmol/L (ref 22–32)
CREATININE: 0.86 mg/dL (ref 0.61–1.24)
Glucose, Bld: 203 mg/dL — ABNORMAL HIGH (ref 65–99)
Potassium: 4 mmol/L (ref 3.5–5.1)
SODIUM: 139 mmol/L (ref 135–145)

## 2017-07-27 LAB — TROPONIN I
Troponin I: <0.03 ng/mL (ref <0.03)
Troponin I: <0.03 ng/mL (ref <0.03)
Troponin I: <0.03 ng/mL (ref <0.03)

## 2017-07-27 LAB — ECHOCARDIOGRAM COMPLETE
Height: 73 in
Weight: 3403.9 oz

## 2017-07-27 LAB — TSH: TSH: 0.383 u[IU]/mL (ref 0.350–4.500)

## 2017-07-27 LAB — T4, FREE: FREE T4: 1.31 ng/dL (ref 0.82–1.77)

## 2017-07-27 LAB — MRSA PCR SCREENING: MRSA BY PCR: NEGATIVE

## 2017-07-27 MED ORDER — LEVALBUTEROL HCL 0.63 MG/3ML IN NEBU: 0.6300 mg | INHALATION_SOLUTION | Freq: Four times a day (QID) | RESPIRATORY_TRACT | Status: DC | PRN

## 2017-07-27 MED ORDER — METOPROLOL TARTRATE 5 MG/5ML IV SOLN
INTRAVENOUS | Status: AC
  Filled 2017-07-27: qty 10

## 2017-07-27 MED ORDER — FUROSEMIDE 10 MG/ML IJ SOLN
20.0000 mg | Freq: Once | INTRAMUSCULAR | Status: AC
  Administered 2017-07-27: 20 mg via INTRAVENOUS

## 2017-07-27 MED ORDER — DILTIAZEM HCL ER COATED BEADS 120 MG PO CP24
120.0000 mg | ORAL_CAPSULE | Freq: Every day | ORAL | Status: DC
  Administered 2017-07-27 – 2017-07-28 (×2): 120 mg via ORAL
  Filled 2017-07-27 (×2): qty 1

## 2017-07-27 MED ORDER — IPRATROPIUM BROMIDE 0.02 % IN SOLN
0.5000 mg | Freq: Four times a day (QID) | RESPIRATORY_TRACT | Status: DC
  Administered 2017-07-27 (×2): 0.5 mg via RESPIRATORY_TRACT
  Filled 2017-07-27 (×2): qty 2.5

## 2017-07-27 MED ORDER — METOPROLOL TARTRATE 5 MG/5ML IV SOLN
2.5000 mg | Freq: Once | INTRAVENOUS | Status: AC
  Administered 2017-07-27: 2.5 mg via INTRAVENOUS
  Filled 2017-07-27: qty 5

## 2017-07-27 MED ORDER — IPRATROPIUM BROMIDE 0.02 % IN SOLN: 0.5000 mg | Freq: Four times a day (QID) | RESPIRATORY_TRACT | Status: DC

## 2017-07-27 MED ORDER — MORPHINE SULFATE (PF) 2 MG/ML IV SOLN
1.0000 mg | Freq: Once | INTRAVENOUS | Status: AC
  Administered 2017-07-27: 1 mg via INTRAVENOUS
  Filled 2017-07-27: qty 1

## 2017-07-27 MED ORDER — LEVALBUTEROL HCL 0.63 MG/3ML IN NEBU
0.6300 mg | INHALATION_SOLUTION | Freq: Four times a day (QID) | RESPIRATORY_TRACT | Status: DC
  Administered 2017-07-27 (×2): 0.63 mg via RESPIRATORY_TRACT
  Filled 2017-07-27 (×2): qty 3

## 2017-07-27 MED ORDER — METOPROLOL TARTRATE 5 MG/5ML IV SOLN
5.0000 mg | Freq: Once | INTRAVENOUS | Status: AC
  Administered 2017-07-27: 5 mg via INTRAVENOUS
  Filled 2017-07-27: qty 5

## 2017-07-27 MED ORDER — METHYLPREDNISOLONE SODIUM SUCC 125 MG IJ SOLR
60.0000 mg | Freq: Two times a day (BID) | INTRAMUSCULAR | Status: DC
  Administered 2017-07-27: 60 mg via INTRAVENOUS
  Filled 2017-07-27: qty 2

## 2017-07-27 MED ORDER — METOPROLOL TARTRATE 5 MG/5ML IV SOLN
10.0000 mg | Freq: Once | INTRAVENOUS | Status: AC
  Administered 2017-07-27: 10 mg via INTRAVENOUS

## 2017-07-27 MED ORDER — DILTIAZEM LOAD VIA INFUSION
10.0000 mg | Freq: Once | INTRAVENOUS | Status: AC
  Administered 2017-07-27: 10 mg via INTRAVENOUS
  Filled 2017-07-27: qty 10

## 2017-07-27 MED ORDER — LEVALBUTEROL HCL 0.63 MG/3ML IN NEBU: 0.6300 mg | INHALATION_SOLUTION | Freq: Four times a day (QID) | RESPIRATORY_TRACT | Status: DC

## 2017-07-27 MED ORDER — DIGOXIN 0.25 MG/ML IJ SOLN
0.2500 mg | Freq: Once | INTRAMUSCULAR | Status: AC
  Administered 2017-07-27: 0.25 mg via INTRAVENOUS
  Filled 2017-07-27: qty 2

## 2017-07-27 MED ORDER — MORPHINE SULFATE (PF) 2 MG/ML IV SOLN
2.0000 mg | Freq: Once | INTRAVENOUS | Status: AC
  Administered 2017-07-27: 2 mg via INTRAVENOUS
  Filled 2017-07-27: qty 1

## 2017-07-27 MED ORDER — FUROSEMIDE 10 MG/ML IJ SOLN
INTRAMUSCULAR | Status: AC
  Administered 2017-07-27: 09:00:00
  Filled 2017-07-27: qty 2

## 2017-07-27 MED ORDER — SIMVASTATIN 10 MG PO TABS
10.0000 mg | ORAL_TABLET | Freq: Every day | ORAL | Status: DC
  Administered 2017-07-27: 10 mg via ORAL
  Filled 2017-07-27: qty 1

## 2017-07-27 MED ORDER — DILTIAZEM HCL 100 MG IV SOLR
5.0000 mg/h | INTRAVENOUS | Status: DC
  Administered 2017-07-27: 5 mg/h via INTRAVENOUS
  Filled 2017-07-27 (×4): qty 100

## 2017-07-27 MED ORDER — PERFLUTREN LIPID MICROSPHERE
1.0000 mL | INTRAVENOUS | Status: AC | PRN
  Administered 2017-07-27: 3 mL via INTRAVENOUS
  Filled 2017-07-27: qty 10

## 2017-07-27 MED ORDER — RIVAROXABAN 20 MG PO TABS
20.0000 mg | ORAL_TABLET | Freq: Every day | ORAL | Status: DC
  Administered 2017-07-27: 20 mg via ORAL
  Filled 2017-07-27: qty 1

## 2017-07-27 NOTE — Progress Notes (Signed)
[facility]                                                                              Patient Demographics  Troy Jackson, is a 72 y.o. male, DOB - 1945-10-01, BMW:[id number]  Admit date - 07/24/2017   Admitting Physician Karmen Bongo, MD  Outpatient Primary MD for the patient is Crist Infante, MD  Outpatient specialists:   LOS - 3  days   Chief Complaint  Patient presents with  . Shortness of Breath       Brief summary   Troy Jackson is a 72 y.o. male with medical history significant of HLD; diverticulitis; CAD; and COPD with ongoing tobacco dependence presented with shortness of breath.  She reported dyspnea on exertion with fever for last 4 days prior to admission, pleuritic chest pain. SOB is all the time but worse with walking around, with coughing, productive with yellowish phlegm.  Patient had a temp of 101. degrees Fahrenheit.  In ED patient was hypoxic with O2 sat in 80s, chest x-ray showed right-sided pneumonia. 5/16 am with AFib RVR-Cardizem gtt  Assessment & Plan    Principal Problem: Acute hypoxic respiratory failure, sepsis due to pneumonia and COPD exacerbation --Met sepsis criteria in ED  - continue Rocephin/azithromycin, scheduled nebs - Less wheezing today, cutdown Solu-Medrol dose - Change albuterol to xopenex due to rapid A. Fib - Wean O2, out of bed, ambulate  Afib with RVR since 5/16early am -New problem likely triggered by above pneumonia and COPD exacerbation -Started on Cardizem drip now on maximum rate of 15 mg/h, was suboptimally controlled on this for couple of hours this morning however now HR improving, also got single dose of IV Lopressor and digoxin -cardiology consult -If remains in A. Fib will need long-term anticoagulation -Check echocardiogram and TSH -change albuterol nebs to xopenex    Hyperlipidemia -Continue simvastatin    COPD (chronic obstructive pulmonary disease) (Early), exacerbation with  wheezing -Continue scheduled nebs, O2, Dulera, flutter valve -less wheezing today, cut down solumedrol dose    CAD (coronary artery disease) -Negative troponins, currently stable, continue aspirin, statin -last PCI/stent to L CFX in 2017  Anxiety -Continue xanax as needed   Code Status: Full CODE STATUS DVT Prophylaxis:  Lovenox Family Communication: wife and dtr at bedside  Disposition Plan: home in 2-3days when HR improved, breathing better  Time Spent in minutes   50 minutes  Procedures:  None  Consultants:   None  Antimicrobials:   IV Zithromax 5/14  IV Rocephin 5/14   Medications  Scheduled Meds: . aspirin  81 mg Oral Daily  . benzonatate  100 mg Oral TID  . enoxaparin (LOVENOX) injection  40 mg Subcutaneous Q24H  . guaiFENesin  1,200 mg Oral BID  . ipratropium  0.5 mg Nebulization Q6H  . levalbuterol  0.63 mg Nebulization Q6H  . mouth rinse  15 mL Mouth Rinse BID  . methylPREDNISolone (SOLU-MEDROL) injection  60 mg Intravenous Q6H  . metoprolol tartrate      . mometasone-formoterol  2 puff Inhalation BID  . pantoprazole  40 mg Oral Daily  . simvastatin  20 mg  Oral q1800   Continuous Infusions: . azithromycin Stopped (07/26/17 1348)  . cefTRIAXone (ROCEPHIN)  IV Stopped (07/26/17 1231)  . diltiazem (CARDIZEM) infusion 15 mg/hr (07/27/17 0358)   PRN Meds:.ALPRAZolam, guaiFENesin-dextromethorphan, levalbuterol   Antibiotics   Anti-infectives (From admission, onward)   Start     Dose/Rate Route Frequency Ordered Stop   07/25/17 1200  azithromycin (ZITHROMAX) 500 mg in sodium chloride 0.9 % 250 mL IVPB  Status:  Discontinued     500 mg 250 mL/hr over 60 Minutes Intravenous  Once 07/24/17 1722 07/25/17 1033   07/25/17 1200  cefTRIAXone (ROCEPHIN) 1 g in sodium chloride 0.9 % 100 mL IVPB  Status:  Discontinued     1 g 200 mL/hr over 30 Minutes Intravenous  Once 07/24/17 1722 07/25/17 1033   07/25/17 1130  azithromycin (ZITHROMAX) 500 mg in sodium  chloride 0.9 % 250 mL IVPB     500 mg 250 mL/hr over 60 Minutes Intravenous Every 24 hours 07/25/17 1033 07/31/17 1129   07/25/17 1130  cefTRIAXone (ROCEPHIN) 1 g in sodium chloride 0.9 % 100 mL IVPB     1 g 200 mL/hr over 30 Minutes Intravenous Every 24 hours 07/25/17 1033 07/31/17 1129   07/24/17 1300  cefTRIAXone (ROCEPHIN) 1 g in sodium chloride 0.9 % 100 mL IVPB     1 g 200 mL/hr over 30 Minutes Intravenous  Once 07/24/17 1251 07/24/17 1340   07/24/17 1300  azithromycin (ZITHROMAX) 500 mg in sodium chloride 0.9 % 250 mL IVPB     500 mg 250 mL/hr over 60 Minutes Intravenous  Once 07/24/17 1251 07/24/17 1501        Subjective:   Continues to feel short of breath, have chest tightness, and cough  Objective:   Vitals:   07/27/17 0725 07/27/17 0732 07/27/17 0737 07/27/17 0738  BP: (!) 131/113  (!) 137/93   Pulse: (!) 109  100   Resp: 20  19   Temp:    98 F (36.7 C)  TempSrc:    Oral  SpO2: 93% 93% 97%   Weight:      Height:        Intake/Output Summary (Last 24 hours) at 07/27/2017 1211 Last data filed at 07/27/2017 6063 Gross per 24 hour  Intake 575.96 ml  Output 1500 ml  Net -924.04 ml     Wt Readings from Last 3 Encounters:  07/24/17 96.5 kg (212 lb 11.9 oz)  01/04/17 89.4 kg (197 lb)  05/04/16 91.2 kg (201 lb)     Exam Gen: Awake, Alert, Oriented X 3, flushed, uncomfortable appearing HEENT: positive JVD Lungs: improved air movement, no wheezes audible today, a few basilar crackles CVS: S1-S2 irregularly irregular, tachycardic Abd: soft, Non tender, non distended, BS present Extremities: trace edema Skin: no new rashes  Psych: anxious   Data Reviewed:  I have personally reviewed following labs and imaging studies  Micro Results Recent Results (from the past 240 hour(s))  Blood culture (routine x 2)     Status: None (Preliminary result)   Collection Time: 07/24/17 12:55 PM  Result Value Ref Range Status   Specimen Description BLOOD LEFT  ANTECUBITAL  Final   Special Requests   Final    BOTTLES DRAWN AEROBIC AND ANAEROBIC Blood Culture adequate volume   Culture   Final    NO GROWTH 3 DAYS Performed at [facility], [address], [place], [place] [address]    Report Status PENDING  Incomplete  Blood culture (routine  x 2)     Status: None (Preliminary result)   Collection Time: 07/24/17  1:07 PM  Result Value Ref Range Status   Specimen Description BLOOD RIGHT ANTECUBITAL  Final   Special Requests   Final    BOTTLES DRAWN AEROBIC AND ANAEROBIC Blood Culture results may not be optimal due to an excessive volume of blood received in culture bottles   Culture   Final    NO GROWTH 3 DAYS Performed at [facility] [address] [place], [place] [address]    Report Status PENDING  Incomplete  MRSA PCR Screening     Status: None   Collection Time: 07/27/17  1:40 AM  Result Value Ref Range Status   MRSA by PCR NEGATIVE NEGATIVE Final    Comment:        The GeneXpert MRSA Assay (FDA approved for NASAL specimens only), is one component of a comprehensive MRSA colonization surveillance program. It is not intended to diagnose MRSA infection nor to guide or monitor treatment for MRSA infections. Performed at [facility], [place] [address] [place], [place] [address]     Radiology Reports Dg Chest [place] 1 View  Result Date: 07/24/2017 CLINICAL DATA:  Shortness of breath, cough, fever EXAM: PORTABLE CHEST 1 VIEW COMPARISON:  02/03/2016 FINDINGS: There is right upper lobe airspace disease most consistent with pneumonia. There is no pleural effusion or pneumothorax. The heart and mediastinal contours are unremarkable. The osseous structures are unremarkable. IMPRESSION: Right upper lobe pneumonia. Followup PA and lateral chest X-ray is recommended in 3-4 weeks following trial of antibiotic therapy to ensure resolution and exclude underlying malignancy. Electronically Signed   By: Kathreen Devoid   On: 07/24/2017  12:37    Lab Data:  CBC: Recent Labs  Lab 07/24/17 1228 07/25/17 0328 07/26/17 0317  WBC 17.7* 11.4* 9.7  NEUTROABS 14.8* 9.8*  --   HGB 14.2 11.4* 11.6*  HCT 42.4 35.0* 36.2*  MCV 95.1 96.4 97.8  PLT 320 257 956   Basic Metabolic Panel: Recent Labs  Lab 07/24/17 1228 07/25/17 0328 07/26/17 0317  NA 135 138 140  K 4.3 4.5 3.4*  CL 99* 103 103  CO2 '26 25 27  '$ GLUCOSE 143* 153* 111*  BUN '10 11 20  '$ CREATININE 1.31* 1.14 1.10  CALCIUM 9.0 8.1* 8.4*   GFR: Estimated Creatinine Clearance: 75.4 mL/min (by C-G formula based on SCr of 1.1 mg/dL). Liver Function Tests: Recent Labs  Lab 07/24/17 1228  AST 20  ALT 33  ALKPHOS 77  BILITOT 1.5*  PROT 7.2  ALBUMIN 3.1*   No results for input(s): LIPASE, AMYLASE in the last 168 hours. No results for input(s): AMMONIA in the last 168 hours. Coagulation Profile: No results for input(s): INR, PROTIME in the last 168 hours. Cardiac Enzymes: Recent Labs  Lab 07/27/17 0348 07/27/17 0849  TROPONINI <0.03 <0.03   BNP (last 3 results) No results for input(s): PROBNP in the last 8760 hours. HbA1C: No results for input(s): HGBA1C in the last 72 hours. CBG: No results for input(s): GLUCAP in the last 168 hours. Lipid Profile: No results for input(s): CHOL, HDL, LDLCALC, TRIG, CHOLHDL, LDLDIRECT in the last 72 hours. Thyroid Function Tests: No results for input(s): TSH, T4TOTAL, FREET4, T3FREE, THYROIDAB in the last 72 hours. Anemia Panel: No results for input(s): VITAMINB12, FOLATE, FERRITIN, TIBC, IRON, RETICCTPCT in the last 72 hours. Urine analysis:    Component Value Date/Time   COLORURINE AMBER (A) 07/24/2017 1228  APPEARANCEUR CLEAR 07/24/2017 1228   LABSPEC 1.016 07/24/2017 1228   PHURINE 6.0 07/24/2017 1228   GLUCOSEU 50 (A) 07/24/2017 1228   HGBUR NEGATIVE 07/24/2017 [place] 07/24/2017 [place] 07/24/2017 1228   PROTEINUR 30 (A) 07/24/2017 1228   NITRITE NEGATIVE  07/24/2017 1228   LEUKOCYTESUR NEGATIVE 07/24/2017 1228     Domenic Polite M.D. [facility] 07/27/2017, 12:11 PM  Page via [email or web address] - password [email or web address]  Call night coverage person covering after 7pm

## 2017-07-27 NOTE — Progress Notes (Signed)
Patient heart rate elevated 150-160's sustaining .Patient complaining shortness of breath and palpitations .12 lead EKG obtained confirms rhythm change atrial fibrillation with rvr .Tama Gander NP notified.New orders received and carried out.

## 2017-07-27 NOTE — Progress Notes (Signed)
  Echocardiogram 2D Echocardiogram has been performed.  Troy Jackson 07/27/2017, 1:56 PM

## 2017-07-27 NOTE — Progress Notes (Signed)
ANTICOAGULATION CONSULT NOTE - Initial Consult  Pharmacy Consult for Rivaroxaban Indication: atrial fibrillation  Allergies  Allergen Reactions  . Crestor [Rosuvastatin Calcium] Other (See Comments)    All statins: Myalgias     Patient Measurements: Height:  (185.4 cm) Weight: 212 lb 11.9 oz (96.5 kg) IBW/kg (Calculated) : 79.9   Vital Signs: Temp: 97.9 F (36.6 C) (05/16 1219) Temp Source: Oral (05/16 1219) BP: 124/83 (05/16 1219) Pulse Rate: 80 (05/16 1219)  Labs: Recent Labs    07/25/17 0328 07/26/17 0317 07/27/17 0348 07/27/17 0849  HGB 11.4* 11.6*  --   --   HCT 35.0* 36.2*  --   --   PLT 257 283  --   --   CREATININE 1.14 1.10 0.86  --   TROPONINI  --   --  <0.03 <0.03    Estimated Creatinine Clearance: 96.4 mL/min (by C-G formula based on SCr of 0.86 mg/dL).   Medical History: Past Medical History:  Diagnosis Date  . Anxiety   . COPD (chronic obstructive pulmonary disease) (HCC)   . Coronary artery disease 2017   stent  . Diverticulitis   . GERD (gastroesophageal reflux disease)   . Hyperlipidemia   . Tobacco abuse 2017      Assessment: 72 yo male with new onset afib with RVR. Decision has been made to start patient on anticoagulation. Pharmacy has been asked to dose rivaroxaban for patient. CrCL~96.4 ml/min.     Plan:  D/c Enoxaparin VTE px Start Rivaroxaban  Daily with dinner Monitor s/sx of bleeding and Hgb  Maddyx Wieck A. Jeanella Craze, PharmD, BCPS Clinical Pharmacist [place] Pager: [phone]  07/27/2017,2:19 PM

## 2017-07-27 NOTE — Progress Notes (Signed)
Patient remains in atrial fibrillation heart rate 140's .Tama Gander NP notified.New orders received and carried out.

## 2017-07-27 NOTE — Progress Notes (Signed)
Patient's HR was running high (115-120bpm).  Will attempt at a later time.

## 2017-07-27 NOTE — Progress Notes (Signed)
Patient complaining substernal chest pain 8/10 non radiating requesting pain medication.IV cardizem drip started for atrial fibrillation.Text paged Tama Gander NP.

## 2017-07-27 NOTE — Progress Notes (Signed)
Shift event note:  Notified by RN at approx midnite that pt had converted to A-Fib w/ RVR with rate in the 140-150's. EKG obtained and reviewed, reveals A-Fib/RVR w/ HR of 158 and ST depression in inferior and lateral leads. Pt initially asymptomatic BP of 144/96. Orders placed for Lopressor 5 mg IV which provided minimal improvement in HR. Cardizem bolus of 10 mg given w/o significant improvement. Pt ultimately started on Cardizem qtt to be titrated to goal HR of 65-105. Approx 2 hours after onset of rhythm change pt did report some mild non-radiating CP that was relieved w/ Morphine 2 mg IV. Pt also quite anxious so family was called and have arrived at bedside. RN reports pt currently CP free and resting.  Assessment/Plan: 1. A-Fib/RVR: New onset. Associated w/ brief episode of CP 2 hours after onset relieved w/ IV Morphine. Currently on Cardizem drip and has also received Digoxin 0.25 mg IV. Will cycle troponin's.  2-D Echo ordered for am. Will add additional IV Lopressor as needed for additional rate control.  Level of care has been changed to SDU. Discussed pt with Dr Toniann Fail who has also reviewed EKG and is in agreement w/ plan. Will follow troponin's and continue to monitor closely on SDU.   Leanne Chang, NP-C [facility] Pager [phone]

## 2017-07-27 NOTE — Progress Notes (Signed)
Patient cardizem drip at 15 mg/hr and heart rate remains 130'-140's atrial fibrillation.Tama Gander NP notified.New order received and carried out.

## 2017-07-27 NOTE — Consult Note (Addendum)
Cardiology Consultation:   Patient ID: Troy Jackson; [id number]; [birth year]   Admit date: 07/24/2017 Date of Consult: 07/27/2017  Primary Care Provider: Rodrigo Ran, MD Primary Cardiologist: Peter Swaziland, MD  Primary Electrophysiologist:  NA   Patient Profile:   Troy Jackson is a 72 y.o. male with a hx of CAD with stent to LCX  01/2016, HLD, tobacco use, COPD, GERD, hx of brady with BB, also intolerant to Brilinta due to dyspnea who is being seen today for the evaluation of atrial fib RVR at the request of Dr. Jomarie Longs.  History of Present Illness:   Troy Jackson has a hx of CAD with stent to LCX  01/2016, HLD, tobacco use stopped about a year ago, COPD, GERD, hx of brady with BB, also intolerant to Brilinta due to dyspnea and had been doing well.    Admitted 07/24/17 with SOB, fever and DOE.  Pain was upper lung fields and fever to 101.  His sp02 was in the 80s on arrival and CXR with dense R sided PNA.  lactate was elevated.  Dx with sepsis, PNA, and treated with ABX.  Yesterday IV solumedrol added for profound wheezing with his COPD.  Continue on nebs.    BNP on admit 120 Troponin poc was 0.00  Around 0300 he developed a fib with RVR rate at 158 EKG, I personally reviewed, and ST depression most likely due to rate  EKG on admit SR with no acute changes with a fib +ST depression. Now in SR this has improved.  EKG I personally reviewed.    Tele I personally reviewed and A fib with RVR now SR with occ short bursts of atrial fib with RVR.  troponins today neg.  Pt had been on duoneb, now changed to xopenex.  Has rec'd IV lopressor, IV dig and dilt drip.   Has rec'd IV lasix today.    Currently he tells me the a fib caused him to feel terrible last night and this AM.  He converted to Sr when his minister walked in the room.  Now feels much better.  Prior to my exam he went to [facility] and is now stressed.  Does have some chest pressure.  This has been present off and on since admit.  We  discussed atrial fib and he is aware of this as his brother has.     On IV dilt at 15. No bleeding hx.  Past Medical History:  Diagnosis Date  . Anxiety   . COPD (chronic obstructive pulmonary disease) (HCC)   . Coronary artery disease 2017   stent  . Diverticulitis   . GERD (gastroesophageal reflux disease)   . Hyperlipidemia   . Tobacco abuse 2017    Past Surgical History:  Procedure Laterality Date  . CARDIAC CATHETERIZATION N/A 02/03/2016   Procedure: Left Heart Cath and Coronary Angiography;  Surgeon: Peter M Swaziland, MD;  Location: [facility] INVASIVE CV LAB;  Service: Cardiovascular;  Laterality: N/A;  . CARDIAC CATHETERIZATION N/A 02/03/2016   Procedure: Coronary Stent Intervention;  Surgeon: Peter M Swaziland, MD;  Location: [facility] INVASIVE CV LAB;  Service: Cardiovascular;  Laterality: N/A;  . CORONARY ANGIOPLASTY WITH STENT PLACEMENT  02/03/2016  . SHOULDER SURGERY Right    as child  . SHOULDER SURGERY Left    as child     Home Medications:  Prior to Admission medications   Medication Sig Start Date End Date Taking? Authorizing Provider  acetaminophen (TYLENOL 8 HOUR) 650 MG CR tablet Take 1,300  mg by mouth every 8 (eight) hours as needed for pain or fever.   Yes [provider]  albuterol (PROVENTIL HFA;VENTOLIN HFA) 108 (90 Base) MCG/ACT inhaler Inhale 1-2 puffs into the lungs every 6 (six) hours as needed for wheezing or shortness of breath.   Yes [provider]  ALPRAZolam Prudy Feeler) 0.5 MG tablet Take 0.5 mg by mouth 3 (three) times daily as needed for anxiety. For anxiety 01/13/11  Yes [provider]  aspirin 81 MG chewable tablet Chew 1 tablet (81 mg total) by mouth daily. 02/04/16  Yes Regalado, Belkys A, MD  budesonide-formoterol (SYMBICORT) 160-4.5 MCG/ACT inhaler Inhale 2 puffs into the lungs 2 (two) times daily.   Yes [provider]  Liniments (BLUE-EMU SUPER STRENGTH) CREA Apply 1 application topically as needed (back pain).   Yes  [provider]  nitroGLYCERIN (NITROSTAT) 0.4 MG SL tablet Place 1 tablet (0.4 mg total) under the tongue every 5 (five) minutes as needed for chest pain. 01/04/17  Yes Swaziland, Peter M, MD  pantoprazole (PROTONIX) 40 MG tablet Take 1 tablet (40 mg total) by mouth daily. 02/05/16  Yes Regalado, Belkys A, MD  Phenyleph-Doxylamine-DM-APAP (NYQUIL SEVERE COLD/FLU) 5-6.25-10-325 MG/15ML LIQD Take 15 mLs by mouth at bedtime as needed (for sleep).   Yes [provider]  simvastatin (ZOCOR) 20 MG tablet Take 20 mg by mouth daily at 6 PM.  07/10/17  Yes [provider]    Inpatient Medications: Scheduled Meds: . aspirin  81 mg Oral Daily  . benzonatate  100 mg Oral TID  . enoxaparin (LOVENOX) injection  40 mg Subcutaneous Q24H  . guaiFENesin  1,200 mg Oral BID  . ipratropium  0.5 mg Nebulization Q6H  . levalbuterol  0.63 mg Nebulization Q6H  . mouth rinse  15 mL Mouth Rinse BID  . methylPREDNISolone (SOLU-MEDROL) injection  60 mg Intravenous Q6H  . metoprolol tartrate      . mometasone-formoterol  2 puff Inhalation BID  . pantoprazole  40 mg Oral Daily  . simvastatin  20 mg Oral q1800   Continuous Infusions: . azithromycin Stopped (07/26/17 1348)  . cefTRIAXone (ROCEPHIN)  IV Stopped (07/26/17 1231)  . diltiazem (CARDIZEM) infusion 15 mg/hr (07/27/17 0358)   PRN Meds: ALPRAZolam, guaiFENesin-dextromethorphan, levalbuterol  Allergies:    Allergies  Allergen Reactions  . Crestor [Rosuvastatin Calcium] Other (See Comments)    All statins: Myalgias     Social History:   Social History   Socioeconomic History  . Marital status: Married    Spouse name: Not on file  . Number of children: 2  . Years of education: Not on file  . Highest education level: Not on file  Occupational History  . Occupation: retired Sales promotion account executive; works as a Lobbyist  . Financial resource strain: Not on file  . Food insecurity:    Worry: Not on file    Inability: Not  on file  . Transportation needs:    Medical: Not on file    Non-medical: Not on file  Tobacco Use  . Smoking status: Former Smoker    Packs/day: 1.00    Years: 60.00    Pack years: 60.00    Types: Cigarettes    Last attempt to quit: 2017    Years since quitting: 2.3  . Smokeless tobacco: Never Used  Substance and Sexual Activity  . Alcohol use: Yes    Alcohol/week: 12.0 oz    Types: 20 Cans of beer per week  Comment: cannot remember when his last day was without a drink  . Drug use: No  . Sexual activity: Not on file  Lifestyle  . Physical activity:    Days per week: Not on file    Minutes per session: Not on file  . Stress: Not on file  Relationships  . Social connections:    Talks on phone: Not on file    Gets together: Not on file    Attends religious service: Not on file    Active member of club or organization: Not on file    Attends meetings of clubs or organizations: Not on file    Relationship status: Not on file  . Intimate partner violence:    Fear of current or ex partner: Not on file    Emotionally abused: Not on file    Physically abused: Not on file    Forced sexual activity: Not on file  Other Topics Concern  . Not on file  Social History Narrative  . Not on file    Family History:    Family History  Problem Relation Age of Onset  . Heart disease Father   . Lung cancer Father   . Heart attack Father   . Alcohol abuse Father   . Alzheimer's disease Mother   . Heart attack Paternal Grandfather   . Alcohol abuse Paternal Grandfather   . Alcohol abuse Brother      ROS:  Please see the history of present illness.  General:no colds or fevers, no weight changes Skin:no rashes or ulcers HEENT:no blurred vision, no congestion CV:see HPI PUL:see HPI GI:no diarrhea constipation or melena, no indigestion GU:no hematuria, no dysuria MS:no joint pain, no claudication Neuro:no syncope, no lightheadedness Endo:no diabetes, no thyroid disease  All  other ROS reviewed and negative.     Physical Exam/Data:   Vitals:   07/27/17 0725 07/27/17 0732 07/27/17 0737 07/27/17 0738  BP: (!) 131/113  (!) 137/93   Pulse: (!) 109  100   Resp: 20  19   Temp:    98 F (36.7 C)  TempSrc:    Oral  SpO2: 93% 93% 97%   Weight:      Height:        Intake/Output Summary (Last 24 hours) at 07/27/2017 1014 Last data filed at 07/27/2017 0936 Gross per 24 hour  Intake 775.96 ml  Output 1600 ml  Net -824.04 ml   Filed Weights   07/24/17 2013  Weight: 212 lb 11.9 oz (96.5 kg)   Body mass index is 28.07 kg/m.  General:  Well nourished, well developed, in no acute distress, though agitated about not having items where he can reach.  HEENT: normal Lymph: no adenopathy Neck: no JVD Endocrine:  No thryomegaly Vascular: No carotid bruits; pedal pulses 2+ bilaterally  Cardiac:  normal S1, S2; RRR; no murmur, gallup rub or click  Lungs:  clear to auscultation bilaterally, occ wheezing, occ rhonchi no rales  Abd: soft, nontender, no hepatomegaly  Ext: no edema Musculoskeletal:  No deformities, BUE and BLE strength normal and equal Skin: warm and dry  Neuro:  Alert and oriented X 3, MAE, follows commands, answers questions approp, no focal abnormalities noted Psych:  Normal affect     Relevant CV Studies: Cardiac cath 02/03/2016   Procedures   Coronary Stent Intervention  Left Heart Cath and Coronary Angiography  Conclusion     The left ventricular systolic function is normal.  LV end diastolic pressure is mildly  elevated.  The left ventricular ejection fraction is 55-65% by visual estimate.  There is no mitral valve regurgitation.  Ost Cx to Prox Cx lesion, 35 %stenosed.  Mid Cx lesion, 30 %stenosed.  Prox RCA to Mid RCA lesion, 30 %stenosed.  Prox Cx to Mid Cx lesion, 90 %stenosed.  Post intervention, there is a 0% residual stenosis.  A STENT SYNERGY DES 3X16 drug eluting stent was successfully placed.   1. 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 Severe single vessel obstructive CAD involving the mid LCx 3. Normal LV function 4. Mildly elevated LVEDP 5. Successful stenting of the mid LCx with DES  Plan: DAPT for one year. Smoking cessation. Risk factor modification. Anticipate DC in am.     Diagnostic Diagram       Post-Intervention Diagram         Echo 02/03/16  Study Conclusions  - Left ventricle: The cavity size was normal. Wall thickness was   normal. Systolic function was normal. The estimated ejection   fraction was in the range of 60% to 65%. Wall motion was normal;   there were no regional wall motion abnormalities. Features are   consistent with a pseudonormal left ventricular filling pattern,   with concomitant abnormal relaxation and increased filling   pressure (grade 2 diastolic dysfunction). - Aortic valve: Mildly calcified annulus. - Left atrium: The atrium was mildly dilated. - Right atrium: The atrium was mildly dilated. - Pericardium, extracardiac: A trivial pericardial effusion was   identified. Laboratory Data:  Chemistry Recent Labs  Lab 07/24/17 1228 07/25/17 0328 07/26/17 0317  NA 135 138 140  K 4.3 4.5 3.4*  CL 99* 103 103  CO2 GLUCOSE 143* 153* 111*  BUN CREATININE 1.31* 1.14 1.10  CALCIUM 9.0 8.1* 8.4*  GFRNONAA 53* >60 >60  GFRAA >60 >60 >60  ANIONGAP Recent Labs  Lab 07/24/17 1228  PROT 7.2  ALBUMIN 3.1*  AST 20  ALT 33  ALKPHOS 77  BILITOT 1.5*   Hematology Recent Labs  Lab 07/24/17 1228 07/25/17 0328 07/26/17 0317  WBC 17.7* 11.4* 9.7  RBC 4.46 3.63* 3.70*  HGB 14.2 11.4* 11.6*  HCT 42.4 35.0* 36.2*  MCV 95.1 96.4 97.8  MCH 31.8 31.4 31.4  MCHC 33.5 32.6 32.0  RDW 12.6 13.1 12.3  PLT 320 257 283   Cardiac Enzymes Recent Labs  Lab 07/27/17 0348 07/27/17 0849  TROPONINI <0.03 <0.03    Recent Labs  Lab 07/24/17 1244  TROPIPOC 0.00      BNP Recent Labs  Lab 07/24/17 1228  BNP 120.1*    DDimer No results for input(s): DDIMER in the last 168 hours.  Radiology/Studies:  Dg Chest Port 1 View  Result Date: 07/24/2017 CLINICAL DATA:  Shortness of breath, cough, fever EXAM: PORTABLE CHEST 1 VIEW COMPARISON:  02/03/2016 FINDINGS: There is right upper lobe airspace disease most consistent with pneumonia. There is no pleural effusion or pneumothorax. The heart and mediastinal contours are unremarkable. The osseous structures are unremarkable. IMPRESSION: Right upper lobe pneumonia. Followup PA and lateral chest X-ray is recommended in 3-4 weeks following trial of antibiotic therapy to ensure resolution and exclude underlying malignancy. Electronically Signed   By: Elige Ko   On: 07/24/2017 12:37    Assessment and Plan:   1. A fib with RVR in combination of PNA and COPD.  CHA2DS2VASc of  1, on low dose lovenox,  Now on Dilt drip at 15.  Converted to SR at 10:18 AM. Has had brief runs of PAF up to 3 sec since he converted.  Dr. Anne Fu to see.  Anticoagulation for now with Xarelto --he has had stent but no MI but with his age will begin.    2.   PNA Rt lung and COPD per primary team and albuterol nebs now with xopenex instead.    3.   CAD with hx of LCX stent 2017. troponins neg. He complains of some chest discomfort but this may be from PNA.   4.   Hypoxia secondary to # 2,   5.   Anxiety on xanax   6.    HLD continue statin.   For questions or updates, please contact [facility] Please consult [email or web address] for contact info under Cardiology/STEMI.   Signed, Nada Boozer, NP  07/27/2017 10:14 AM   Personally seen and examined. Agree with above.  Currently in sinus rhythm.  He is on diltiazem drip.  No chest pain.  Shortness of breath is improving from COPD.  GEN: Well nourished, well developed, in no acute distress  HEENT: normal  Neck: no JVD, carotid bruits, or masses Cardiac: Regular rate and rhythm; no  murmurs, rubs, or gallops,no edema  Respiratory:  clear to auscultation bilaterally, normal work of breathing GI: soft, nontender, nondistended, + BS MS: no deformity or atrophy  Skin: warm and dry, no rash Neuro:  Alert and Oriented x 3, Strength and sensation are intact Psych: euthymic mood, full affect  Lab work personally reviewed-creatinine clearance greater than 50 Echocardiogram: EF normal, mildly dilated left atrium  Cardiac catheterization: Mid circumflex stent DES  Assessment and plan:  New onset paroxysmal atrial fibrillation - I am concerned that given his 12-hour duration of atrial fibrillation and strong risk factors for repeat atrial fibrillation in the future that we should go ahead and place him on anticoagulation for stroke prevention.  He has a chads vas score of 2 for age and CAD.  I would stop aspirin when starting Xarelto.  Pharmacy team.  His daughter is a Associate Professor.  He seemed a little bit hesitant about the medication.  Explained the risks versus the benefits. - We will also place him on diltiazem CD 120 mg once a day.  Hopefully this will help minimize future episodes of atrial fibrillation.  This may also be a good medicine given his underlying COPD.  He has had trouble in the past with bradycardia and metoprolol. Stopping diltiazem drip.  Note, his atrial ablation was likely exacerbated by his COPD exacerbation.  CAD -Currently stable.  During rapid ventricular response, they were marked ST segment depression noted when his heart rate was elevated.  Continues to try to avoid episodes of atrial fibrillation.  Circumflex stent 2017.  Off of dual antiplatelet therapy.  COPD - Per primary team.  Improved.  Better air movement.  Donato Schultz, MD

## 2017-07-28 LAB — BASIC METABOLIC PANEL
Anion gap: 8 (ref 5–15)
BUN: 29 mg/dL — ABNORMAL HIGH (ref 6–20)
CO2: 28 mmol/L (ref 22–32)
CREATININE: 1.01 mg/dL (ref 0.61–1.24)
Calcium: 8.6 mg/dL — ABNORMAL LOW (ref 8.9–10.3)
Chloride: 104 mmol/L (ref 101–111)
GFR calc Af Amer: >60 mL/min (ref >60)
Glucose, Bld: 183 mg/dL — ABNORMAL HIGH (ref 65–99)
Potassium: 4.1 mmol/L (ref 3.5–5.1)
SODIUM: 140 mmol/L (ref 135–145)

## 2017-07-28 LAB — CBC
HCT: 33.1 % — ABNORMAL LOW (ref 39.0–52.0)
Hemoglobin: 10.9 g/dL — ABNORMAL LOW (ref 13.0–17.0)
MCH: 30.9 pg (ref 26.0–34.0)
MCHC: 32.9 g/dL (ref 30.0–36.0)
MCV: 93.8 fL (ref 78.0–100.0)
PLATELETS: 329 10*3/uL (ref 150–400)
RBC: 3.53 MIL/uL — ABNORMAL LOW (ref 4.22–5.81)
RDW: 12.3 % (ref 11.5–15.5)
WBC: 9 10*3/uL (ref 4.0–10.5)

## 2017-07-28 MED ORDER — RIVAROXABAN (XARELTO) VTE STARTER PACK (15 & 20 MG): ORAL_TABLET | ORAL | 0 refills | Status: DC

## 2017-07-28 MED ORDER — LEVALBUTEROL HCL 0.63 MG/3ML IN NEBU
0.6300 mg | INHALATION_SOLUTION | Freq: Four times a day (QID) | RESPIRATORY_TRACT | Status: DC
  Administered 2017-07-28: 0.63 mg via RESPIRATORY_TRACT
  Filled 2017-07-28: qty 3

## 2017-07-28 MED ORDER — LEVALBUTEROL HCL 0.63 MG/3ML IN NEBU: 0.6300 mg | INHALATION_SOLUTION | Freq: Three times a day (TID) | RESPIRATORY_TRACT | Status: DC

## 2017-07-28 MED ORDER — PREDNISONE 50 MG PO TABS
50.0000 mg | ORAL_TABLET | Freq: Every day | ORAL | Status: DC
  Administered 2017-07-28: 50 mg via ORAL
  Filled 2017-07-28: qty 1

## 2017-07-28 MED ORDER — IPRATROPIUM BROMIDE 0.02 % IN SOLN
0.5000 mg | Freq: Three times a day (TID) | RESPIRATORY_TRACT | Status: DC
  Administered 2017-07-28: 0.5 mg via RESPIRATORY_TRACT
  Filled 2017-07-28: qty 2.5

## 2017-07-28 MED ORDER — PREDNISONE 20 MG PO TABS: ORAL_TABLET | ORAL | 0 refills | Status: DC

## 2017-07-28 MED ORDER — SIMVASTATIN 20 MG PO TABS: 10.0000 mg | ORAL_TABLET | Freq: Every day | ORAL | Status: DC

## 2017-07-28 MED ORDER — AMOXICILLIN-POT CLAVULANATE 875-125 MG PO TABS
1.0000 | ORAL_TABLET | Freq: Two times a day (BID) | ORAL | Status: DC
  Administered 2017-07-28: 1 via ORAL
  Filled 2017-07-28: qty 1

## 2017-07-28 MED ORDER — AMOXICILLIN-POT CLAVULANATE 875-125 MG PO TABS: 1.0000 | ORAL_TABLET | Freq: Two times a day (BID) | ORAL | 0 refills | Status: DC

## 2017-07-28 MED ORDER — DILTIAZEM HCL ER COATED BEADS 120 MG PO CP24: 120.0000 mg | ORAL_CAPSULE | Freq: Every day | ORAL | 0 refills | Status: DC

## 2017-07-28 MED ORDER — APIXABAN 5 MG PO TABS: 5.0000 mg | ORAL_TABLET | Freq: Two times a day (BID) | ORAL | 0 refills | Status: DC

## 2017-07-28 NOTE — Plan of Care (Signed)
Patient is making progress toward discharge goal, continues on antibiotics treatment

## 2017-07-28 NOTE — Plan of Care (Signed)
Patient is making progress toward discharge goal. Patient out of progressive care, no acute distress noted

## 2017-07-28 NOTE — Care Management Important Message (Signed)
Important Message  Patient Details  Name: Troy Jackson MRN: [id number] Date of Birth: [birth year]   Medicare Important Message Given:  Yes    [name] 07/28/2017, 1:41 PM

## 2017-07-28 NOTE — Discharge Instructions (Signed)

## 2017-07-28 NOTE — Progress Notes (Addendum)
Progress Note  Patient Name: Troy Jackson Date of Encounter: 07/28/2017  Primary Cardiologist: Peter Swaziland, MD   Subjective   Patient reports feeling much better this morning. Hopeful to go home today. Still hesitant to start anticoagulation and is concerned about the cost of DOAC therapy. Refused case management involvement to help determine financial burden of xarelto vs apixaban. He would like to discuss this further with Dr. Swaziland and Dr. Anne Fu. Educated patient on the risks associated with atrial fibrillation.   Inpatient Medications    Scheduled Meds: . aspirin  81 mg Oral Daily  . benzonatate  100 mg Oral TID  . diltiazem  120 mg Oral Daily  . guaiFENesin  1,200 mg Oral BID  . ipratropium  0.5 mg Nebulization TID  . levalbuterol  0.63 mg Nebulization QID  . mouth rinse  15 mL Mouth Rinse BID  . methylPREDNISolone (SOLU-MEDROL) injection  60 mg Intravenous Q12H  . mometasone-formoterol  2 puff Inhalation BID  . pantoprazole  40 mg Oral Daily  . rivaroxaban  20 mg Oral Q supper  . simvastatin  10 mg Oral q1800   Continuous Infusions: . azithromycin Stopped (07/27/17 1555)  . cefTRIAXone (ROCEPHIN)  IV Stopped (07/27/17 1450)   PRN Meds: ALPRAZolam, guaiFENesin-dextromethorphan, levalbuterol   Vital Signs    Vitals:   07/27/17 1816 07/27/17 2110 07/27/17 2227 07/28/17 0537  BP: 139/62  (!) 133/54 (!) 135/91  Pulse:   88 75  Resp:   (!) 24 16  Temp:   97.9 F (36.6 C) 97.7 F (36.5 C)  TempSrc:      SpO2:  96% 95% 96%  Weight:      Height:        Intake/Output Summary (Last 24 hours) at 07/28/2017 0752 Last data filed at 07/28/2017 0011 Gross per 24 hour  Intake 588 ml  Output 700 ml  Net -112 ml   Filed Weights   07/24/17 2013  Weight: 212 lb 11.9 oz (96.5 kg)    Telemetry    NSR since ~10:00am 07/27/17 - Personally Reviewed  ECG    NSR, no STE/D no TWI (previously Afib RVR)- Personally Reviewed  Physical Exam   GEN: Laying in bed in  no acute distress.   Neck: No JVD, no carotid bruits Cardiac: RRR, no murmurs, rubs, or gallops.  Respiratory: scattered rhonchi, no wheezing or rales; still requiring O2 via [place] GI: NABS, Soft, nontender, non-distended  MS: No edema; No deformity. Neuro:  Nonfocal, moving all extremities spontaneously Psych: Normal affect   Labs    Chemistry Recent Labs  Lab 07/24/17 1228  07/26/17 0317 07/27/17 0348 07/28/17 0536  NA 135   < > 140 139 140  K 4.3   < > 3.4* 4.0 4.1  CL 99*   < > 103 108 104  CO2 26   < > GLUCOSE 143*   < > 111* 203* 183*  BUN 10   < > 20 17 29*  CREATININE 1.31*   < > 1.10 0.86 1.01  CALCIUM 9.0   < > 8.4* 8.5* 8.6*  PROT 7.2  --   --   --   --   ALBUMIN 3.1*  --   --   --   --   AST 20  --   --   --   --   ALT 33  --   --   --   --   ALKPHOS 77  --   --   --   --  BILITOT 1.5*  --   --   --   --   GFRNONAA 53*   < > >60 >60 >60  GFRAA >60   < > >60 >60 >60  ANIONGAP 10   < > < > = values in this interval not displayed.     Hematology Recent Labs  Lab 07/25/17 0328 07/26/17 0317 07/28/17 0536  WBC 11.4* 9.7 9.0  RBC 3.63* 3.70* 3.53*  HGB 11.4* 11.6* 10.9*  HCT 35.0* 36.2* 33.1*  MCV 96.4 97.8 93.8  MCH 31.4 31.4 30.9  MCHC 32.6 32.0 32.9  RDW 13.1 12.3 12.3  PLT 257 283 329    Cardiac Enzymes Recent Labs  Lab 07/27/17 0348 07/27/17 0849 07/27/17 1512  TROPONINI <0.03 <0.03 <0.03    Recent Labs  Lab 07/24/17 1244  TROPIPOC 0.00     BNP Recent Labs  Lab 07/24/17 1228  BNP 120.1*     DDimer No results for input(s): DDIMER in the last 168 hours.   Radiology    No results found.  Cardiac Studies   Echocardiogram 07/27/17: Study Conclusions  - Left ventricle: The cavity size was normal. Posterior wall   thickness was increased in a pattern of mild LVH. Systolic   function was normal. The estimated ejection fraction was in the   range of 60% to 65%. Wall motion was normal; there were no    regional wall motion abnormalities. - Aortic valve: Transvalvular velocity was within the normal range.   There was no stenosis. There was no regurgitation. - Mitral valve: Transvalvular velocity was within the normal range.   There was no evidence for stenosis. There was no regurgitation. - Right ventricle: The cavity size was normal. Wall thickness was   normal. Systolic function was normal. - Tricuspid valve: There was no regurgitation.  Patient Profile     72 y.o. male with PMH of CAD s/p PCI/DES to LCx 2017, HLD, COPD, GERD, bradycardia with BBlockers, intolerant to brilinta due to dyspnea, and former tobacco abuse, who presented to sepsis/PNA, found to have atrial fibrillation with RVR.   Assessment & Plan    1. New onset paroxysmal atrial fibrillation: Found to have HR in the 140s-150s with EKG revealing atrial fibrillation with RVR. Started on a diltiazem gtt and ultimately converted to NSR yesterday afternoon. He was transitioned to Diltiazem CD  daily and is maintaining NSR with HR in the 80s. Started on xarelto for CHADS2VASC score of 2 (age >38 and CAD). Suspect likely driven by infection.   - Continue diltiazem CD  - Recommend continuing xarelto at discharge, however patient is concerned about the financial burden. Refusing case management assistance to determine financially acceptable anticoagulation. He would like to discuss this further with Dr. Elane Fritz. Anne Fu  2. CAD s/p PCI/DES to LCx 2017: without anginal complaints. EKG while in afib RVR with ST depressions, resolved on repeat when HR was slower. ASA stopped at this time, given need to start Xarelto for atrial fibrillation, in an attempt to minimize bleeding risk.  - No further cardiac work-up needed at this time.   3. Sepsis/PNA/COPD exacerbation: found to have RUL PNA on CXR and was started on IV antibiotics. Also with significant wheezing and started on steroids for COPD exacerbation. Supportive care with  nebulizers, guaifenesin, and cough suppressants.  - Continue management per primary team  Patient requested to only follow with Dr. Swaziland; unfortunately next available appointment is not until 11/23/17. Patient was placed on  the waiting list for an earlier appointment.   For questions or updates, please contact [facility] Please consult [email or web address] for contact info under Cardiology/STEMI.      Signed, Beatriz Stallion, PA-C  07/28/2017, 7:52 AM   [phone]  Personally seen and examined. Agree with above.  Spoke with him about his atrial fibrillation once again paroxysmal and would like for him to be on anticoagulation given the high likelihood that we will see atrial for ablation again in the future.  He understands.  Diltiazem also on board.  He seems ready to go home.  He is alert, heart is regular rate and rhythm, telemetry does not show any adverse arrhythmias, no further atrial fibrillation personally reviewed, no edema.  Okay with discharge  Donato Schultz, MD

## 2017-07-28 NOTE — Discharge Summary (Signed)
Physician Discharge Summary  BRADYN SOWARD IHK:[id number] DOB: [birth year] DOA: 07/24/2017  PCP: Crist Infante, MD  Admit date: 07/24/2017 Discharge date: 07/28/2017  Time spent: 45 minutes  Recommendations for Outpatient Follow-up:  PCP in1 week, FU CXR in [place] in 1 month  Discharge Diagnoses:  Principal Problem:   Sepsis due to pneumonia ([place])   COPD exacerbation   Afib with RVR   Hyperlipidemia   COPD (chronic obstructive pulmonary disease) ([place])   CAD (coronary artery disease)   Atrial fibrillation with RVR ([place])   Discharge Condition: stable  Diet recommendation: heart healthy  Filed Weights   07/24/17 2013  Weight: 96.5 kg (212 lb 11.9 oz)    History of present illness:  Troy Ballo Wilsonis a 72 y.o.malewith medical history significant ofHLD; diverticulitis; CAD; and COPD with ongoing tobacco dependence presented with shortness of breath.  She reported dyspnea on exertion with fever for last 4 days prior to admission, pleuritic chest pain. SOB is all the time but worse with walking around, with coughing, productive with yellowish phlegm, fever  of 101  Hospital Course:  Acute hypoxic respiratory failure, sepsis due to pneumonia and COPD exacerbation --Met sepsis criteria in ED, treated with Rocephin/azithromycin, scheduled nebs and IV solumedrol -improved, weaned off O2, cultures negative -changed to Po Augmentin and prednisone to complete course -recommend FU CXR in 4-6weeks  Afib with RVR -5/16early am -New problem likely triggered by above pneumonia and COPD exacerbation -Started on Cardizem drip, later that day converted back to sinus rhythm with controlled rate -cardiology consulted -recommended Po cardizem and anticoagulation, after much discussion, pt now agrees to Eliquis at discharge -ECHO noted preserved EF and wall motion    Hyperlipidemia -Continue simvastatin    COPD (chronic obstructive pulmonary disease) ([place]),  exacerbation with wheezing -improved, Rx with nebs, O2, Dulera, flutter valve -prednisone taper at DC    CAD (coronary artery disease) -Negative troponins, currently stable, continue aspirin, statin -last PCI/stent to L CFX in 2017  Anxiety -Continue xanax as needed     Procedures:  ECHO  Consultations:  Cardiology  Discharge Exam: Vitals:   07/28/17 0537 07/28/17 0819  BP: (!) 135/91   Pulse: 75 82  Resp: 16 18  Temp: 97.7 F (36.5 C)   SpO2: 96% 97%    General: AAOx3 Cardiovascular: S1S2/RRR Respiratory: CTAB  Discharge Instructions   Discharge Instructions    Diet - low sodium heart healthy   Complete by:  As directed    Increase activity slowly   Complete by:  As directed      Allergies as of 07/28/2017      Reactions   Crestor [rosuvastatin Calcium] Other (See Comments)   All statins: Myalgias      Medication List    STOP taking these medications   aspirin 81 MG chewable tablet   TYLENOL 8 HOUR 650 MG CR tablet Generic drug:  acetaminophen     TAKE these medications   albuterol 108 (90 Base) MCG/ACT inhaler Commonly known as:  PROVENTIL HFA;VENTOLIN HFA Inhale 1-2 puffs into the lungs every 6 (six) hours as needed for wheezing or shortness of breath.   ALPRAZolam 0.5 MG tablet Commonly known as:  XANAX Take 0.5 mg by mouth 3 (three) times daily as needed for anxiety. For anxiety   amoxicillin-clavulanate 875-125 MG tablet Commonly known as:  AUGMENTIN Take 1 tablet by mouth every 12 (twelve) hours. For 3days   apixaban 5 MG Tabs tablet Commonly known as:  ELIQUIS  Take 1 tablet (5 mg total) by mouth 2 (two) times daily.   BLUE-EMU SUPER STRENGTH Crea Apply 1 application topically as needed (back pain).   budesonide-formoterol 160-4.5 MCG/ACT inhaler Commonly known as:  SYMBICORT Inhale 2 puffs into the lungs 2 (two) times daily.   diltiazem 120 MG 24 hr capsule Commonly known as:  CARDIZEM CD Take 1 capsule (120 mg total) by  mouth daily. Start taking on:  07/29/2017   nitroGLYCERIN 0.4 MG SL tablet Commonly known as:  NITROSTAT Place 1 tablet (0.4 mg total) under the tongue every 5 (five) minutes as needed for chest pain.   NYQUIL SEVERE COLD/FLU 5-6.25-10-325 MG/15ML Liqd Generic drug:  Phenyleph-Doxylamine-DM-APAP Take 15 mLs by mouth at bedtime as needed (for sleep).   pantoprazole 40 MG tablet Commonly known as:  PROTONIX Take 1 tablet (40 mg total) by mouth daily.   predniSONE 20 MG tablet Commonly known as:  DELTASONE Take '40mg'$  for 2days then '20mg'$  for 2days then STOP Start taking on:  07/29/2017   simvastatin 20 MG tablet Commonly known as:  ZOCOR Take 0.5 tablets (10 mg total) by mouth daily at 6 PM. What changed:  how much to take      Allergies  Allergen Reactions  . Crestor [Rosuvastatin Calcium] Other (See Comments)    All statins: Myalgias    Follow-up Information    Crist Infante, MD Follow up.   Specialty:  Internal Medicine Contact information: [place] [address] [phone]            The results of significant diagnostics from this hospitalization (including imaging, microbiology, ancillary and laboratory) are listed below for reference.    Significant Diagnostic Studies: Dg Chest Port 1 View  Result Date: 07/24/2017 CLINICAL DATA:  Shortness of breath, cough, fever EXAM: PORTABLE CHEST 1 VIEW COMPARISON:  02/03/2016 FINDINGS: There is right upper lobe airspace disease most consistent with pneumonia. There is no pleural effusion or pneumothorax. The heart and mediastinal contours are unremarkable. The osseous structures are unremarkable. IMPRESSION: Right upper lobe pneumonia. Followup PA and lateral chest X-ray is recommended in 3-4 weeks following trial of antibiotic therapy to ensure resolution and exclude underlying malignancy. Electronically Signed   By: Kathreen Devoid   On: 07/24/2017 12:37    Microbiology: Recent Results (from the past 240  hour(s))  Blood culture (routine x 2)     Status: None (Preliminary result)   Collection Time: 07/24/17 12:55 PM  Result Value Ref Range Status   Specimen Description BLOOD LEFT ANTECUBITAL  Final   Special Requests   Final    BOTTLES DRAWN AEROBIC AND ANAEROBIC Blood Culture adequate volume   Culture   Final    NO GROWTH 4 DAYS Performed at [facility], [address], [place], [place] [address]    Report Status PENDING  Incomplete  Blood culture (routine x 2)     Status: None (Preliminary result)   Collection Time: 07/24/17  1:07 PM  Result Value Ref Range Status   Specimen Description BLOOD RIGHT ANTECUBITAL  Final   Special Requests   Final    BOTTLES DRAWN AEROBIC AND ANAEROBIC Blood Culture results may not be optimal due to an excessive volume of blood received in culture bottles   Culture   Final    NO GROWTH 4 DAYS Performed at [facility], [place] [address] [place],  [address]    Report Status PENDING  Incomplete  MRSA PCR Screening  Status: None   Collection Time: 07/27/17  1:40 AM  Result Value Ref Range Status   MRSA by PCR NEGATIVE NEGATIVE Final    Comment:        The GeneXpert MRSA Assay (FDA approved for NASAL specimens only), is one component of a comprehensive MRSA colonization surveillance program. It is not intended to diagnose MRSA infection nor to guide or monitor treatment for MRSA infections. Performed at [facility], [place] [address], [place], [place] [address]      Labs: Basic Metabolic Panel: Recent Labs  Lab 07/24/17 1228 07/25/17 0328 07/26/17 0317 07/27/17 0348 07/28/17 0536  NA 135 138 140 139 140  K 4.3 4.5 3.4* 4.0 4.1  CL 99* 103 103 108 104  CO2 '26 25 27 22 28  '$ GLUCOSE 143* 153* 111* 203* 183*  BUN '10 11 20 17 '$ 29*  CREATININE 1.31* 1.14 1.10 0.86 1.01  CALCIUM 9.0 8.1* 8.4* 8.5* 8.6*   Liver Function Tests: Recent Labs  Lab 07/24/17 1228  AST 20  ALT 33  ALKPHOS 77  BILITOT 1.5*  PROT 7.2   ALBUMIN 3.1*   No results for input(s): LIPASE, AMYLASE in the last 168 hours. No results for input(s): AMMONIA in the last 168 hours. CBC: Recent Labs  Lab 07/24/17 1228 07/25/17 0328 07/26/17 0317 07/28/17 0536  WBC 17.7* 11.4* 9.7 9.0  NEUTROABS 14.8* 9.8*  --   --   HGB 14.2 11.4* 11.6* 10.9*  HCT 42.4 35.0* 36.2* 33.1*  MCV 95.1 96.4 97.8 93.8  PLT 320 257 283 329   Cardiac Enzymes: Recent Labs  Lab 07/27/17 0348 07/27/17 0849 07/27/17 1512  TROPONINI <0.03 <0.03 <0.03   BNP: BNP (last 3 results) Recent Labs    07/24/17 1228  BNP 120.1*    ProBNP (last 3 results) No results for input(s): PROBNP in the last 8760 hours.  CBG: No results for input(s): GLUCAP in the last 168 hours.     Signed:  Domenic Polite MD.  [facility] 07/28/2017, 1:48 PM

## 2017-07-29 LAB — CULTURE, BLOOD (ROUTINE X 2)
CULTURE: NO GROWTH
Culture: NO GROWTH
Special Requests: ADEQUATE

## 2017-07-31 ENCOUNTER — Encounter (HOSPITAL_COMMUNITY): Payer: Self-pay | Admitting: Emergency Medicine

## 2017-07-31 ENCOUNTER — Emergency Department (HOSPITAL_COMMUNITY): Payer: Medicare Other

## 2017-07-31 ENCOUNTER — Other Ambulatory Visit: Payer: Self-pay

## 2017-07-31 ENCOUNTER — Observation Stay (HOSPITAL_COMMUNITY)
Admission: EM | Admit: 2017-07-31 | Discharge: 2017-08-02 | Disposition: A | Discharge status: Home / Self Care | Payer: Medicare Other | Attending: Internal Medicine | Admitting: Internal Medicine

## 2017-07-31 DIAGNOSIS — Z955 Presence of coronary angioplasty implant and graft: Secondary | ICD-10-CM | POA: Insufficient documentation

## 2017-07-31 DIAGNOSIS — Z87891 Personal history of nicotine dependence: Secondary | ICD-10-CM | POA: Insufficient documentation

## 2017-07-31 DIAGNOSIS — Z79899 Other long term (current) drug therapy: Secondary | ICD-10-CM | POA: Insufficient documentation

## 2017-07-31 DIAGNOSIS — R002 Palpitations: Secondary | ICD-10-CM | POA: Diagnosis not present

## 2017-07-31 DIAGNOSIS — I4891 Unspecified atrial fibrillation: Secondary | ICD-10-CM | POA: Diagnosis not present

## 2017-07-31 DIAGNOSIS — J449 Chronic obstructive pulmonary disease, unspecified: Secondary | ICD-10-CM | POA: Diagnosis present

## 2017-07-31 DIAGNOSIS — R079 Chest pain, unspecified: Secondary | ICD-10-CM | POA: Diagnosis present

## 2017-07-31 DIAGNOSIS — R0602 Shortness of breath: Secondary | ICD-10-CM | POA: Diagnosis not present

## 2017-07-31 DIAGNOSIS — I2510 Atherosclerotic heart disease of native coronary artery without angina pectoris: Secondary | ICD-10-CM | POA: Diagnosis present

## 2017-07-31 DIAGNOSIS — Z7901 Long term (current) use of anticoagulants: Secondary | ICD-10-CM | POA: Diagnosis not present

## 2017-07-31 DIAGNOSIS — I252 Old myocardial infarction: Secondary | ICD-10-CM | POA: Diagnosis not present

## 2017-07-31 LAB — CBC
HEMATOCRIT: 43.3 % (ref 39.0–52.0)
HEMOGLOBIN: 14.3 g/dL (ref 13.0–17.0)
MCH: 30.6 pg (ref 26.0–34.0)
MCHC: 33 g/dL (ref 30.0–36.0)
MCV: 92.7 fL (ref 78.0–100.0)
Platelets: 546 10*3/uL — ABNORMAL HIGH (ref 150–400)
RBC: 4.67 MIL/uL (ref 4.22–5.81)
RDW: 12 % (ref 11.5–15.5)
WBC: 12.9 10*3/uL — ABNORMAL HIGH (ref 4.0–10.5)

## 2017-07-31 LAB — BASIC METABOLIC PANEL
Anion gap: 9 (ref 5–15)
BUN: 14 mg/dL (ref 6–20)
CHLORIDE: 103 mmol/L (ref 101–111)
CO2: 26 mmol/L (ref 22–32)
Calcium: 8.3 mg/dL — ABNORMAL LOW (ref 8.9–10.3)
Creatinine, Ser: 1.05 mg/dL (ref 0.61–1.24)
GFR calc non Af Amer: >60 mL/min (ref >60)
Glucose, Bld: 194 mg/dL — ABNORMAL HIGH (ref 65–99)
POTASSIUM: 3.7 mmol/L (ref 3.5–5.1)
Sodium: 138 mmol/L (ref 135–145)

## 2017-07-31 LAB — I-STAT CG4 LACTIC ACID, ED: LACTIC ACID, VENOUS: 2.6 mmol/L — AB (ref 0.5–1.9)

## 2017-07-31 LAB — I-STAT CHEM 8, ED
BUN: 19 mg/dL (ref 6–20)
CHLORIDE: 100 mmol/L — AB (ref 101–111)
Calcium, Ion: 1.08 mmol/L — ABNORMAL LOW (ref 1.15–1.40)
Creatinine, Ser: 0.9 mg/dL (ref 0.61–1.24)
Glucose, Bld: 192 mg/dL — ABNORMAL HIGH (ref 65–99)
HEMATOCRIT: 44 % (ref 39.0–52.0)
HEMOGLOBIN: 15 g/dL (ref 13.0–17.0)
POTASSIUM: 4.1 mmol/L (ref 3.5–5.1)
Sodium: 137 mmol/L (ref 135–145)
TCO2: 27 mmol/L (ref 22–32)

## 2017-07-31 LAB — I-STAT TROPONIN, ED: Troponin i, poc: 0.01 ng/mL (ref 0.00–0.08)

## 2017-07-31 MED ORDER — SODIUM CHLORIDE 0.9 % IV BOLUS
500.0000 mL | Freq: Once | INTRAVENOUS | Status: AC
  Administered 2017-07-31: 500 mL via INTRAVENOUS

## 2017-07-31 MED ORDER — ONDANSETRON HCL 4 MG/2ML IJ SOLN
4.0000 mg | Freq: Once | INTRAMUSCULAR | Status: AC
  Administered 2017-07-31: 4 mg via INTRAVENOUS
  Filled 2017-07-31: qty 2

## 2017-07-31 MED ORDER — MORPHINE SULFATE (PF) 4 MG/ML IV SOLN
2.0000 mg | Freq: Once | INTRAVENOUS | Status: AC
  Administered 2017-07-31: 2 mg via INTRAVENOUS
  Filled 2017-07-31: qty 1

## 2017-07-31 MED ORDER — DILTIAZEM HCL-DEXTROSE 100-5 MG/100ML-% IV SOLN (PREMIX)
5.0000 mg/h | INTRAVENOUS | Status: DC
  Administered 2017-07-31: 5 mg/h via INTRAVENOUS
  Filled 2017-07-31: qty 100

## 2017-07-31 MED ORDER — DILTIAZEM LOAD VIA INFUSION
10.0000 mg | Freq: Once | INTRAVENOUS | Status: AC
  Administered 2017-07-31: 10 mg via INTRAVENOUS
  Filled 2017-07-31: qty 10

## 2017-07-31 MED ORDER — MORPHINE SULFATE (PF) 4 MG/ML IV SOLN
4.0000 mg | Freq: Once | INTRAVENOUS | Status: AC
  Administered 2017-07-31: 4 mg via INTRAVENOUS
  Filled 2017-07-31: qty 1

## 2017-07-31 NOTE — ED Notes (Signed)
Dr. Adela Lank notified of elevated CG-4

## 2017-07-31 NOTE — ED Notes (Signed)
Pt requested 2L of O2. Pt's O2 Sat at time was 92%.

## 2017-07-31 NOTE — ED Triage Notes (Signed)
Patient here with rapid heart rate, chest pain and shortness of breath.  Patient's wife states that it started around 2130, patient had just gone to bed.

## 2017-08-01 ENCOUNTER — Other Ambulatory Visit: Payer: Self-pay

## 2017-08-01 ENCOUNTER — Encounter (HOSPITAL_COMMUNITY): Payer: Self-pay | Admitting: Internal Medicine

## 2017-08-01 DIAGNOSIS — R079 Chest pain, unspecified: Secondary | ICD-10-CM | POA: Diagnosis not present

## 2017-08-01 DIAGNOSIS — I4891 Unspecified atrial fibrillation: Secondary | ICD-10-CM | POA: Diagnosis not present

## 2017-08-01 LAB — BASIC METABOLIC PANEL
ANION GAP: 8 (ref 5–15)
BUN: 16 mg/dL (ref 6–20)
CALCIUM: 7.7 mg/dL — AB (ref 8.9–10.3)
CO2: 28 mmol/L (ref 22–32)
Chloride: 103 mmol/L (ref 101–111)
Creatinine, Ser: 1.03 mg/dL (ref 0.61–1.24)
GFR calc Af Amer: >60 mL/min (ref >60)
Glucose, Bld: 176 mg/dL — ABNORMAL HIGH (ref 65–99)
Potassium: 3.8 mmol/L (ref 3.5–5.1)
Sodium: 139 mmol/L (ref 135–145)

## 2017-08-01 LAB — CBC
HCT: 36.2 % — ABNORMAL LOW (ref 39.0–52.0)
HEMOGLOBIN: 12 g/dL — AB (ref 13.0–17.0)
MCH: 31.3 pg (ref 26.0–34.0)
MCHC: 33.1 g/dL (ref 30.0–36.0)
MCV: 94.3 fL (ref 78.0–100.0)
Platelets: 417 10*3/uL — ABNORMAL HIGH (ref 150–400)
RBC: 3.84 MIL/uL — ABNORMAL LOW (ref 4.22–5.81)
RDW: 12.2 % (ref 11.5–15.5)
WBC: 10.7 10*3/uL — ABNORMAL HIGH (ref 4.0–10.5)

## 2017-08-01 LAB — HEPATIC FUNCTION PANEL
ALK PHOS: 47 U/L (ref 38–126)
ALT: 79 U/L — AB (ref 17–63)
AST: 20 U/L (ref 15–41)
Albumin: 2 g/dL — ABNORMAL LOW (ref 3.5–5.0)
BILIRUBIN DIRECT: 0.1 mg/dL (ref 0.1–0.5)
BILIRUBIN INDIRECT: 0.4 mg/dL (ref 0.3–0.9)
BILIRUBIN TOTAL: 0.5 mg/dL (ref 0.3–1.2)
Total Protein: 4.6 g/dL — ABNORMAL LOW (ref 6.5–8.1)

## 2017-08-01 LAB — LACTIC ACID, PLASMA
Lactic Acid, Venous: 1.6 mmol/L (ref 0.5–1.9)
Lactic Acid, Venous: 1.2 mmol/L (ref 0.5–1.9)

## 2017-08-01 LAB — TSH: TSH: 2.185 u[IU]/mL (ref 0.350–4.500)

## 2017-08-01 LAB — I-STAT CG4 LACTIC ACID, ED: LACTIC ACID, VENOUS: 2.06 mmol/L — AB (ref 0.5–1.9)

## 2017-08-01 LAB — TROPONIN I
TROPONIN I: 0.03 ng/mL — AB (ref <0.03)
Troponin I: 0.04 ng/mL (ref <0.03)
Troponin I: 0.04 ng/mL (ref <0.03)

## 2017-08-01 LAB — MAGNESIUM
Magnesium: 2 mg/dL (ref 1.7–2.4)
Magnesium: 1.9 mg/dL (ref 1.7–2.4)

## 2017-08-01 MED ORDER — AMIODARONE HCL IN DEXTROSE 360-4.14 MG/200ML-% IV SOLN
60.0000 mg/h | INTRAVENOUS | Status: DC
  Administered 2017-08-01: 60 mg/h via INTRAVENOUS
  Filled 2017-08-01: qty 200

## 2017-08-01 MED ORDER — NITROGLYCERIN 0.4 MG SL SUBL: 0.4000 mg | SUBLINGUAL_TABLET | SUBLINGUAL | Status: DC | PRN

## 2017-08-01 MED ORDER — AMIODARONE HCL IN DEXTROSE 360-4.14 MG/200ML-% IV SOLN
30.0000 mg/h | INTRAVENOUS | Status: DC
  Administered 2017-08-01 (×2): 30 mg/h via INTRAVENOUS
  Filled 2017-08-01 (×2): qty 200

## 2017-08-01 MED ORDER — ALPRAZOLAM 0.25 MG PO TABS
0.5000 mg | ORAL_TABLET | Freq: Three times a day (TID) | ORAL | Status: DC | PRN
  Administered 2017-08-01: 1 mg via ORAL
  Filled 2017-08-01: qty 4

## 2017-08-01 MED ORDER — ACETAMINOPHEN 650 MG RE SUPP: 650.0000 mg | Freq: Four times a day (QID) | RECTAL | Status: DC | PRN

## 2017-08-01 MED ORDER — ONDANSETRON HCL 4 MG/2ML IJ SOLN: 4.0000 mg | Freq: Four times a day (QID) | INTRAMUSCULAR | Status: DC | PRN

## 2017-08-01 MED ORDER — MAGNESIUM OXIDE 400 (241.3 MG) MG PO TABS
400.0000 mg | ORAL_TABLET | Freq: Once | ORAL | Status: AC
  Administered 2017-08-01: 400 mg via ORAL
  Filled 2017-08-01: qty 1

## 2017-08-01 MED ORDER — AMIODARONE IV BOLUS ONLY 150 MG/100ML
150.0000 mg | Freq: Once | INTRAVENOUS | Status: AC
  Administered 2017-08-01: 150 mg via INTRAVENOUS
  Filled 2017-08-01: qty 100

## 2017-08-01 MED ORDER — ONDANSETRON HCL 4 MG PO TABS: 4.0000 mg | ORAL_TABLET | Freq: Four times a day (QID) | ORAL | Status: DC | PRN

## 2017-08-01 MED ORDER — POTASSIUM CHLORIDE CRYS ER 20 MEQ PO TBCR
20.0000 meq | EXTENDED_RELEASE_TABLET | Freq: Once | ORAL | Status: AC
  Administered 2017-08-01: 20 meq via ORAL
  Filled 2017-08-01: qty 1

## 2017-08-01 MED ORDER — DILTIAZEM HCL ER COATED BEADS 240 MG PO CP24
240.0000 mg | ORAL_CAPSULE | Freq: Every day | ORAL | Status: DC
  Administered 2017-08-02: 240 mg via ORAL
  Filled 2017-08-01: qty 1

## 2017-08-01 MED ORDER — ALBUTEROL SULFATE (2.5 MG/3ML) 0.083% IN NEBU: 3.0000 mL | INHALATION_SOLUTION | Freq: Four times a day (QID) | RESPIRATORY_TRACT | Status: DC | PRN

## 2017-08-01 MED ORDER — ACETAMINOPHEN 325 MG PO TABS: 650.0000 mg | ORAL_TABLET | Freq: Four times a day (QID) | ORAL | Status: DC | PRN

## 2017-08-01 MED ORDER — SIMVASTATIN 10 MG PO TABS
10.0000 mg | ORAL_TABLET | Freq: Every day | ORAL | Status: DC
  Administered 2017-08-01: 10 mg via ORAL
  Filled 2017-08-01 (×2): qty 1

## 2017-08-01 MED ORDER — MOMETASONE FURO-FORMOTEROL FUM 200-5 MCG/ACT IN AERO
2.0000 | INHALATION_SPRAY | Freq: Two times a day (BID) | RESPIRATORY_TRACT | Status: DC
  Administered 2017-08-01 – 2017-08-02 (×2): 2 via RESPIRATORY_TRACT
  Filled 2017-08-01: qty 8.8

## 2017-08-01 MED ORDER — AMOXICILLIN-POT CLAVULANATE 875-125 MG PO TABS
1.0000 | ORAL_TABLET | Freq: Two times a day (BID) | ORAL | Status: AC
  Administered 2017-08-01: 1 via ORAL
  Filled 2017-08-01: qty 1

## 2017-08-01 MED ORDER — APIXABAN 5 MG PO TABS
5.0000 mg | ORAL_TABLET | Freq: Two times a day (BID) | ORAL | Status: DC
  Administered 2017-08-01 – 2017-08-02 (×3): 5 mg via ORAL
  Filled 2017-08-01 (×3): qty 1

## 2017-08-01 MED ORDER — PANTOPRAZOLE SODIUM 40 MG PO TBEC
40.0000 mg | DELAYED_RELEASE_TABLET | Freq: Every day | ORAL | Status: DC
  Administered 2017-08-01 – 2017-08-02 (×2): 40 mg via ORAL
  Filled 2017-08-01 (×2): qty 1

## 2017-08-01 MED ORDER — DILTIAZEM HCL ER COATED BEADS 120 MG PO CP24
120.0000 mg | ORAL_CAPSULE | Freq: Every day | ORAL | Status: DC
  Administered 2017-08-01: 120 mg via ORAL
  Filled 2017-08-01: qty 1

## 2017-08-01 NOTE — Plan of Care (Signed)
  Problem: Education: Goal: Knowledge of General Education information will improve Outcome: Completed/Met

## 2017-08-01 NOTE — Consult Note (Addendum)
Cardiology Consultation:   Patient ID: Troy Jackson; [id number]; 10-02-1945   Admit date: 07/31/2017 Date of Consult: 08/01/2017  Primary Care Provider: Rodrigo Ran, MD Primary Cardiologist: Dr Swaziland Primary Electrophysiologist:  n/a   Patient Profile:   Troy Jackson is a 72 y.o. male with a hx of HLD; diverticulitis; CAD w/ stent CFX 2017; and COPD, remote tobacco dependence, bradycardia with BB, intolerant to Brilinta due to dyspnea who is being seen today for the evaluation of rapid atrial fib at the request of Dr Toniann Fail.  History of Present Illness:   Troy Jackson was admitted 05/13-05/17 for sepsis 2nd PNA, acute resp failure, COPD exacerbation, dx Afib RVR, EF nl on echo, ST depression seen when HR >150. Spontaneously converted to SR, in SR at d/c.    Troy Jackson was doing well after he went home.  He was walking around some, eating and drinking normally.  He was not having any palpitations or chest pain.  Last p.m., he had 3 beers with dinner.  He did not eat any unusual foods.  He did not have any unusual amount of caffeine.  He is not taking any over-the-counter cold medications.  He is compliant with all of his prescription drugs.  He went to bed and at about 9:30 PM last night, felt himself go into rapid atrial fibrillation.  He has a home pulse ox, and noted his heart rate to be as high as 173.  He felt bad when his heart rate was very high.  He was having central chest pain that was a tightness and reminded him of his pre-CFX stent symptoms.  He was also short of breath, but had no nausea, vomiting, or diaphoresis.  He did not try any medications for the chest pain.  He was frustrated because he did not have anything to take for the rapid heart rate.  They ended up coming into the emergency room.  In the emergency room, he was put on diltiazem IV, but it did not slow him down much or converted.  He was started on IV amiodarone.  He spontaneously converted to sinus  rhythm and is maintaining sinus rhythm now.    His chest pain resolved as soon as he went back into sinus rhythm.  He is frustrated because he did not expect to go back into atrial fibrillation.  He does not know of any reason that he should have.  He does not have any symptoms of acute illness, and his chest x-ray is improved from previous.   Past Medical History:  Diagnosis Date  . Anxiety   . COPD (chronic obstructive pulmonary disease) (HCC)   . Coronary artery disease 2017   stent  . Diverticulitis   . GERD (gastroesophageal reflux disease)   . Hyperlipidemia   . Tobacco abuse 2017    Past Surgical History:  Procedure Laterality Date  . CARDIAC CATHETERIZATION N/A 02/03/2016   Procedure: Left Heart Cath and Coronary Angiography;  Surgeon: Peter M Swaziland, MD;  Location[facility] INVASIVE CV LAB;  Service: Cardiovascular;  Laterality: N/A;  . CARDIAC CATHETERIZATION N/A 02/03/2016   Procedure: Coronary Stent Intervention;  Surgeon: Peter M Swaziland, MD;  Location: [facility] INVASIVE CV LAB;  Service: Cardiovascular;  Laterality: N/A;  . CORONARY ANGIOPLASTY WITH STENT PLACEMENT  02/03/2016  . SHOULDER SURGERY Right    as child  . SHOULDER SURGERY Left    as child     Prior to Admission medications   Medication Sig Start Date End  Date Taking? Authorizing Provider  albuterol (PROVENTIL HFA;VENTOLIN HFA) 108 (90 Base) MCG/ACT inhaler Inhale 1-2 puffs into the lungs every 6 (six) hours as needed for wheezing or shortness of breath.   Yes [provider]  ALPRAZolam Prudy Feeler) 0.5 MG tablet Take 0.5-1 mg by mouth 3 (three) times daily as needed for anxiety. For anxiety 01/13/11  Yes [provider]  amoxicillin-clavulanate (AUGMENTIN) 875-125 MG tablet Take 1 tablet by mouth every 12 (twelve) hours. For 3days 07/28/17  Yes Zannie Cove, MD  apixaban (ELIQUIS) 5 MG TABS tablet Take 1 tablet (5 mg total) by mouth 2 (two) times daily. 07/28/17  Yes Zannie Cove, MD    budesonide-formoterol [facility]) 160-4.5 MCG/ACT inhaler Inhale 2 puffs into the lungs 2 (two) times daily.   Yes [provider]  diltiazem (CARDIZEM CD) 120 MG 24 hr capsule Take 1 capsule (120 mg total) by mouth daily. 07/29/17  Yes Zannie Cove, MD  Liniments (BLUE-EMU SUPER STRENGTH) CREA Apply 1 application topically as needed (back pain).   Yes [provider]  nitroGLYCERIN (NITROSTAT) 0.4 MG SL tablet Place 1 tablet (0.4 mg total) under the tongue every 5 (five) minutes as needed for chest pain. 01/04/17  Yes Swaziland, Peter M, MD  pantoprazole (PROTONIX) 40 MG tablet Take 1 tablet (40 mg total) by mouth daily. 02/05/16  Yes Regalado, Belkys A, MD  Phenyleph-Doxylamine-DM-APAP (NYQUIL SEVERE COLD/FLU) 5-6.25-10-325 MG/15ML LIQD Take 15 mLs by mouth at bedtime as needed (for sleep).   Yes [provider]  simvastatin (ZOCOR) 20 MG tablet Take 0.5 tablets (10 mg total) by mouth daily at 6 PM. 07/28/17  Yes Zannie Cove, MD  predniSONE (DELTASONE) 20 MG tablet Take 40mg  for 2days then 20mg  for 2days then STOP Patient not taking: Reported on 08/01/2017 07/29/17   Zannie Cove, MD    Inpatient Medications: Scheduled Meds: . apixaban  5 mg Oral BID  . diltiazem  120 mg Oral Daily  . mometasone-formoterol  2 puff Inhalation BID  . pantoprazole  40 mg Oral Daily  . simvastatin  10 mg Oral q1800   Continuous Infusions: . amiodarone 30 mg/hr (08/01/17 1424)   PRN Meds: acetaminophen **OR** acetaminophen, albuterol, ALPRAZolam, nitroGLYCERIN, ondansetron **OR** ondansetron (ZOFRAN) IV  Allergies:    Allergies  Allergen Reactions  . Crestor [Rosuvastatin Calcium] Other (See Comments)    All statins: Myalgias     Social History:   Social History   Socioeconomic History  . Marital status: Married    Spouse name: Not on file  . Number of children: 2  . Years of education: Not on file  . Highest education level: Not on file  Occupational History  .  Occupation: retired Sales promotion account executive; works as a Lobbyist  . Financial resource strain: Not on file  . Food insecurity:    Worry: Not on file    Inability: Not on file  . Transportation needs:    Medical: Not on file    Non-medical: Not on file  Tobacco Use  . Smoking status: Former Smoker    Packs/day: 1.00    Years: 60.00    Pack years: 60.00    Types: Cigarettes    Last attempt to quit: 2017    Years since quitting: 2.3  . Smokeless tobacco: Never Used  Substance and Sexual Activity  . Alcohol use: Yes    Alcohol/week: 12.0 oz    Types: 20 Cans of beer per week    Comment: cannot remember when his  last day was without a drink  . Drug use: No  . Sexual activity: Not on file  Lifestyle  . Physical activity:    Days per week: Not on file    Minutes per session: Not on file  . Stress: Not on file  Relationships  . Social connections:    Talks on phone: Not on file    Gets together: Not on file    Attends religious service: Not on file    Active member of club or organization: Not on file    Attends meetings of clubs or organizations: Not on file    Relationship status: Not on file  . Intimate partner violence:    Fear of current or ex partner: Not on file    Emotionally abused: Not on file    Physically abused: Not on file    Forced sexual activity: Not on file  Other Topics Concern  . Not on file  Social History Narrative  . Not on file    Family History:   Family History  Problem Relation Age of Onset  . Heart disease Father   . Lung cancer Father   . Heart attack Father   . Alcohol abuse Father   . Alzheimer's disease Mother   . Heart attack Paternal Grandfather   . Alcohol abuse Paternal Grandfather   . Alcohol abuse Brother    Family Status:  Family Status  Relation Name Status  . Father  Deceased at age 78       heart attack  . Mother  Deceased at age 31       Alzheimer's disease  . Brother  Deceased at age 38       Alcohol abuse  .  Brother  Alive  . PGF  (Not Specified)  . Brother  (Not Specified)    ROS:  Please see the history of present illness.  All other ROS reviewed and negative.     Physical Exam/Data:   Vitals:   08/01/17 1030 08/01/17 1200 08/01/17 1342 08/01/17 1400  BP: 121/69 137/76 138/70 134/64  Pulse: 69 68 85 80  Resp: Temp:      SpO2: 97% 97% 96% 98%    Intake/Output Summary (Last 24 hours) at 08/01/2017 1505 Last data filed at 08/01/2017 0747 Gross per 24 hour  Intake 500 ml  Output 300 ml  Net 200 ml    General:  Well nourished, well developed, in no acute distress HEENT: normal Lymph: no adenopathy Neck: no JVD Endocrine:  No thryomegaly Vascular: No carotid bruits; 4/4 extremity pulses 2+, without bruits  Cardiac:  normal S1, S2; RRR; no murmur  Lungs:  clear to auscultation bilaterally, no wheezing, rhonchi or rales  Abd: soft, nontender, no hepatomegaly  Ext: no edema Musculoskeletal:  No deformities, BUE and BLE strength normal and equal Skin: warm and dry  Neuro:  CNs 2-12 intact, no focal abnormalities noted Psych:  Normal affect   EKG:  The EKG was personally reviewed and demonstrates:  05/20, atrial flutter, rapid ventricular response, heart rate 153.  Diffuse ST depression 5/21 ECG, sinus rhythm, heart rate 78, diffuse ST depression noted in previous ECG has resolved Telemetry:  Telemetry was personally reviewed and demonstrates:  Atrial fib>>SR  Relevant CV Studies:  ECHO: 07/27/2017 - Left ventricle: The cavity size was normal. Posterior wall   thickness was increased in a pattern of mild LVH. Systolic   function was normal. The estimated ejection fraction  was in the   range of 60% to 65%. Wall motion was normal; there were no   regional wall motion abnormalities. - Aortic valve: Transvalvular velocity was within the normal range.   There was no stenosis. There was no regurgitation. - Mitral valve: Transvalvular velocity was within the normal  range.   There was no evidence for stenosis. There was no regurgitation. - Right ventricle: The cavity size was normal. Wall thickness was   normal. Systolic function was normal. - Tricuspid valve: There was no regurgitation.  CATH: 02/03/2016  The left ventricular systolic function is normal.  LV end diastolic pressure is mildly elevated.  The left ventricular ejection fraction is 55-65% by visual estimate.  There is no mitral valve regurgitation.  Ost Cx to Prox Cx lesion, 35 %stenosed.  Mid Cx lesion, 30 %stenosed.  Prox RCA to Mid RCA lesion, 30 %stenosed.  Prox Cx to Mid Cx lesion, 90 %stenosed.  Post intervention, there is a 0% residual stenosis.  A STENT SYNERGY DES 3X16 drug eluting stent was successfully placed.  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 Normal LV function 4. Mildly elevated LVEDP 5. Successful stenting of the mid LCx with DES  Laboratory Data:  Chemistry Recent Labs  Lab 07/28/17 0536 07/31/17 2255 07/31/17 2317 08/01/17 0336  NA 140 138 137 139  K 4.1 3.7 4.1 3.8  CL 104 103 100* 103  CO2 28 26  --  28  GLUCOSE 183* 194* 192* 176*  BUN 29* CREATININE 1.01 1.05 0.90 1.03  CALCIUM 8.6* 8.3*  --  7.7*  GFRNONAA >60 >60  --  >60  GFRAA >60 >60  --  >60  ANIONGAP 8 9  --  8    Lab Results  Component Value Date   ALT 79 (H) 08/01/2017   AST 20 08/01/2017   ALKPHOS 47 08/01/2017   BILITOT 0.5 08/01/2017   Hematology Recent Labs  Lab 07/28/17 0536 07/31/17 2255 07/31/17 2317 08/01/17 0336  WBC 9.0 12.9*  --  10.7*  RBC 3.53* 4.67  --  3.84*  HGB 10.9* 14.3 15.0 12.0*  HCT 33.1* 43.3 44.0 36.2*  MCV 93.8 92.7  --  94.3  MCH 30.9 30.6  --  31.3  MCHC 32.9 33.0  --  33.1  RDW 12.3 12.0  --  12.2  PLT 329 546*  --  417*   Cardiac Enzymes Recent Labs  Lab 07/27/17 0348 07/27/17 0849 07/27/17 1512  08/01/17 0336 08/01/17 0845 08/01/17 1235  TROPONINI <0.03 <0.03 <0.03 0.03* 0.04* 0.04*    Recent Labs  Lab 07/31/17 2315  TROPIPOC 0.01    BNPNo results for input(s): BNP, PROBNP in the last 168 hours.  DDimer No results for input(s): DDIMER in the last 168 hours. TSH:  Lab Results  Component Value Date   TSH 2.185 07/31/2017   Lipids: Lab Results  Component Value Date   CHOL 195 02/03/2016   HDL 41 02/03/2016   LDLCALC 134 (H) 02/03/2016   TRIG 100 02/03/2016   CHOLHDL 4.8 02/03/2016   HgbA1c:No results found for: HGBA1C Magnesium:  Magnesium  Date Value Ref Range Status  08/01/2017 1.9 1.7 - 2.4 mg/dL Final    Comment:    Performed at [facility], [address], [place], [place] [address]     Radiology/Studies:  Dg Chest Portable 1  View  Result Date: 07/31/2017 CLINICAL DATA:  Rapid heart rate and chest pain EXAM: PORTABLE CHEST 1 VIEW COMPARISON:  07/24/2017, 02/03/2016 FINDINGS: Mixed interstitial and ground-glass opacity in the right upper lobe, slightly improved. Hyperinflation. No new opacity. Normal heart size. No pneumothorax. IMPRESSION: Partial but incomplete clearing of mixed interstitial and ground-glass opacity in the right upper lobe, likely pneumonia. Continued imaging follow-up to resolution is recommended. Electronically Signed   By: Jasmine Pang M.D.   On: 07/31/2017 23:49    Assessment and Plan:   Principal Problem: 1.  Atrial fibrillation with RVR (HCC) -He spontaneously converted to sinus rhythm on amiodarone -Now that he is in sinus rhythm, discuss with MD changing the amiodarone to p.o. -TSH was normal during his recent hospitalization -No lifestyle issues or over-the-counter/illegal drugs -He did have 3 beers that night, unusual for him. -He is compliant with Eliquis.  Active Problems: 2.  Chest pain -Enzymes previously only minimally elevated, peak 0.08 in the setting of sepsis and rapid atrial fibrillation -Current enzymes  are negative. -Most likely demand ischemia in the setting of rapid ventricular response -MD advise if any further work-up is needed  Otherwise, per IM   COPD (chronic obstructive pulmonary disease) (HCC)   CAD (coronary artery disease)   For questions or updates, please contact [facility] Please consult [email or web address] for contact info under Cardiology/STEMI.   Signed, Theodore Demark, PA-C  08/01/2017 3:05 PM  History and all data above reviewed.  Patient examined.  I agree with the findings as above.  The patient presents with recurrent atrial fib with rapid rate a few days after discharge for treatment of pneumonia and atrial fib.  Converted on NSR.  This is the third episode all occurring with this pneumonia or while home recovering.  (He also drank three beers which is not abnormal for him.)  He otherwise has had no recent angina and never had symptoms similar to this before.  He says he felt the fib all three times it happened (twice in the hospital).  The patient exam reveals COR:RRR  ,  Lungs: Clear  ,  Abd: Positive bowel sounds, no rebound no guarding, Ext Mild leg edema, DP and PT mildly decreased.   .  All available labs, radiology testing, previous records reviewed. Agree with documented assessment and plan. Atrial fib:  I had a long discussion with the patient and his daughter who is a Fish farm manager.  They understand rhythm vs rate control.  At this point I would like not to commit him to amiodarone long term (His daughter does not want him to be on this med.) or to Tikosyn or Sotalol.  Rather, I will stop the amiodarone and increase the Cardizem.  If he goes home tomorrow I would include "pill in pocket" beta blocker.  If he has a third recurrence further removed from the pneumonia then I think he would need an anti arrhythmic.    Troy Jackson  5:40 PM  08/01/2017

## 2017-08-01 NOTE — ED Notes (Signed)
Pt sitting on side of bed, cardiology at beside talking with patient and his wife.

## 2017-08-01 NOTE — H&P (Signed)
History and Physical    Troy Jackson EAV:[id number] DOB: 30-Jul-1945 DOA: 07/31/2017  PCP: Rodrigo Ran, MD  Patient coming from: Home.  Chief Complaint: Chest pain.  HPI: Troy Jackson is a 72 y.o. male with history of CAD status post stenting in 2017 who was recently admitted for pneumonia and COPD exacerbation when patient was found to be in A. fib with RVR started on Cardizem and apixaban started experiencing chest pressure last evening after dinner.  Pressure was retrosternal nonradiating with no associated shortness of breath productive cough fever or chills.  Since pain persisted patient came to the ER.  ED Course: In the ER patient was found to be in A. fib with RVR and cardiology on-call was consulted.  Cardiologist advised patient to be started on amiodarone.  Shortly after coming in the ER patient's chest pain is improved and presently chest pain-free.  Denies any nausea vomiting abdominal pain diarrhea.  Has been taking his prednisone has 1 more dose left.  Has 1 more dose of Augmentin left for his pneumonia.   Review of Systems: As per HPI, rest all negative.   Past Medical History:  Diagnosis Date  . Anxiety   . COPD (chronic obstructive pulmonary disease) (HCC)   . Coronary artery disease 2017   stent  . Diverticulitis   . GERD (gastroesophageal reflux disease)   . Hyperlipidemia   . Tobacco abuse 2017    Past Surgical History:  Procedure Laterality Date  . CARDIAC CATHETERIZATION N/A 02/03/2016   Procedure: Left Heart Cath and Coronary Angiography;  Surgeon: Peter M Swaziland, MD;  Location: [facility] INVASIVE CV LAB;  Service: Cardiovascular;  Laterality: N/A;  . CARDIAC CATHETERIZATION N/A 02/03/2016   Procedure: Coronary Stent Intervention;  Surgeon: Peter M Swaziland, MD;  Location: [facility] INVASIVE CV LAB;  Service: Cardiovascular;  Laterality: N/A;  . CORONARY ANGIOPLASTY WITH STENT PLACEMENT  02/03/2016  . SHOULDER SURGERY Right    as child  . SHOULDER SURGERY Left      as child     reports that he quit smoking about 2 years ago. His smoking use included cigarettes. He has a 60.00 pack-year smoking history. He has never used smokeless tobacco. He reports that he drinks about 12.0 oz of alcohol per week. He reports that he does not use drugs.  Allergies  Allergen Reactions  . Crestor [Rosuvastatin Calcium] Other (See Comments)    All statins: Myalgias     Family History  Problem Relation Age of Onset  . Heart disease Father   . Lung cancer Father   . Heart attack Father   . Alcohol abuse Father   . Alzheimer's disease Mother   . Heart attack Paternal Grandfather   . Alcohol abuse Paternal Grandfather   . Alcohol abuse Brother     Prior to Admission medications   Medication Sig Start Date End Date Taking? Authorizing Provider  albuterol (PROVENTIL HFA;VENTOLIN HFA) 108 (90 Base) MCG/ACT inhaler Inhale 1-2 puffs into the lungs every 6 (six) hours as needed for wheezing or shortness of breath.   Yes [provider]  ALPRAZolam Prudy Feeler) 0.5 MG tablet Take 0.5-1 mg by mouth 3 (three) times daily as needed for anxiety. For anxiety 01/13/11  Yes [provider]  amoxicillin-clavulanate (AUGMENTIN) 875-125 MG tablet Take 1 tablet by mouth every 12 (twelve) hours. For 3days 07/28/17  Yes Zannie Cove, MD  apixaban (ELIQUIS) 5 MG TABS tablet Take 1 tablet (5 mg total) by mouth 2 (two) times  daily. 07/28/17  Yes Zannie Cove, MD  budesonide-formoterol [facility]) 160-4.5 MCG/ACT inhaler Inhale 2 puffs into the lungs 2 (two) times daily.   Yes [provider]  diltiazem (CARDIZEM CD) 120 MG 24 hr capsule Take 1 capsule (120 mg total) by mouth daily. 07/29/17  Yes Zannie Cove, MD  Liniments (BLUE-EMU SUPER STRENGTH) CREA Apply 1 application topically as needed (back pain).   Yes [provider]  nitroGLYCERIN (NITROSTAT) 0.4 MG SL tablet Place 1 tablet (0.4 mg total) under the tongue every 5 (five) minutes as needed for  chest pain. 01/04/17  Yes Swaziland, Peter M, MD  pantoprazole (PROTONIX) 40 MG tablet Take 1 tablet (40 mg total) by mouth daily. 02/05/16  Yes Regalado, Belkys A, MD  Phenyleph-Doxylamine-DM-APAP (NYQUIL SEVERE COLD/FLU) 5-6.25-10-325 MG/15ML LIQD Take 15 mLs by mouth at bedtime as needed (for sleep).   Yes [provider]  simvastatin (ZOCOR) 20 MG tablet Take 0.5 tablets (10 mg total) by mouth daily at 6 PM. 07/28/17  Yes Zannie Cove, MD  predniSONE (DELTASONE) 20 MG tablet Take  for 2days then  for 2days then STOP Patient not taking: Reported on 08/01/2017 07/29/17   Zannie Cove, MD    Physical Exam: Vitals:   08/01/17 0000 08/01/17 0030 08/01/17 0100 08/01/17 0130  BP: 106/83 113/76 104/72 124/70  Pulse: (!) 160 (!) 148  82  Resp: Temp:      SpO2: 94% 92% 93% 98%      Constitutional: Moderately built and nourished. Vitals:   08/01/17 0000 08/01/17 0030 08/01/17 0100 08/01/17 0130  BP: 106/83 113/76 104/72 124/70  Pulse: (!) 160 (!) 148  82  Resp: Temp:      SpO2: 94% 92% 93% 98%   Eyes: Anicteric no pallor. ENMT: No discharge from the ears eyes nose or mouth. Neck: No mass palpated no JVD appreciated. Respiratory: No rhonchi or crepitations. Cardiovascular: S1-S2 heard tachycardic. Abdomen: Soft nontender bowel sounds present.  No guarding or rigidity. Musculoskeletal: No edema.  No joint effusion. Skin: No rash.  Skin appears warm. Neurologic: Alert awake oriented to time place and person.  Moves all extremity's. Psychiatric: Appears normal.  Normal affect.   Labs on Admission: I have personally reviewed following labs and imaging studies  CBC: Recent Labs  Lab 07/25/17 0328 07/26/17 0317 07/28/17 0536 07/31/17 2255 07/31/17 2317  WBC 11.4* 9.7 9.0 12.9*  --   NEUTROABS 9.8*  --   --   --   --   HGB 11.4* 11.6* 10.9* 14.3 15.0  HCT 35.0* 36.2* 33.1* 43.3 44.0  MCV 96.4 97.8 93.8 92.7  --   PLT 257 283 329 546*   --    Basic Metabolic Panel: Recent Labs  Lab 07/25/17 0328 07/26/17 0317 07/27/17 0348 07/28/17 0536 07/31/17 2255 07/31/17 2317  NA 138 140 139 140 138 137  K 4.5 3.4* 4.0 4.1 3.7 4.1  CL 103 103 108 104 103 100*  CO2 --   GLUCOSE 153* 111* 203* 183* 194* 192*  BUN 29* 14 19  CREATININE 1.14 1.10 0.86 1.01 1.05 0.90  CALCIUM 8.1* 8.4* 8.5* 8.6* 8.3*  --   MG  --   --   --   --  2.0  --    GFR: Estimated Creatinine Clearance: 92.1 mL/min (by C-G formula based on SCr of 0.9 mg/dL). Liver Function Tests: No results for input(s):  AST, ALT, ALKPHOS, BILITOT, PROT, ALBUMIN in the last 168 hours. No results for input(s): LIPASE, AMYLASE in the last 168 hours. No results for input(s): AMMONIA in the last 168 hours. Coagulation Profile: No results for input(s): INR, PROTIME in the last 168 hours. Cardiac Enzymes: Recent Labs  Lab 07/27/17 0348 07/27/17 0849 07/27/17 1512  TROPONINI <0.03 <0.03 <0.03   BNP (last 3 results) No results for input(s): PROBNP in the last 8760 hours. HbA1C: No results for input(s): HGBA1C in the last 72 hours. CBG: No results for input(s): GLUCAP in the last 168 hours. Lipid Profile: No results for input(s): CHOL, HDL, LDLCALC, TRIG, CHOLHDL, LDLDIRECT in the last 72 hours. Thyroid Function Tests: Recent Labs    07/31/17 2255  TSH 2.185   Anemia Panel: No results for input(s): VITAMINB12, FOLATE, FERRITIN, TIBC, IRON, RETICCTPCT in the last 72 hours. Urine analysis:    Component Value Date/Time   COLORURINE AMBER (A) 07/24/2017 1228   APPEARANCEUR CLEAR 07/24/2017 1228   LABSPEC 1.016 07/24/2017 1228   PHURINE 6.0 07/24/2017 1228   GLUCOSEU 50 (A) 07/24/2017 1228   HGBUR NEGATIVE 07/24/2017 1228   BILIRUBINUR NEGATIVE 07/24/2017 1228   KETONESUR NEGATIVE 07/24/2017 1228   PROTEINUR 30 (A) 07/24/2017 1228   NITRITE NEGATIVE 07/24/2017 1228   LEUKOCYTESUR NEGATIVE 07/24/2017 1228   Sepsis  Labs: (procalcitonin:4,lacticidven:4) ) Recent Results (from the past 240 hour(s))  Blood culture (routine x 2)     Status: None   Collection Time: 07/24/17 12:55 PM  Result Value Ref Range Status   Specimen Description BLOOD LEFT ANTECUBITAL  Final   Special Requests   Final    BOTTLES DRAWN AEROBIC AND ANAEROBIC Blood Culture adequate volume   Culture   Final    NO GROWTH 5 DAYS Performed at [facility], [address] [place], [place] [address]    Report Status 07/29/2017 FINAL  Final  Blood culture (routine x 2)     Status: None   Collection Time: 07/24/17  1:07 PM  Result Value Ref Range Status   Specimen Description BLOOD RIGHT ANTECUBITAL  Final   Special Requests   Final    BOTTLES DRAWN AEROBIC AND ANAEROBIC Blood Culture results may not be optimal due to an excessive volume of blood received in culture bottles   Culture   Final    NO GROWTH 5 DAYS Performed at [facility], [address], [place], [place] [address]    Report Status 07/29/2017 FINAL  Final  MRSA PCR Screening     Status: None   Collection Time: 07/27/17  1:40 AM  Result Value Ref Range Status   MRSA by PCR NEGATIVE NEGATIVE Final    Comment:        The GeneXpert MRSA Assay (FDA approved for NASAL specimens only), is one component of a comprehensive MRSA colonization surveillance program. It is not intended to diagnose MRSA infection nor to guide or monitor treatment for MRSA infections. Performed at [facility], [address] [place], [place] [address]      Radiological Exams on Admission: Dg Chest Portable 1 View  Result Date: 07/31/2017 CLINICAL DATA:  Rapid heart rate and chest pain EXAM: PORTABLE CHEST 1 VIEW COMPARISON:  07/24/2017, 02/03/2016 FINDINGS: Mixed interstitial and ground-glass opacity in the right upper lobe, slightly improved. Hyperinflation. No new opacity. Normal heart size. No pneumothorax. IMPRESSION: Partial but incomplete clearing of  mixed interstitial and ground-glass opacity in the right upper lobe, likely  pneumonia. Continued imaging follow-up to resolution is recommended. Electronically Signed   By: Jasmine Pang M.D.   On: 07/31/2017 23:49    EKG: Independently reviewed.  Atrial fibrillation with RVR.  Assessment/Plan Principal Problem:   Atrial fibrillation with RVR (HCC) Active Problems:   Chest pain   COPD (chronic obstructive pulmonary disease) (HCC)   CAD (coronary artery disease)    1. Atrial fibrillation with RVR -chads 2 vasc score of 1 patient was started on apixaban by cardiologist during recent admission.  Will continue apixaban patient is on amiodarone drip.  Patient is also on Cardizem which will be dosed in the morning.  Cardiology consult requested.  Check troponins TSH.  Has had recent 2D echo done 2 weeks ago which showed EF of 60 to 65%. 2. Chest pain with history of CAD status post stenting in 2017 -chest pain improved after coming to the ER.  Presently chest pain-free.  Likely precipitated by increased rate.  We will cycle cardiac markers.  Patient is on apixaban statins and Cardizem.  Intolerant to beta-blockers which makes bradycardic. 3. Hyperglycemia likely from recent use of steroids. 4. Recently admitted for pneumonia 1 more dose of Augmentin left. 5. COPD not actively wheezing. 6. Mild leukocytosis could be from recent pneumonia or recent steroid use.   DVT prophylaxis: Apixaban. Code Status: Full code. Family Communication: Patient's wife. Disposition Plan: Home. Consults called: Cardiology. Admission status: Observation.   Eduard Clos MD [facility] Pager [phone].  If 7PM-7AM, please contact night-coverage [email or web address] Password [email or web address]  08/01/2017, 1:53 AM

## 2017-08-01 NOTE — Progress Notes (Signed)
  Amiodarone Drug - Drug Interaction Consult Note  Recommendations:  Amiodarone is metabolized by the cytochrome P450 system and therefore has the potential to cause many drug interactions. Amiodarone has an average plasma half-life of 50 days (range 20 to 100 days).   There is potential for drug interactions to occur several weeks or months after stopping treatment and the onset of drug interactions may be slow after initiating amiodarone.    Statins: Increased risk of myopathy. Simvastatin- restrict dose to  daily. Other statins: counsel patients to report any muscle pain or weakness immediately.   Anticoagulants: Amiodarone can increase anticoagulant effect. Consider warfarin dose reduction. Patients should be monitored closely and the dose of anticoagulant altered accordingly, remembering that amiodarone levels take several weeks to stabilize.   Antiepileptics: Amiodarone can increase plasma concentration of phenytoin, the dose should be reduced. Note that small changes in phenytoin dose can result in large changes in levels. Monitor patient and counsel on signs of toxicity.   Beta blockers: increased risk of bradycardia, AV block and myocardial depression. Sotalol - avoid concomitant use.    Calcium channel blockers (diltiazem and verapamil): increased risk of bradycardia, AV block and myocardial depression.    Cyclosporine: Amiodarone increases levels of cyclosporine. Reduced dose of cyclosporine is recommended.   Digoxin dose should be halved when amiodarone is started.   Diuretics: increased risk of cardiotoxicity if hypokalemia occurs.   Oral hypoglycemic agents (glyburide, glipizide, glimepiride): increased risk of hypoglycemia. Patient's glucose levels should be monitored closely when initiating amiodarone therapy.    Drugs that prolong the QT interval:  Torsades de pointes risk may be increased with concurrent use - avoid if possible.  Monitor QTc, also  keep magnesium/potassium WNL if concurrent therapy can't be avoided. Marland Kitchen Antibiotics: e.g. fluoroquinolones, erythromycin. . Antiarrhythmics: e.g. quinidine, procainamide, disopyramide, sotalol. . Antipsychotics: e.g. phenothiazines, haloperidol.  . Lithium, tricyclic antidepressants, and methadone. Thank You,  Talbert Cage Poteet  08/01/2017 12:18 AM

## 2017-08-01 NOTE — ED Provider Notes (Signed)
[facility] EMERGENCY DEPARTMENT Provider Note   CSN: [id number] Arrival date & time: 07/31/17  2242     History   Chief Complaint Chief Complaint  Patient presents with  . Chest Pain  . Shortness of Breath    HPI Troy Jackson is a 72 y.o. male.  72 yo M with a chief complaint of sudden onset chest pain and palpitations.  This happened when he went to go to sleep about 9 PM.  Felt that his heart was racing every time he got up to walk around felt very short of breath and had a pressure around his chest.  Feels like his prior episode that he had in the hospital last week.  The patient was recently hospitalized for right upper lobe pneumonia.  Has been doing well at home for the past couple days.  Denies prior diagnosis of atrial fibrillation.  Is taking Eliquis as prescribed.  The history is provided by the patient.  Chest Pain   This is a recurrent problem. The current episode started 3 to 5 hours ago. The problem occurs constantly. The problem has not changed since onset.The pain is associated with rest. The pain is present in the substernal region. The pain is at a severity of 8/10. The pain is moderate. The quality of the pain is described as brief. The pain does not radiate. Duration of episode(s) is 3 hours. Pertinent negatives include no abdominal pain, no fever, no headaches, no palpitations, no shortness of breath and no vomiting. He has tried nothing for the symptoms. The treatment provided no relief.  His past medical history is significant for MI.  Shortness of Breath  Associated symptoms include chest pain. Pertinent negatives include no fever, no headaches, no vomiting, no abdominal pain and no rash. Associated medical issues include past MI.    Past Medical History:  Diagnosis Date  . Anxiety   . COPD (chronic obstructive pulmonary disease) (HCC)   . Coronary artery disease 2017   stent  . Diverticulitis   . GERD (gastroesophageal reflux  disease)   . Hyperlipidemia   . Tobacco abuse 2017    Patient Active Problem List   Diagnosis Date Noted  . Atrial fibrillation with RVR (HCC) 07/27/2017  . Sepsis due to pneumonia (HCC) 07/24/2017  . CAD (coronary artery disease) 07/24/2017  . NSTEMI (non-ST elevated myocardial infarction) (HCC) 02/11/2016  . Chest pain 02/03/2016  . COPD (chronic obstructive pulmonary disease) (HCC) 02/03/2016  . Unstable angina (HCC)   . Hyperlipidemia 01/31/2011  . Tobacco abuse 01/31/2011    Past Surgical History:  Procedure Laterality Date  . CARDIAC CATHETERIZATION N/A 02/03/2016   Procedure: Left Heart Cath and Coronary Angiography;  Surgeon: Peter M Swaziland, MD;  Location: [facility] INVASIVE CV LAB;  Service: Cardiovascular;  Laterality: N/A;  . CARDIAC CATHETERIZATION N/A 02/03/2016   Procedure: Coronary Stent Intervention;  Surgeon: Peter M Swaziland, MD;  Location: [facility] INVASIVE CV LAB;  Service: Cardiovascular;  Laterality: N/A;  . CORONARY ANGIOPLASTY WITH STENT PLACEMENT  02/03/2016  . SHOULDER SURGERY Right    as child  . SHOULDER SURGERY Left    as child        Home Medications    Prior to Admission medications   Medication Sig Start Date End Date Taking? Authorizing Provider  albuterol (PROVENTIL HFA;VENTOLIN HFA) 108 (90 Base) MCG/ACT inhaler Inhale 1-2 puffs into the lungs every 6 (six) hours as needed for wheezing or shortness of breath.   Yes [provider]  ALPRAZolam (XANAX) 0.5 MG tablet Take 0.5-1 mg by mouth 3 (three) times daily as needed for anxiety. For anxiety 01/13/11  Yes [provider]  amoxicillin-clavulanate (AUGMENTIN) 875-125 MG tablet Take 1 tablet by mouth every 12 (twelve) hours. For 3days 07/28/17  Yes Zannie Cove, MD  apixaban (ELIQUIS) 5 MG TABS tablet Take 1 tablet (5 mg total) by mouth 2 (two) times daily. 07/28/17  Yes Zannie Cove, MD  budesonide-formoterol [facility]) 160-4.5 MCG/ACT inhaler Inhale 2 puffs into the lungs 2 (two)  times daily.   Yes [provider]  diltiazem (CARDIZEM CD) 120 MG 24 hr capsule Take 1 capsule (120 mg total) by mouth daily. 07/29/17  Yes Zannie Cove, MD  Liniments (BLUE-EMU SUPER STRENGTH) CREA Apply 1 application topically as needed (back pain).   Yes [provider]  nitroGLYCERIN (NITROSTAT) 0.4 MG SL tablet Place 1 tablet (0.4 mg total) under the tongue every 5 (five) minutes as needed for chest pain. 01/04/17  Yes Swaziland, Peter M, MD  pantoprazole (PROTONIX) 40 MG tablet Take 1 tablet (40 mg total) by mouth daily. 02/05/16  Yes Regalado, Belkys A, MD  Phenyleph-Doxylamine-DM-APAP (NYQUIL SEVERE COLD/FLU) 5-6.25-10-325 MG/15ML LIQD Take 15 mLs by mouth at bedtime as needed (for sleep).   Yes [provider]  simvastatin (ZOCOR) 20 MG tablet Take 0.5 tablets (10 mg total) by mouth daily at 6 PM. 07/28/17  Yes Zannie Cove, MD  predniSONE (DELTASONE) 20 MG tablet Take  for 2days then  for 2days then STOP Patient not taking: Reported on 08/01/2017 07/29/17   Zannie Cove, MD    Family History Family History  Problem Relation Age of Onset  . Heart disease Father   . Lung cancer Father   . Heart attack Father   . Alcohol abuse Father   . Alzheimer's disease Mother   . Heart attack Paternal Grandfather   . Alcohol abuse Paternal Grandfather   . Alcohol abuse Brother     Social History Social History   Tobacco Use  . Smoking status: Former Smoker    Packs/day: 1.00    Years: 60.00    Pack years: 60.00    Types: Cigarettes    Last attempt to quit: 2017    Years since quitting: 2.3  . Smokeless tobacco: Never Used  Substance Use Topics  . Alcohol use: Yes    Alcohol/week: 12.0 oz    Types: 20 Cans of beer per week    Comment: cannot remember when his last day was without a drink  . Drug use: No     Allergies   Crestor [rosuvastatin calcium]   Review of Systems Review of Systems  Constitutional: Positive for fatigue. Negative  for chills and fever.  HENT: Negative for congestion and facial swelling.   Eyes: Negative for discharge and visual disturbance.  Respiratory: Negative for shortness of breath.   Cardiovascular: Positive for chest pain. Negative for palpitations.  Gastrointestinal: Negative for abdominal pain, diarrhea and vomiting.  Musculoskeletal: Negative for arthralgias and myalgias.  Skin: Negative for color change and rash.  Neurological: Negative for tremors, syncope and headaches.  Psychiatric/Behavioral: Negative for confusion and dysphoric mood.     Physical Exam Updated Vital Signs BP 113/76   Pulse (!) 148   Temp 98.2 F (36.8 C)   Resp 15   SpO2 92%   Physical Exam  Constitutional: He is oriented to person, place, and time. He appears well-developed and well-nourished.  HENT:  Head: Normocephalic and  atraumatic.  Eyes: Pupils are equal, round, and reactive to light. EOM are normal.  Neck: Normal range of motion. Neck supple. No JVD present.  Cardiovascular: Regular rhythm. Tachycardia present. Exam reveals no gallop and no friction rub.  No murmur heard. Pulmonary/Chest: No respiratory distress. He has no wheezes.  Abdominal: He exhibits no distension. There is no rebound and no guarding.  Musculoskeletal: Normal range of motion. He exhibits edema (trace bilaterally).  Neurological: He is alert and oriented to person, place, and time.  Skin: No rash noted. No pallor.  Psychiatric: He has a normal mood and affect. His behavior is normal.  Nursing note and vitals reviewed.    ED Treatments / Results  Labs (all labs ordered are listed, but only abnormal results are displayed) Labs Reviewed  BASIC METABOLIC PANEL - Abnormal; Notable for the following components:      Result Value   Glucose, Bld 194 (*)    Calcium 8.3 (*)    All other components within normal limits  CBC - Abnormal; Notable for the following components:   WBC 12.9 (*)    Platelets 546 (*)    All other  components within normal limits  I-STAT CHEM 8, ED - Abnormal; Notable for the following components:   Chloride 100 (*)    Glucose, Bld 192 (*)    Calcium, Ion 1.08 (*)    All other components within normal limits  I-STAT CG4 LACTIC ACID, ED - Abnormal; Notable for the following components:   Lactic Acid, Venous 2.60 (*)    All other components within normal limits  MAGNESIUM  TSH  MAGNESIUM  I-STAT TROPONIN, ED  I-STAT CG4 LACTIC ACID, ED    EKG EKG Interpretation  Date/Time:  Monday Jul 31 2017 22:59:21 EDT Ventricular Rate:  164 PR Interval:    QRS Duration: 92 QT Interval:  300 QTC Calculation: 496 R Axis:   69 Text Interpretation:  Supraventricular tachycardia Repolarization abnormality, prob rate related Baseline wander in lead(s) II III aVR aVF V3 No significant change since last tracing Confirmed by Melene Plan [id number]) on 07/31/2017 11:49:40 PM   Radiology Dg Chest Portable 1 View  Result Date: 07/31/2017 CLINICAL DATA:  Rapid heart rate and chest pain EXAM: PORTABLE CHEST 1 VIEW COMPARISON:  07/24/2017, 02/03/2016 FINDINGS: Mixed interstitial and ground-glass opacity in the right upper lobe, slightly improved. Hyperinflation. No new opacity. Normal heart size. No pneumothorax. IMPRESSION: Partial but incomplete clearing of mixed interstitial and ground-glass opacity in the right upper lobe, likely pneumonia. Continued imaging follow-up to resolution is recommended. Electronically Signed   By: Jasmine Pang M.D.   On: 07/31/2017 23:49    Procedures Procedures (including critical care time)  Medications Ordered in ED Medications  diltiazem (CARDIZEM) 1 mg/mL load via infusion 10 mg (10 mg Intravenous Bolus from Bag 07/31/17 2326)    And  diltiazem (CARDIZEM) 100 mg in dextrose 5% (1 mg/mL) infusion (0 mg/hr Intravenous Stopped 08/01/17 0019)  amiodarone (NEXTERONE PREMIX) 360-4.14 MG/200ML-% (1.8 mg/mL) IV infusion (60 mg/hr Intravenous New Bag/Given 08/01/17 0023)      Followed by  amiodarone (NEXTERONE PREMIX) 360-4.14 MG/200ML-% (1.8 mg/mL) IV infusion (has no administration in time range)  morphine 4 MG/ML injection 2 mg (2 mg Intravenous Given 07/31/17 2326)  ondansetron (ZOFRAN) injection 4 mg (4 mg Intravenous Given 07/31/17 2326)  sodium chloride 0.9 % bolus 500 mL (0 mLs Intravenous Stopped 07/31/17 2351)  morphine 4 MG/ML injection 4 mg (4 mg Intravenous Given 07/31/17 2351)  amiodarone (NEXTERONE) IV bolus only 150 mg/100 mL (150 mg Intravenous New Bag/Given 08/01/17 0021)     Initial Impression / Assessment and Plan / ED Course  I have reviewed the triage vital signs and the nursing notes.  Pertinent labs & imaging results that were available during my care of the patient were reviewed by me and considered in my medical decision making (see chart for details).     72 yo M with a chief complaint of chest pain and palpitations.  This started abruptly about 3 hours ago.  Patient with a atrial flutter versus A. fib with RVR.  Started on diltiazem with episodic improvement but persistently having a heart rate in the 150s to 160s.  I discussed the case with cardiology recommended stopping the diltiazem and transitioning him over to amiodarone.  The patient is currently on Eliquis.  He had his first episode in the hospital recently where he had right upper lobe pneumonia.  He feels that he is better from that standpoint.  He was well for the past couple days.  Admit.  CRITICAL CARE Performed by: Rae Roam   Total critical care time: 80 minutes  Critical care time was exclusive of separately billable procedures and treating other patients.  Critical care was necessary to treat or prevent imminent or life-threatening deterioration.  Critical care was time spent personally by me on the following activities: development of treatment plan with patient and/or surrogate as well as nursing, discussions with consultants, evaluation of patient's  response to treatment, examination of patient, obtaining history from patient or surrogate, ordering and performing treatments and interventions, ordering and review of laboratory studies, ordering and review of radiographic studies, pulse oximetry and re-evaluation of patient's condition.  The patients results and plan were reviewed and discussed.   Any x-rays performed were independently reviewed by myself.   Differential diagnosis were considered with the presenting HPI.  Medications  diltiazem (CARDIZEM) 1 mg/mL load via infusion 10 mg (10 mg Intravenous Bolus from Bag 07/31/17 2326)    And  diltiazem (CARDIZEM) 100 mg in dextrose 5% (1 mg/mL) infusion (0 mg/hr Intravenous Stopped 08/01/17 0019)  amiodarone (NEXTERONE PREMIX) 360-4.14 MG/200ML-% (1.8 mg/mL) IV infusion (60 mg/hr Intravenous New Bag/Given 08/01/17 0023)    Followed by  amiodarone (NEXTERONE PREMIX) 360-4.14 MG/200ML-% (1.8 mg/mL) IV infusion (has no administration in time range)  morphine 4 MG/ML injection 2 mg (2 mg Intravenous Given 07/31/17 2326)  ondansetron (ZOFRAN) injection 4 mg (4 mg Intravenous Given 07/31/17 2326)  sodium chloride 0.9 % bolus 500 mL (0 mLs Intravenous Stopped 07/31/17 2351)  morphine 4 MG/ML injection 4 mg (4 mg Intravenous Given 07/31/17 2351)  amiodarone (NEXTERONE) IV bolus only 150 mg/100 mL (150 mg Intravenous New Bag/Given 08/01/17 0021)    Vitals:   07/31/17 2325 07/31/17 2330 08/01/17 0000 08/01/17 0030  BP: 122/85 125/71 106/83 113/76  Pulse: (!) 161 (!) 159 (!) 160 (!) 148  Resp: Temp:      SpO2: 95% 95% 94% 92%    Final diagnoses:  Atrial fibrillation with rapid ventricular response (HCC)    Admission/ observation were discussed with the admitting physician, patient and/or family and they are comfortable with the plan.   Final Clinical Impressions(s) / ED Diagnoses   Final diagnoses:  Atrial fibrillation with rapid ventricular response [facility])    ED Discharge  Orders    None       Melene Plan, DO 08/01/17 [phone]

## 2017-08-01 NOTE — ED Notes (Signed)
Heart healthy lunch tray ordered 

## 2017-08-01 NOTE — ED Notes (Signed)
Pt using urinal, given toothbrush, tooth paste to brush teeth at sink.

## 2017-08-01 NOTE — Progress Notes (Signed)
  PROGRESS NOTE  Troy Jackson  RUE:[id number] DOB: February 02, 1946 DOA: 07/31/2017 PCP: Rodrigo Ran, MD   Brief Narrative: Troy Jackson is a 72 y.o. male with a history of CAD s/p PCI 2017, AFib recently diagnosed who presented with retrosternal chest pressure found to be in AFib w/RVR in the ED. Diltiazem and amiodarone infusions were started and the patient's chest pain has improved. He has been taking prednisone and augmentin (1 day left) since recent admission for COPD exacerbation due to PNA. Please see H&P from earlier this morning for full details.  Subjective: Chest pain subsided, no palpitations or dyspnea.   Objective: BP (!) 128/92   Pulse 80   Temp 97.9 F (36.6 C) (Oral)   Resp 20   Ht  (1.854 m)   Wt 99.5 kg (219 lb 6.4 oz)   SpO2 98%   BMI 28.95 kg/m   Gen: 72 y.o. male in no distress  Pulm: Non-labored breathing room air. Clear to auscultation bilaterally.  CV: Regular rate and rhythm at time of exam. No murmur, rub, or gallop. No JVD, no pedal edema.  Assessment & Plan: AFib with RVR: TSH 2.186. Possibly provoked by EtOH (had 3 drinks with dinner, rare for him) in addition to resolution of COPD, PNA, taking steroids.  - Rate control with po diltiazem, tapered off gtt. Still on amiodarone gtt, conversion to po vs. other decision deferred to cardiology.  - CHA2DS2-VASc score is 2 due to CAD and age, continue eliquis. - Maintain K > 4 and Mg > 2  CAD s/p PCI 2017 with chest pressure/pain:  - Troponin trend flat 0.03 > 0.04 > 0.04 most consistent with demand ischemia with elevated HR.  - Continue eliquis, statin, prn NTG and morphine.   Pneumonia: In stages of resolution based on CXR.  - Complete augmentin course today  COPD: No acute issues.  - Home medications continued, prn neb  ALT elevation: Mild, other LFTs wnl, possibly related to alcohol use just prior to draw.  - Suggest recheck at follow up.   Leukocytosis: No evidence of infection, so likely  reactive to steroids.  - Monitor intermittently.   Tyrone Nine, MD [facility] [email or web address] Password [facility] 08/01/2017, 3:35 PM

## 2017-08-02 DIAGNOSIS — J438 Other emphysema: Secondary | ICD-10-CM

## 2017-08-02 DIAGNOSIS — I4891 Unspecified atrial fibrillation: Secondary | ICD-10-CM | POA: Diagnosis not present

## 2017-08-02 DIAGNOSIS — R079 Chest pain, unspecified: Secondary | ICD-10-CM | POA: Diagnosis not present

## 2017-08-02 LAB — BASIC METABOLIC PANEL
Anion gap: 8 (ref 5–15)
BUN: 17 mg/dL (ref 6–20)
CHLORIDE: 100 mmol/L — AB (ref 101–111)
CO2: 30 mmol/L (ref 22–32)
CREATININE: 0.91 mg/dL (ref 0.61–1.24)
Calcium: 8 mg/dL — ABNORMAL LOW (ref 8.9–10.3)
GFR calc non Af Amer: >60 mL/min (ref >60)
Glucose, Bld: 113 mg/dL — ABNORMAL HIGH (ref 65–99)
Potassium: 4 mmol/L (ref 3.5–5.1)
SODIUM: 138 mmol/L (ref 135–145)

## 2017-08-02 LAB — MAGNESIUM: Magnesium: 2 mg/dL (ref 1.7–2.4)

## 2017-08-02 MED ORDER — METOPROLOL TARTRATE 25 MG PO TABS: 12.5000 mg | ORAL_TABLET | Freq: Two times a day (BID) | ORAL | 0 refills | Status: AC | PRN

## 2017-08-02 MED ORDER — DILTIAZEM HCL ER COATED BEADS 240 MG PO CP24: 240.0000 mg | ORAL_CAPSULE | Freq: Every day | ORAL | 1 refills | Status: AC

## 2017-08-02 MED ORDER — NITROGLYCERIN 0.4 MG SL SUBL: 0.4000 mg | SUBLINGUAL_TABLET | SUBLINGUAL | 2 refills | Status: AC | PRN

## 2017-08-02 NOTE — Progress Notes (Signed)
Discharge instruction was given to pt and family.  Alexio Sroka, RN 

## 2017-08-02 NOTE — Care Management Obs Status (Signed)
MEDICARE OBSERVATION STATUS NOTIFICATION   Patient Details  Name: Troy Jackson MRN: [id number] Date of Birth: 03-02-1946   Medicare Observation Status Notification Given:  Yes    Lawerance Sabal, RN 08/02/2017, 9:39 AM

## 2017-08-02 NOTE — Discharge Summary (Signed)
Physician Discharge Summary  Troy Jackson:[id number] DOB: [birth year] DOA: 07/31/2017  PCP: Rodrigo Ran, MD  Admit date: 07/31/2017 Discharge date: 08/02/2017  Admitted From: Home.  Disposition: Home.   Recommendations for Outpatient Follow-up:  1. Follow up with PCP in 1-2 weeks 2. Please obtain BMP/CBC in one week 3. Please follow up with cardiology Dr Thomasene Lot as recommended.  4. Recommend CXR in 4 weeks to evaluate for resolution of PNA.     Discharge Condition:stable.  CODE STATUS: full code.  Diet recommendation: Heart Healthy  Brief/Interim Summary: Troy Jackson is a 72 y.o. male with a history of CAD s/p PCI 2017, AFib recently diagnosed who presented with retrosternal chest pressure found to be in AFib w/RVR in the ED. Diltiazem and amiodarone infusions were started and the patient's chest pain has improved.he was switched to oral cardizem on discharge.     Discharge Diagnoses:  Principal Problem:   Atrial fibrillation with RVR (HCC) Active Problems:   Chest pain   COPD (chronic obstructive pulmonary disease) (HCC)   CAD (coronary artery disease)  COPD No wheezing heard today.    Atrial fibrillation with RVR: Resolved after amiodarone and cardizem gtt. Transitioned to oral cardizem 240 mg daily on discharge with prn metoprolol as needed for HR >120.  Continue with eliquis and recommend outpatient follow up with Dr Swaziland.    Chest pressure : resolved.  No further ischemic work up as per cardiology.   Recent PNA:  Completed the antibiotics.    Discharge Instructions  Discharge Instructions    Diet - low sodium heart healthy   Complete by:  As directed    Discharge instructions   Complete by:  As directed    Please follow up with cardiology as recommended.     Allergies as of 08/02/2017      Reactions   Crestor [rosuvastatin Calcium] Other (See Comments)   All statins: Myalgias      Medication List    STOP taking these medications    amoxicillin-clavulanate 875-125 MG tablet Commonly known as:  AUGMENTIN   predniSONE 20 MG tablet Commonly known as:  DELTASONE     TAKE these medications   albuterol 108 (90 Base) MCG/ACT inhaler Commonly known as:  PROVENTIL HFA;VENTOLIN HFA Inhale 1-2 puffs into the lungs every 6 (six) hours as needed for wheezing or shortness of breath.   ALPRAZolam 0.5 MG tablet Commonly known as:  XANAX Take 0.5-1 mg by mouth 3 (three) times daily as needed for anxiety. For anxiety   apixaban 5 MG Tabs tablet Commonly known as:  ELIQUIS Take 1 tablet (5 mg total) by mouth 2 (two) times daily.   BLUE-EMU SUPER STRENGTH Crea Apply 1 application topically as needed (back pain).   budesonide-formoterol 160-4.5 MCG/ACT inhaler Commonly known as:  SYMBICORT Inhale 2 puffs into the lungs 2 (two) times daily.   diltiazem 240 MG 24 hr capsule Commonly known as:  CARDIZEM CD Take 1 capsule (240 mg total) by mouth daily. Start taking on:  08/03/2017 What changed:    medication strength  how much to take   nitroGLYCERIN 0.4 MG SL tablet Commonly known as:  NITROSTAT Place 1 tablet (0.4 mg total) under the tongue every 5 (five) minutes as needed for chest pain.   NYQUIL SEVERE COLD/FLU 5-6.25-10-325 MG/15ML Liqd Generic drug:  Phenyleph-Doxylamine-DM-APAP Take 15 mLs by mouth at bedtime as needed (for sleep).   pantoprazole 40 MG tablet Commonly known as:  PROTONIX Take 1 tablet (40  mg total) by mouth daily.   simvastatin 20 MG tablet Commonly known as:  ZOCOR Take 0.5 tablets (10 mg total) by mouth daily at 6 PM.      Follow-up Information    Rodrigo Ran, MD. Schedule an appointment as soon as possible for a visit in 1 week(s).   Specialty:  Internal Medicine Contact information: [address] [place] [address] [phone]        Swaziland, Peter M, MD .   Specialty:  Cardiology Contact information: [address] [place]  [address] [phone]          Allergies  Allergen Reactions  . Crestor [Rosuvastatin Calcium] Other (See Comments)    All statins: Myalgias     Consultations: cardiology  Procedures/Studies: Dg Chest Portable 1 View  Result Date: 07/31/2017 CLINICAL DATA:  Rapid heart rate and chest pain EXAM: PORTABLE CHEST 1 VIEW COMPARISON:  07/24/2017, 02/03/2016 FINDINGS: Mixed interstitial and ground-glass opacity in the right upper lobe, slightly improved. Hyperinflation. No new opacity. Normal heart size. No pneumothorax. IMPRESSION: Partial but incomplete clearing of mixed interstitial and ground-glass opacity in the right upper lobe, likely pneumonia. Continued imaging follow-up to resolution is recommended. Electronically Signed   By: Jasmine Pang M.D.   On: 07/31/2017 23:49   Dg Chest Port 1 View  Result Date: 07/24/2017 CLINICAL DATA:  Shortness of breath, cough, fever EXAM: PORTABLE CHEST 1 VIEW COMPARISON:  02/03/2016 FINDINGS: There is right upper lobe airspace disease most consistent with pneumonia. There is no pleural effusion or pneumothorax. The heart and mediastinal contours are unremarkable. The osseous structures are unremarkable. IMPRESSION: Right upper lobe pneumonia. Followup PA and lateral chest X-ray is recommended in 3-4 weeks following trial of antibiotic therapy to ensure resolution and exclude underlying malignancy. Electronically Signed   By: Elige Ko   On: 07/24/2017 12:37     Subjective: No new complaints of chest pain or sob or palpitations.   Discharge Exam: Vitals:   08/02/17 0507 08/02/17 0942  BP: 127/79 125/70  Pulse: 69   Resp: 17   Temp: 97.7 F (36.5 C)   SpO2: 97%    Vitals:   08/01/17 2147 08/01/17 2315 08/02/17 0507 08/02/17 0942  BP:  (!) 142/72 127/79 125/70  Pulse:  73 69   Resp:  18 17   Temp:  97.8 F (36.6 C) 97.7 F (36.5 C)   TempSrc:  Oral Oral   SpO2: 93% 94% 97%   Weight:   98.3 kg (216 lb 12.8 oz)   Height:         General: Pt is alert, awake, not in acute distress Cardiovascular: RRR, S1/S2 +, no rubs, no gallops Respiratory: CTA bilaterally, no wheezing, no rhonchi Abdominal: Soft, NT, ND, bowel sounds + Extremities: no edema, no cyanosis    The results of significant diagnostics from this hospitalization (including imaging, microbiology, ancillary and laboratory) are listed below for reference.     Microbiology: Recent Results (from the past 240 hour(s))  Blood culture (routine x 2)     Status: None   Collection Time: 07/24/17 12:55 PM  Result Value Ref Range Status   Specimen Description BLOOD LEFT ANTECUBITAL  Final   Special Requests   Final    BOTTLES DRAWN AEROBIC AND ANAEROBIC Blood Culture adequate volume   Culture   Final    NO GROWTH 5 DAYS Performed at [facility], [address] [place], [place] [address]    Report Status  07/29/2017 FINAL  Final  Blood culture (routine x 2)     Status: None   Collection Time: 07/24/17  1:07 PM  Result Value Ref Range Status   Specimen Description BLOOD RIGHT ANTECUBITAL  Final   Special Requests   Final    BOTTLES DRAWN AEROBIC AND ANAEROBIC Blood Culture results may not be optimal due to an excessive volume of blood received in culture bottles   Culture   Final    NO GROWTH 5 DAYS Performed at [facility], [address] [place], [place] [address]    Report Status 07/29/2017 FINAL  Final  MRSA PCR Screening     Status: None   Collection Time: 07/27/17  1:40 AM  Result Value Ref Range Status   MRSA by PCR NEGATIVE NEGATIVE Final    Comment:        The GeneXpert MRSA Assay (FDA approved for NASAL specimens only), is one component of a comprehensive MRSA colonization surveillance program. It is not intended to diagnose MRSA infection nor to guide or monitor treatment for MRSA infections. Performed at [facility], [address] [place], [place] [address]      Labs: BNP (last 3 results) Recent Labs     07/24/17 1228  BNP 120.1*   Basic Metabolic Panel: Recent Labs  Lab 07/27/17 0348 07/28/17 0536 07/31/17 2255 07/31/17 2317 08/01/17 0336 08/02/17 0450  NA 139 140 138 137 139 138  K 4.0 4.1 3.7 4.1 3.8 4.0  CL 108 104 103 100* 103 100*  CO2 --  28 30  GLUCOSE 203* 183* 194* 192* 176* 113*  BUN 17 29* CREATININE 0.86 1.01 1.05 0.90 1.03 0.91  CALCIUM 8.5* 8.6* 8.3*  --  7.7* 8.0*  MG  --   --  2.0  --  1.9 2.0   Liver Function Tests: Recent Labs  Lab 08/01/17 0336  AST 20  ALT 79*  ALKPHOS 47  BILITOT 0.5  PROT 4.6*  ALBUMIN 2.0*   No results for input(s): LIPASE, AMYLASE in the last 168 hours. No results for input(s): AMMONIA in the last 168 hours. CBC: Recent Labs  Lab 07/28/17 0536 07/31/17 2255 07/31/17 2317 08/01/17 0336  WBC 9.0 12.9*  --  10.7*  HGB 10.9* 14.3 15.0 12.0*  HCT 33.1* 43.3 44.0 36.2*  MCV 93.8 92.7  --  94.3  PLT 329 546*  --  417*   Cardiac Enzymes: Recent Labs  Lab 07/27/17 0849 07/27/17 1512 08/01/17 0336 08/01/17 0845 08/01/17 1235  TROPONINI <0.03 <0.03 0.03* 0.04* 0.04*   BNP: Invalid input(s): POCBNP CBG: No results for input(s): GLUCAP in the last 168 hours. D-Dimer No results for input(s): DDIMER in the last 72 hours. Hgb A1c No results for input(s): HGBA1C in the last 72 hours. Lipid Profile No results for input(s): CHOL, HDL, LDLCALC, TRIG, CHOLHDL, LDLDIRECT in the last 72 hours. Thyroid function studies Recent Labs    07/31/17 2255  TSH 2.185   Anemia work up No results for input(s): VITAMINB12, FOLATE, FERRITIN, TIBC, IRON, RETICCTPCT in the last 72 hours. Urinalysis    Component Value Date/Time   COLORURINE AMBER (A) 07/24/2017 1228   APPEARANCEUR CLEAR 07/24/2017 1228   LABSPEC 1.016 07/24/2017 1228   PHURINE 6.0 07/24/2017 1228   GLUCOSEU 50 (A) 07/24/2017 1228   HGBUR NEGATIVE 07/24/2017 1228   BILIRUBINUR NEGATIVE 07/24/2017 1228   KETONESUR NEGATIVE 07/24/2017  1228  PROTEINUR 30 (A) 07/24/2017 1228   NITRITE NEGATIVE 07/24/2017 1228   LEUKOCYTESUR NEGATIVE 07/24/2017 1228   Sepsis Labs Invalid input(s): PROCALCITONIN,  WBC,  LACTICIDVEN Microbiology Recent Results (from the past 240 hour(s))  Blood culture (routine x 2)     Status: None   Collection Time: 07/24/17 12:55 PM  Result Value Ref Range Status   Specimen Description BLOOD LEFT ANTECUBITAL  Final   Special Requests   Final    BOTTLES DRAWN AEROBIC AND ANAEROBIC Blood Culture adequate volume   Culture   Final    NO GROWTH 5 DAYS Performed at [facility], [address], [place], [place] [address]    Report Status 07/29/2017 FINAL  Final  Blood culture (routine x 2)     Status: None   Collection Time: 07/24/17  1:07 PM  Result Value Ref Range Status   Specimen Description BLOOD RIGHT ANTECUBITAL  Final   Special Requests   Final    BOTTLES DRAWN AEROBIC AND ANAEROBIC Blood Culture results may not be optimal due to an excessive volume of blood received in culture bottles   Culture   Final    NO GROWTH 5 DAYS Performed at [facility], [address], [place], [place] [address]    Report Status 07/29/2017 FINAL  Final  MRSA PCR Screening     Status: None   Collection Time: 07/27/17  1:40 AM  Result Value Ref Range Status   MRSA by PCR NEGATIVE NEGATIVE Final    Comment:        The GeneXpert MRSA Assay (FDA approved for NASAL specimens only), is one component of a comprehensive MRSA colonization surveillance program. It is not intended to diagnose MRSA infection nor to guide or monitor treatment for MRSA infections. Performed at [facility], [address] [place], [place] [address]      Time coordinating discharge: 36 minutes  SIGNED:   Kathlen Mody, MD  [facility] 08/02/2017, 12:15 PM Pager   If 7PM-7AM, please contact night-coverage [email or web address] Password [email or web address]

## 2017-08-02 NOTE — Progress Notes (Addendum)
Progress Note  Patient Name: Troy Jackson Date of Encounter: 08/02/2017  Primary Cardiologist: Peter Swaziland, MD   Subjective   Patient reports feeling back to baseline. Denies recurrence of chest pain or palpitations. Feels his breathing is stable. Discussed action plan in the event that he has another episode of atrial fibrillation with RVR. Encourage him to continue a healthy active lifestyle.  Inpatient Medications    Scheduled Meds: . apixaban  5 mg Oral BID  . diltiazem  240 mg Oral Daily  . mometasone-formoterol  2 puff Inhalation BID  . pantoprazole  40 mg Oral Daily  . simvastatin  10 mg Oral q1800   Continuous Infusions:  PRN Meds: acetaminophen **OR** acetaminophen, albuterol, ALPRAZolam, nitroGLYCERIN, ondansetron **OR** ondansetron (ZOFRAN) IV   Vital Signs    Vitals:   08/01/17 2010 08/01/17 2147 08/01/17 2315 08/02/17 0507  BP: 140/74  (!) 142/72 127/79  Pulse: 74  73 69  Resp: Temp: 98 F (36.7 C)  97.8 F (36.6 C) 97.7 F (36.5 C)  TempSrc: Oral  Oral Oral  SpO2: 96% 93% 94% 97%  Weight:    216 lb 12.8 oz (98.3 kg)  Height:        Intake/Output Summary (Last 24 hours) at 08/02/2017 0750 Last data filed at 08/02/2017 0509 Gross per 24 hour  Intake 1134.32 ml  Output 1150 ml  Net -15.68 ml   Filed Weights   08/01/17 1516 08/02/17 0507  Weight: 219 lb 6.4 oz (99.5 kg) 216 lb 12.8 oz (98.3 kg)    Telemetry    NSR - Personally Reviewed  Physical Exam   GEN: WDWN, laying in bed in no acute distress.   Neck: No JVD, no carotid bruits Cardiac: RRR, no murmurs, rubs, or gallops.  Respiratory: diffuse expiratory wheeze; no rales or rhonchi GI: NABS, Soft, nontender, non-distended  MS: trace LE edema; No deformity. Neuro:  Nonfocal, moving all extremities spontaneously Psych: Normal affect   Labs    Chemistry Recent Labs  Lab 07/31/17 2255 07/31/17 2317 08/01/17 0336 08/02/17 0450  NA 138 137 139 138  K 3.7 4.1 3.8  4.0  CL 103 100* 103 100*  CO2 26  --  28 30  GLUCOSE 194* 192* 176* 113*  BUN CREATININE 1.05 0.90 1.03 0.91  CALCIUM 8.3*  --  7.7* 8.0*  PROT  --   --  4.6*  --   ALBUMIN  --   --  2.0*  --   AST  --   --  20  --   ALT  --   --  79*  --   ALKPHOS  --   --  47  --   BILITOT  --   --  0.5  --   GFRNONAA >60  --  >60 >60  GFRAA >60  --  >60 >60  ANIONGAP 9  --  8 8     Hematology Recent Labs  Lab 07/28/17 0536 07/31/17 2255 07/31/17 2317 08/01/17 0336  WBC 9.0 12.9*  --  10.7*  RBC 3.53* 4.67  --  3.84*  HGB 10.9* 14.3 15.0 12.0*  HCT 33.1* 43.3 44.0 36.2*  MCV 93.8 92.7  --  94.3  MCH 30.9 30.6  --  31.3  MCHC 32.9 33.0  --  33.1  RDW 12.3 12.0  --  12.2  PLT 329 546*  --  417*    Cardiac Enzymes Recent Labs  Lab 07/27/17 1512 08/01/17 0336 08/01/17 0845 08/01/17 1235  TROPONINI <0.03 0.03* 0.04* 0.04*    Recent Labs  Lab 07/31/17 2315  TROPIPOC 0.01     BNPNo results for input(s): BNP, PROBNP in the last 168 hours.   DDimer No results for input(s): DDIMER in the last 168 hours.   Radiology    Dg Chest Portable 1 View  Result Date: 07/31/2017 CLINICAL DATA:  Rapid heart rate and chest pain EXAM: PORTABLE CHEST 1 VIEW COMPARISON:  07/24/2017, 02/03/2016 FINDINGS: Mixed interstitial and ground-glass opacity in the right upper lobe, slightly improved. Hyperinflation. No new opacity. Normal heart size. No pneumothorax. IMPRESSION: Partial but incomplete clearing of mixed interstitial and ground-glass opacity in the right upper lobe, likely pneumonia. Continued imaging follow-up to resolution is recommended. Electronically Signed   By: Jasmine Pang M.D.   On: 07/31/2017 23:49    Cardiac Studies   None  Patient Profile     72 y.o. male with PMH of CAD s/p PCI/DES to Cx 2007, HLD, COPD, diverticulitis, intolerant to BBlockers (bradycardia) and brilinta (dyspnea), who presented with Afib RVR, for which cardiology is following   Assessment &  Plan    1. Atrial fibrillation with RVR: HR initially unresponsive to IV diltiazem and amiodarone was started, after which patient converted to NSR. Decision made to stop amiodarone and start po diltiazem for rate control. He was started on eliquis during admission last week and reported compliance.  - Continue eliquis for CHADS2VASC score of 2 (Vasc and age >65) - Continue po diltiazem  - Recommend prn beta blocker at discharge for episodes of RVR.   2. Chest pain: occurred in the setting of Afib RVR and resolved with rate control and conversion to NSR. He had minimal elevation of troponin to 0.08, consistent with demand ischemia in the setting of RVR.  - No further ischemic work-up at this time  3. Recent PNA: completing course of antibiotics. Still with some wheezing on exam.  - Will need follow-up CXR in 4-6 weeks to monitor for resolution of PNA  Patient requested to only follow with Dr. Sw[place]iland outpatient. Unfortunately next available appointment in 11/2017; he is on the wait list for an earlier appointment. If he continues to have episodes of afib RVR with increasing need for prn BBlocker, encouraged him to see one of the cardiology APPs for further management. Encouraged him to follow up with his PCP in the next 1-2 weeks  For questions or updates, please contact [facility] Please consult [email or web address] for contact info under Cardiology/STEMI.      Signed, Beatriz Stallion, PA-C  08/02/2017, 7:50 AM   [phone]  History and all data above reviewed.  Patient examined.  I agree with the findings as above. No further arrhythmia.  No chest pain.  No SOB.  The patient exam reveals COR:RRR  ,  Lungs: Clear  ,  Abd: Positive bowel sounds, no rebound no guarding, Ext No edema  .  All available labs, radiology testing, previous records reviewed. Agree with documented assessment and plan. Atrial fib:  Home with increased Cardizem and pill in pocket beta blocker.  Continue anticoagulation.     Rollene Rotunda  8:43 AM  08/02/2017

## 2017-08-03 ENCOUNTER — Telehealth: Payer: Self-pay | Admitting: Cardiology

## 2017-08-03 NOTE — Telephone Encounter (Signed)
Follow Up:    Pt was discharged from the hospital yesterday.His wife wants to know when does he need his Post Hospital follow up appt please?

## 2017-08-03 NOTE — Telephone Encounter (Signed)
Returned call to patient's wife she requested post hospital appointment with Dr.Jordan only.Appointment scheduled with Dr.Jordan 08/16/17 at 4:15 pm.

## 2017-08-14 NOTE — Progress Notes (Signed)
Cardiology Office Note    Date:  08/16/2017   ID:  Troy Jackson, DOB [birth year], MRN [id number]  PCP:  Rodrigo Ran, MD  Cardiologist:  Rathana Viveros [place], MD    History of Present Illness:  Troy Jackson is a 72 y.o. male seen for follow up CAD. He has a history of HLD, tobacco abuse, COPD, and GERD. He had remote stress test in 2004 that was normal. He was admitted in November 2017 after presenting with severe chest pain. Troponin was mildly elevated at .06. Echo showed normal LV function. Cardiac cath showed 90% proximal to mid LCx stenosis treated successfully with DES. Discharged on ASA, Plavix, Vytorin. Beta blocker held due to bradycardia. He was intolerant of Brilinta due to dyspnea.   He was admitted 5/13-5/17/19 with PNA, sepsis, and acute respiratory failure. He went into Afib with RVR. He was started on IV Cardizem and converted to NSR. Troponin levels were normal. Echo looked OK. DC home on Eliquis. He was readmitted on 07/31/17 with chest pain and was back in AFib. Converted after IV cardizem and amiodarone. DC on Cardizem alone. Did not want to commit to AAD therapy yet.   On follow up today he is doing very well.  He is back at work. He is still nervous about his Afib recurring but so far his pulse has been good. Has a follow up CXR scheduled with Dr. Waynard Edwards on July 1. No dyspnea, cough, chest pain. No edema. He states he has given up drinking alcohol.   Past Medical History:  Diagnosis Date  . Anxiety   . COPD (chronic obstructive pulmonary disease) (HCC)   . Coronary artery disease 2017   stent  . Diverticulitis   . GERD (gastroesophageal reflux disease)   . Hyperlipidemia   . Tobacco abuse 2017    Past Surgical History:  Procedure Laterality Date  . CARDIAC CATHETERIZATION N/A 02/03/2016   Procedure: Left Heart Cath and Coronary Angiography;  Surgeon: Eugenio Dollins M [place], MD;  Location: [facility] INVASIVE CV LAB;  Service: Cardiovascular;  Laterality: N/A;  . CARDIAC  CATHETERIZATION N/A 02/03/2016   Procedure: Coronary Stent Intervention;  Surgeon: Shataria Crist M [place], MD;  Location: [facility] INVASIVE CV LAB;  Service: Cardiovascular;  Laterality: N/A;  . CORONARY ANGIOPLASTY WITH STENT PLACEMENT  02/03/2016  . SHOULDER SURGERY Right    as child  . SHOULDER SURGERY Left    as child    Current Medications: Outpatient Medications Prior to Visit  Medication Sig Dispense Refill  . albuterol (PROVENTIL HFA;VENTOLIN HFA) 108 (90 Base) MCG/ACT inhaler Inhale 1-2 puffs into the lungs every 6 (six) hours as needed for wheezing or shortness of breath.    . ALPRAZolam (XANAX) 0.5 MG tablet Take 0.5-1 mg by mouth 3 (three) times daily as needed for anxiety. For anxiety    . apixaban (ELIQUIS) 5 MG TABS tablet Take 1 tablet (5 mg total) by mouth 2 (two) times daily. 60 tablet 0  . budesonide-formoterol (SYMBICORT) 160-4.5 MCG/ACT inhaler Inhale 2 puffs into the lungs 2 (two) times daily.    Marland Kitchen diltiazem (CARDIZEM CD) 240 MG 24 hr capsule Take 1 capsule (240 mg total) by mouth daily. 30 capsule 1  . Liniments (BLUE-EMU SUPER STRENGTH) CREA Apply 1 application topically as needed (back pain).    . metoprolol tartrate (LOPRESSOR) 25 MG tablet Take 0.5 tablets (12.5 mg total) by mouth 2 (two) times daily as needed (as needed fOR HR> 120/min.). 60 tablet 0  . nitroGLYCERIN (NITROSTAT)  0.4 MG SL tablet Place 1 tablet (0.4 mg total) under the tongue every 5 (five) minutes as needed for chest pain. 30 tablet 2  . pantoprazole (PROTONIX) 40 MG tablet Take 1 tablet (40 mg total) by mouth daily. 30 tablet 0  . Phenyleph-Doxylamine-DM-APAP (NYQUIL SEVERE COLD/FLU) 5-6.25-10-325 MG/15ML LIQD Take 15 mLs by mouth at bedtime as needed (for sleep).    . simvastatin (ZOCOR) 20 MG tablet Take 0.5 tablets (10 mg total) by mouth daily at 6 PM.     No facility-administered medications prior to visit.      Allergies:   Crestor [rosuvastatin calcium]   Social History   Socioeconomic History  .  Marital status: Married    Spouse name: Not on file  . Number of children: 2  . Years of education: Not on file  . Highest education level: Not on file  Occupational History  . Occupation: retired Sales promotion account executiveplumbing; works as a Lobbyistfirefighter  Social Needs  . Financial resource strain: Not on file  . Food insecurity:    Worry: Not on file    Inability: Not on file  . Transportation needs:    Medical: Not on file    Non-medical: Not on file  Tobacco Use  . Smoking status: Former Smoker    Packs/day: 1.00    Years: 60.00    Pack years: 60.00    Types: Cigarettes    Last attempt to quit: 2017    Years since quitting: 2.4  . Smokeless tobacco: Never Used  Substance and Sexual Activity  . Alcohol use: Yes    Alcohol/week: 12.0 oz    Types: 20 Cans of beer per week    Comment: cannot remember when his last day was without a drink  . Drug use: No  . Sexual activity: Not on file  Lifestyle  . Physical activity:    Days per week: Not on file    Minutes per session: Not on file  . Stress: Not on file  Relationships  . Social connections:    Talks on phone: Not on file    Gets together: Not on file    Attends religious service: Not on file    Active member of club or organization: Not on file    Attends meetings of clubs or organizations: Not on file    Relationship status: Not on file  Other Topics Concern  . Not on file  Social History Narrative  . Not on file     Family History:  The patient's family history includes Alcohol abuse in his brother, father, and paternal grandfather; Alzheimer's disease in his mother; Heart attack in his father and paternal grandfather; Heart disease in his father; Lung cancer in his father.   ROS:   Please see the history of present illness.    ROS All other systems reviewed and are negative.   PHYSICAL EXAM:   VS:  BP (!) 149/75   Pulse 65   Ht 6\' 1"  (1.854 m)   Wt 201 lb 12.8 oz (91.5 kg)   SpO2 95%   BMI 26.62 kg/m    GENERAL:  Well  appearing WM in NAD HEENT:  PERRL, EOMI, sclera are clear. Oropharynx is clear. NECK:  No jugular venous distention, carotid upstroke brisk and symmetric, no bruits, no thyromegaly or adenopathy LUNGS:  Clear to auscultation bilaterally CHEST:  Unremarkable HEART:  RRR,  PMI not displaced or sustained,S1 and S2 within normal limits, no S3, no S4: no clicks, no rubs,  no murmurs ABD:  Soft, nontender. BS +, no masses or bruits. No hepatomegaly, no splenomegaly EXT:  2 + pulses throughout, no edema, no cyanosis no clubbing SKIN:  Warm and dry.  No rashes NEURO:  Alert and oriented x 3. Cranial nerves II through XII intact. PSYCH:  Cognitively intact    Wt Readings from Last 3 Encounters:  08/16/17 201 lb 12.8 oz (91.5 kg)  08/02/17 216 lb 12.8 oz (98.3 kg)  07/24/17 212 lb 11.9 oz (96.5 kg)      Studies/Labs Reviewed:   EKG:  EKG is not ordered today.    Recent Labs: 07/24/2017: B Natriuretic Peptide 120.1 07/31/2017: TSH 2.185 08/01/2017: ALT 79; Hemoglobin 12.0; Platelets 417 08/02/2017: BUN 17; Creatinine, Ser 0.91; Magnesium 2.0; Potassium 4.0; Sodium 138   Lipid Panel    Component Value Date/Time   CHOL 195 02/03/2016 1005   TRIG 100 02/03/2016 1005   HDL 41 02/03/2016 1005   CHOLHDL 4.8 02/03/2016 1005   VLDL 20 02/03/2016 1005   LDLCALC 134 (H) 02/03/2016 1005    Echo: 07/27/17: Study Conclusions  - Left ventricle: The cavity size was normal. Posterior wall   thickness was increased in a pattern of mild LVH. Systolic   function was normal. The estimated ejection fraction was in the   range of 60% to 65%. Wall motion was normal; there were no   regional wall motion abnormalities. - Aortic valve: Transvalvular velocity was within the normal range.   There was no stenosis. There was no regurgitation. - Mitral valve: Transvalvular velocity was within the normal range.   There was no evidence for stenosis. There was no regurgitation. - Right ventricle: The cavity size  was normal. Wall thickness was   normal. Systolic function was normal. - Tricuspid valve: There was no regurgitation.    ASSESSMENT:    1. Paroxysmal atrial fibrillation (HCC)   2. Coronary artery disease involving native coronary artery of native heart with other form of angina pectoris (HCC)   3. Hyperlipidemia LDL goal <70      PLAN:  In order of problems listed above:  1. CAD with history of NSTEMI, s/p stenting of the LCx in November 2017 with DES.   No BB 2nd resting bradycardia. He is asymptomatic. Will discontinue Plavix in one month.  2. Atrial fibrillation paroxysmal. In setting of acute PNA. I suspect even recurrence was related to this. Will continue rate control with Cardizem and prn metoprolol. Continue Eliquis. If Afib recurs would need to consider Tikosyn vs ablation.  3. Hyperlipidemia: pt is on Vytorin, intolerant to Crestor. Follow up lab work with Dr. Waynard Edwards. On low dose Zocor. If still not at goal would consider increase dose or PCSK 9 inhibitor.  4. Tobacco use: now quit. Congratulated.  5. HTN controlled.    I will follow up in 3 months.  Medication Adjustments/Labs and Tests Ordered: Current medicines are reviewed at length with the patient today.  Concerns regarding medicines are outlined above.  Medication changes, Labs and Tests ordered today are listed in the Patient Instructions below. There are no Patient Instructions on file for this visit.   Signed, Saphira Lahmann [place], MD  08/16/2017 5:17 PM    [facility] [address], [place], [place], [address] [phone]

## 2017-08-16 ENCOUNTER — Ambulatory Visit: Payer: Medicare Other | Admitting: Cardiology

## 2017-08-16 ENCOUNTER — Encounter: Payer: Self-pay | Admitting: Cardiology

## 2017-08-16 VITALS — BP 149/75 | HR 65 | Ht 73.0 in | Wt 201.8 lb

## 2017-08-16 DIAGNOSIS — E785 Hyperlipidemia, unspecified: Secondary | ICD-10-CM | POA: Diagnosis not present

## 2017-08-16 DIAGNOSIS — I480 Paroxysmal atrial fibrillation: Secondary | ICD-10-CM

## 2017-08-16 DIAGNOSIS — I25118 Atherosclerotic heart disease of native coronary artery with other forms of angina pectoris: Secondary | ICD-10-CM | POA: Diagnosis not present

## 2017-09-18 ENCOUNTER — Other Ambulatory Visit: Payer: Self-pay | Admitting: Internal Medicine

## 2017-09-18 DIAGNOSIS — R222 Localized swelling, mass and lump, trunk: Secondary | ICD-10-CM

## 2017-09-28 ENCOUNTER — Ambulatory Visit
Admission: RE | Admit: 2017-09-28 | Discharge: 2017-09-28 | Disposition: A | Discharge status: Home / Self Care | Payer: Medicare Other | Source: Ambulatory Visit | Attending: Internal Medicine | Admitting: Internal Medicine

## 2017-09-28 DIAGNOSIS — R222 Localized swelling, mass and lump, trunk: Secondary | ICD-10-CM

## 2017-09-28 MED ORDER — IOPAMIDOL (ISOVUE-300) INJECTION 61%
60.0000 mL | Freq: Once | INTRAVENOUS | Status: AC | PRN
  Administered 2017-09-28: 60 mL via INTRAVENOUS

## 2017-11-19 NOTE — Progress Notes (Signed)
Cardiology Office Note    Date:  11/23/2017   ID:  Troy Jackson, DOB [birth year], MRN [id number]  PCP:  Troy Ran, MD  Cardiologist:  Troy Lant [place], MD    History of Present Illness:  Troy Jackson is a 72 y.o. male seen for follow up CAD. He has a history of HLD, tobacco abuse, COPD, and GERD. He had remote stress test in 2004 that was normal. He was admitted in November 2017 after presenting with severe chest pain. Troponin was mildly elevated at .06. Echo showed normal LV function. Cardiac cath showed 90% proximal to mid LCx stenosis treated successfully with DES. Discharged on ASA, Plavix, Vytorin. Beta blocker held due to bradycardia. He was intolerant of Brilinta due to dyspnea.   He was admitted 5/13-5/17/19 with PNA, sepsis, and acute respiratory failure. He went into Afib with RVR. He was started on IV Cardizem and converted to NSR. Troponin levels were normal. Echo looked OK. DC home on Eliquis. Antiplatelet therapy stopped. He was readmitted on 07/31/17 with chest pain and was back in AFib. Converted after IV cardizem and amiodarone. DC on Cardizem alone. Did not want to commit to AAD therapy yet.   On follow up today he is doing very well.  He has some back pain and had recent injection.  He is abstaining from alcohol.  He had follow up Chest CT in July showing some post infectious scarring in the RUL - improving.  No dyspnea, cough, chest pain. No edema.   Past Medical History:  Diagnosis Date  . Anxiety   . COPD (chronic obstructive pulmonary disease) (HCC)   . Coronary artery disease 2017   stent  . Diverticulitis   . GERD (gastroesophageal reflux disease)   . Hyperlipidemia   . Tobacco abuse 2017    Past Surgical History:  Procedure Laterality Date  . CARDIAC CATHETERIZATION N/A 02/03/2016   Procedure: Left Heart Cath and Coronary Angiography;  Surgeon: Troy Ortner M [place], MD;  Location: [facility] INVASIVE CV LAB;  Service: Cardiovascular;  Laterality: N/A;  . CARDIAC  CATHETERIZATION N/A 02/03/2016   Procedure: Coronary Stent Intervention;  Surgeon: Troy Hollman M [place], MD;  Location: [facility] INVASIVE CV LAB;  Service: Cardiovascular;  Laterality: N/A;  . CORONARY ANGIOPLASTY WITH STENT PLACEMENT  02/03/2016  . SHOULDER SURGERY Right    as child  . SHOULDER SURGERY Left    as child    Current Medications: Outpatient Medications Prior to Visit  Medication Sig Dispense Refill  . albuterol (PROVENTIL HFA;VENTOLIN HFA) 108 (90 Base) MCG/ACT inhaler Inhale 1-2 puffs into the lungs every 6 (six) hours as needed for wheezing or shortness of breath.    . ALPRAZolam (XANAX) 0.5 MG tablet Take 0.5-1 mg by mouth 3 (three) times daily as needed for anxiety. For anxiety    . apixaban (ELIQUIS) 5 MG TABS tablet Take 1 tablet (5 mg total) by mouth 2 (two) times daily. 60 tablet 0  . budesonide-formoterol (SYMBICORT) 160-4.5 MCG/ACT inhaler Inhale 2 puffs into the lungs 2 (two) times daily.    Marland Kitchen diltiazem (CARDIZEM CD) 240 MG 24 hr capsule Take 1 capsule (240 mg total) by mouth daily. 30 capsule 1  . Liniments (BLUE-EMU SUPER STRENGTH) CREA Apply 1 application topically as needed (back pain).    . nitroGLYCERIN (NITROSTAT) 0.4 MG SL tablet Place 1 tablet (0.4 mg total) under the tongue every 5 (five) minutes as needed for chest pain. 30 tablet 2  . pantoprazole (PROTONIX) 40 MG tablet Take  1 tablet (40 mg total) by mouth daily. 30 tablet 0  . Phenyleph-Doxylamine-DM-APAP (NYQUIL SEVERE COLD/FLU) 5-6.25-10-325 MG/15ML LIQD Take 15 mLs by mouth at bedtime as needed (for sleep).    . simvastatin (ZOCOR) 20 MG tablet Take 0.5 tablets (10 mg total) by mouth daily at 6 PM.    . metoprolol tartrate (LOPRESSOR) 25 MG tablet Take 0.5 tablets (12.5 mg total) by mouth 2 (two) times daily as needed (as needed fOR HR> 120/min.). 60 tablet 0   No facility-administered medications prior to visit.      Allergies:   Crestor [rosuvastatin calcium]   Social History   Socioeconomic History  .  Marital status: Married    Spouse name: Not on file  . Number of children: 2  . Years of education: Not on file  . Highest education level: Not on file  Occupational History  . Occupation: retired Sales promotion account executive; works as a Lobbyist  . Financial resource strain: Not on file  . Food insecurity:    Worry: Not on file    Inability: Not on file  . Transportation needs:    Medical: Not on file    Non-medical: Not on file  Tobacco Use  . Smoking status: Former Smoker    Packs/day: 1.00    Years: 60.00    Pack years: 60.00    Types: Cigarettes    Last attempt to quit: 2017    Years since quitting: 2.6  . Smokeless tobacco: Never Used  Substance and Sexual Activity  . Alcohol use: Yes    Alcohol/week: 20.0 standard drinks    Types: 20 Cans of beer per week    Comment: cannot remember when his last day was without a drink  . Drug use: No  . Sexual activity: Not on file  Lifestyle  . Physical activity:    Days per week: Not on file    Minutes per session: Not on file  . Stress: Not on file  Relationships  . Social connections:    Talks on phone: Not on file    Gets together: Not on file    Attends religious service: Not on file    Active member of club or organization: Not on file    Attends meetings of clubs or organizations: Not on file    Relationship status: Not on file  Other Topics Concern  . Not on file  Social History Narrative  . Not on file     Family History:  The patient's family history includes Alcohol abuse in his brother, father, and paternal grandfather; Alzheimer's disease in his mother; Heart attack in his father and paternal grandfather; Heart disease in his father; Lung cancer in his father.   ROS:   Please see the history of present illness.    ROS All other systems reviewed and are negative.   PHYSICAL EXAM:   VS:  BP (!) 142/70 (BP Location: Left Arm, Patient Position: Sitting)   Pulse 76   Ht 6\' 1"  (1.854 m)   Wt 205 lb 6.4 oz  (93.2 kg)   SpO2 95%   BMI 27.10 kg/m    GENERAL:  Well appearing WM in NAD HEENT:  PERRL, EOMI, sclera are clear. Oropharynx is clear. NECK:  No jugular venous distention, carotid upstroke brisk and symmetric, no bruits, no thyromegaly or adenopathy LUNGS:  Clear to auscultation bilaterally CHEST:  Unremarkable HEART:  RRR,  PMI not displaced or sustained,S1 and S2 within normal limits, no S3, no  S4: no clicks, no rubs, no murmurs ABD:  Soft, nontender. BS +, no masses or bruits. No hepatomegaly, no splenomegaly EXT:  2 + pulses throughout, no edema, no cyanosis no clubbing SKIN:  Warm and dry.  No rashes NEURO:  Alert and oriented x 3. Cranial nerves II through XII intact. PSYCH:  Cognitively intact    Wt Readings from Last 3 Encounters:  11/23/17 205 lb 6.4 oz (93.2 kg)  08/16/17 201 lb 12.8 oz (91.5 kg)  08/02/17 216 lb 12.8 oz (98.3 kg)      Studies/Labs Reviewed:   EKG:  EKG is not ordered today.    Recent Labs: 07/24/2017: B Natriuretic Peptide 120.1 07/31/2017: TSH 2.185 08/01/2017: ALT 79; Hemoglobin 12.0; Platelets 417 08/02/2017: BUN 17; Creatinine, Ser 0.91; Magnesium 2.0; Potassium 4.0; Sodium 138   Lipid Panel    Component Value Date/Time   CHOL 195 02/03/2016 1005   TRIG 100 02/03/2016 1005   HDL 41 02/03/2016 1005   CHOLHDL 4.8 02/03/2016 1005   VLDL 20 02/03/2016 1005   LDLCALC 134 (H) 02/03/2016 1005    Echo: 07/27/17: Study Conclusions  - Left ventricle: The cavity size was normal. Posterior wall   thickness was increased in a pattern of mild LVH. Systolic   function was normal. The estimated ejection fraction was in the   range of 60% to 65%. Wall motion was normal; there were no   regional wall motion abnormalities. - Aortic valve: Transvalvular velocity was within the normal range.   There was no stenosis. There was no regurgitation. - Mitral valve: Transvalvular velocity was within the normal range.   There was no evidence for stenosis. There  was no regurgitation. - Right ventricle: The cavity size was normal. Wall thickness was   normal. Systolic function was normal. - Tricuspid valve: There was no regurgitation.    ASSESSMENT:    1. Paroxysmal atrial fibrillation (HCC)   2. Coronary artery disease involving native coronary artery of native heart with other form of angina pectoris (HCC)   3. Hyperlipidemia LDL goal <70      PLAN:  In order of problems listed above:  1. CAD with history of NSTEMI, s/p stenting of the LCx in November 2017 with DES.  He is asymptomatic. Not on antiplatelet therapy since he is on Eliquis. History of bradycardia on beta blocker.  2. Atrial fibrillation paroxysmal. In setting of acute PNA. I suspect even recurrence was related to this. Will continue rate control with Cardizem. Continue Eliquis. Continue to abstain from Etoh.  3. Hyperlipidemia:  intolerant to Crestor. On low dose Zocer. States he had lab work with Dr. Waynard Edwards. Will get a copy.  4. Tobacco use: now quit. Congratulated.  5. HTN controlled.    I will follow up in 6 months.  Medication Adjustments/Labs and Tests Ordered: Current medicines are reviewed at length with the patient today.  Concerns regarding medicines are outlined above.  Medication changes, Labs and Tests ordered today are listed in the Patient Instructions below. Patient Instructions  Continue your current therapy  I will see you in 6 months     Signed, Delvis Kau [place], MD  11/23/2017 9:42 AM    [facility] [address], [place], [place], [address] [phone]

## 2017-11-20 ENCOUNTER — Encounter: Payer: Self-pay | Admitting: Cardiology

## 2017-11-23 ENCOUNTER — Ambulatory Visit: Payer: Medicare Other | Admitting: Cardiology

## 2017-11-23 ENCOUNTER — Encounter: Payer: Self-pay | Admitting: Cardiology

## 2017-11-23 ENCOUNTER — Encounter

## 2017-11-23 VITALS — BP 142/70 | HR 76 | Ht 73.0 in | Wt 205.4 lb

## 2017-11-23 DIAGNOSIS — I480 Paroxysmal atrial fibrillation: Secondary | ICD-10-CM | POA: Diagnosis not present

## 2017-11-23 DIAGNOSIS — E785 Hyperlipidemia, unspecified: Secondary | ICD-10-CM | POA: Diagnosis not present

## 2017-11-23 DIAGNOSIS — I25118 Atherosclerotic heart disease of native coronary artery with other forms of angina pectoris: Secondary | ICD-10-CM

## 2017-11-23 NOTE — Patient Instructions (Addendum)
Continue your current therapy  I will see you in 6 months.   

## 2017-12-04 ENCOUNTER — Ambulatory Visit: Payer: Medicare Other | Admitting: Cardiology

## 2018-05-15 ENCOUNTER — Telehealth: Payer: Self-pay

## 2018-05-15 NOTE — Telephone Encounter (Signed)
   [facility] Pre-operative Risk Assessment    Request for surgical clearance:  1. What type of surgery is being performed? SCS  2. When is this surgery scheduled? TBD   3. What type of clearance is required (medical clearance vs. Pharmacy clearance to hold med vs. Both)? Pharmacy  4. Are there any medications that need to be held prior to surgery and how long? Eliquis  5. Practice name and name of physician performing surgery? [facility]   6. What is your office phone number [phone]   7.   What is your office fax number [phone] Attn: Madlyn Frankel   8.   Anesthesia type General    Neoma Laming 05/15/2018, 6:04 PM  _________________________________________________________________   (provider comments below)

## 2018-05-16 NOTE — Telephone Encounter (Signed)
Patient with diagnosis of afib on Eliquis for anticoagulation.    Procedure: SCS Date of procedure: TBD  CHADS2-VASc score of  3 (CHF, HTN, AGE, DM2, stroke/tia x 2, CAD, AGE, male)  CrCl 77ml/min  Per office protocol, patient can hold Eliquis for 3 days prior to procedure.

## 2018-05-17 ENCOUNTER — Telehealth: Payer: Self-pay | Admitting: Cardiology

## 2018-05-17 NOTE — Telephone Encounter (Signed)
refaxed at number provided [phone] Attn: Rosalva Ferron

## 2018-05-17 NOTE — Telephone Encounter (Signed)
Follow Up:      Pt said [facility] said they did not received the clearance, they are not in Epic. Please fax clearance to them or walk it over to them please.

## 2018-05-22 ENCOUNTER — Other Ambulatory Visit: Payer: Self-pay

## 2018-05-22 ENCOUNTER — Encounter (HOSPITAL_COMMUNITY)
Admission: RE | Admit: 2018-05-22 | Discharge: 2018-05-22 | Disposition: A | Discharge status: Home / Self Care | Payer: Medicare Other | Source: Ambulatory Visit | Attending: Orthopedic Surgery | Admitting: Orthopedic Surgery

## 2018-05-22 ENCOUNTER — Encounter (HOSPITAL_COMMUNITY): Payer: Self-pay

## 2018-05-22 ENCOUNTER — Encounter (HOSPITAL_COMMUNITY): Payer: Self-pay | Admitting: Physician Assistant

## 2018-05-22 ENCOUNTER — Ambulatory Visit: Payer: Self-pay | Admitting: Orthopedic Surgery

## 2018-05-22 DIAGNOSIS — Z01812 Encounter for preprocedural laboratory examination: Secondary | ICD-10-CM | POA: Insufficient documentation

## 2018-05-22 HISTORY — DX: Pneumonia, unspecified organism: J18.9

## 2018-05-22 HISTORY — DX: Cardiac arrhythmia, unspecified: I49.9

## 2018-05-22 HISTORY — DX: Dyspnea, unspecified: R06.00

## 2018-05-22 LAB — CBC
HCT: 42.2 % (ref 39.0–52.0)
Hemoglobin: 13.7 g/dL (ref 13.0–17.0)
MCH: 30.1 pg (ref 26.0–34.0)
MCHC: 32.5 g/dL (ref 30.0–36.0)
MCV: 92.7 fL (ref 80.0–100.0)
NRBC: 0 % (ref 0.0–0.2)
Platelets: 248 10*3/uL (ref 150–400)
RBC: 4.55 MIL/uL (ref 4.22–5.81)
RDW: 12.5 % (ref 11.5–15.5)
WBC: 7.1 10*3/uL (ref 4.0–10.5)

## 2018-05-22 LAB — BASIC METABOLIC PANEL
ANION GAP: 6 (ref 5–15)
BUN: 19 mg/dL (ref 8–23)
CO2: 27 mmol/L (ref 22–32)
Calcium: 9.2 mg/dL (ref 8.9–10.3)
Chloride: 105 mmol/L (ref 98–111)
Creatinine, Ser: 0.97 mg/dL (ref 0.61–1.24)
GFR calc Af Amer: >60 mL/min (ref >60)
GFR calc non Af Amer: >60 mL/min (ref >60)
Glucose, Bld: 110 mg/dL — ABNORMAL HIGH (ref 70–99)
Potassium: 4.1 mmol/L (ref 3.5–5.1)
Sodium: 138 mmol/L (ref 135–145)

## 2018-05-22 LAB — SURGICAL PCR SCREEN
MRSA, PCR: NEGATIVE
Staphylococcus aureus: NEGATIVE

## 2018-05-22 NOTE — Pre-Procedure Instructions (Signed)
Troy Jackson  05/22/2018      [facility] - [place] ([place]), [place] - [address] [place] ([place]) [place] [address] Phone: [phone] Fax: [phone]    Your procedure is scheduled on Thursday, March 19th   Report to [facility] Admitting at  0530 AM             (posted surgery time 7:30a - 9:53a)   Call this number if you have problems the morning of surgery:  [phone]   Remember:   Do not eat any foods or drink any liquids after midnight, Wednesday.              7 days prior to surgery, STOP TAKING ANY Vitamins, Herbal Supplements, Anti-inflammatories.              Stop taking your Eliquis 3 days prior to surgery.  (so last dose will be morning of 3/16)    Take these medicines the morning of surgery with A SIP OF WATER : Xanax, Cymbalta, Metoprolol, Norco, Protonix.    Do not wear jewelry - NO RINGS  Do not wear lotions, colognes or deodorant.             Men may shave face and neck.  Do not bring valuables to the hospital.  [facility] is not responsible for any belongings or valuables.  Contacts, dentures or bridgework may not be worn into surgery.  Leave your suitcase in the car.  After surgery it may be brought to your room.  For patients admitted to the hospital, discharge time will be determined by your treatment team.  Please read over the following fact sheets that you were given. Pain Booklet, MRSA Information and Surgical Site Infection Prevention       [facility]- Preparing For Surgery  Before surgery, you can play an important role. Because skin is not sterile, your skin needs to be as free of germs as possible. You can reduce the number of germs on your skin by washing with CHG (chlorahexidine gluconate) Soap before surgery.  CHG is an antiseptic cleaner which kills germs and bonds with the skin to continue killing germs even after washing.    Oral Hygiene is also important to reduce your risk of  infection.    Remember - BRUSH YOUR TEETH THE MORNING OF SURGERY WITH YOUR REGULAR TOOTHPASTE  Please do not use if you have an allergy to CHG or antibacterial soaps. If your skin becomes reddened/irritated stop using the CHG.  Do not shave (including legs and underarms) for at least 48 hours prior to first CHG shower. It is OK to shave your face.  Please follow these instructions carefully.   1. Shower the NIGHT BEFORE SURGERY and the MORNING OF SURGERY with CHG.   2. If you chose to wash your hair, wash your hair first as usual with your normal shampoo.  3. After you shampoo, rinse your hair and body thoroughly to remove the shampoo.  4. Use CHG as you would any other liquid soap. You can apply CHG directly to the skin and wash gently with a scrungie or a clean washcloth.   5. Apply the CHG Soap to your body ONLY FROM THE NECK DOWN.  Do not use on open wounds or open sores. Avoid contact with your eyes, ears, mouth and genitals (private parts). Wash Face and genitals (private parts)  with your normal soap.  6. Wash thoroughly, paying  special attention to the area where your surgery will be performed.  7. Thoroughly rinse your body with warm water from the neck down.  8. DO NOT shower/wash with your normal soap after using and rinsing off the CHG Soap.  9. Pat yourself dry with a CLEAN TOWEL.  10. Wear CLEAN PAJAMAS to bed the night before surgery, wear comfortable clothes the morning of surgery  11. Place CLEAN SHEETS on your bed the night of your first shower and DO NOT SLEEP WITH PETS.  Day of Surgery:  Do not apply any deodorants/lotions.  Please wear clean clothes to the hospital/surgery center.   Remember to brush your teeth WITH YOUR REGULAR TOOTHPASTE.  PLEASE RE-READ OVER THIS INFORMATION

## 2018-05-22 NOTE — Progress Notes (Signed)
PCP - Dr. Loraine Leriche Perrini  LOV 02/2018  Cardiologist - Dr. P Swaziland  LOV 03/2018    Chest x-ray -   EKG -  07/2017 Stress Test -  ECHO - 07/2017 Cardiac Cath - 01/2016  Sleep Study - denies CPAP -   Fasting Blood Sugar -  Checks Blood Sugar _____ times a day  Blood Thinner Instructions: Patient has been on eliquis.  His cardio, Dr. Swaziland (note inside chart) Has instructed him to stop it 3 days prior to.  Dr. Shon Baton has instructed 5 days.  Patient and wife are going to see Dr. Shon Baton and I asked them to verifiy and confirm.  Aspirin Instructions:  Anesthesia review: yes  Patient denies shortness of breath, fever, cough and chest pain at PAT appointment   Patient verbalized understanding of instructions that were given to them at the PAT appointment. Patient was also instructed that they will need to review over the PAT instructions again at home before surgery.

## 2018-05-22 NOTE — Progress Notes (Signed)
   05/22/18 1106  OBSTRUCTIVE SLEEP APNEA  Have you ever been diagnosed with sleep apnea through a sleep study? No  Do you snore loudly (loud enough to be heard through closed doors)?  1  Do you often feel tired, fatigued, or sleepy during the daytime (such as falling asleep during driving or talking to someone)? 0  Has anyone observed you stop breathing during your sleep? 0  Do you have, or are you being treated for high blood pressure? 1  BMI more than 35 kg/m2? 0  Age > 50 (1-yes) 1  Neck circumference greater than:Male 16 inches or larger, Male 17inches or larger? 0  Male Gender (Yes=1) 1  Obstructive Sleep Apnea Score 4

## 2018-05-22 NOTE — H&P (Signed)
Subjective:   How are you feeling? pain level 5-6/10 improved with SCS trial Previous Injections: did not help Medications: pain meds from Dr Ethelene Hal Notes: Troy Jackson is a pleasant 73 year old male with past medical history of A. fib (on Eliquis), and chronic low back pain (in pain management with Dr. Modesta Messing on hydrocodone), Who presents office today status post a successful spinal cord stimulator trial with Dr. Ethelene Hal. He describes his pain as low back, that radiates to the posterior lateral thighs he denies any leg weakness. 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 discuss SCS placement T-spine MRI @ [facility] 05-07-18 Patient Active Problem List   Diagnosis Date Noted  . Atrial fibrillation with RVR (HCC) 07/27/2017  . Sepsis due to pneumonia (HCC) 07/24/2017  . CAD (coronary artery disease) 07/24/2017  . NSTEMI (non-ST elevated myocardial infarction) (HCC) 02/11/2016  . Chest pain 02/03/2016  . COPD (chronic obstructive pulmonary disease) (HCC) 02/03/2016  . Unstable angina (HCC)   . Hyperlipidemia 01/31/2011  . Tobacco abuse 01/31/2011   Past Medical History:  Diagnosis Date  . Anxiety   . COPD (chronic obstructive pulmonary disease) (HCC)   . Coronary artery disease 2017   stent  . Diverticulitis   . Dyspnea    doe  . Dysrhythmia   . GERD (gastroesophageal reflux disease)   . Hyperlipidemia   . Pneumonia    2019  . Tobacco abuse 2017    Past Surgical History:  Procedure Laterality Date  . CARDIAC CATHETERIZATION N/A 02/03/2016   Procedure: Left Heart Cath and Coronary Angiography;  Surgeon: Peter M Swaziland, MD;  Location[facility] INVASIVE CV LAB;  Service: Cardiovascular;  Laterality: N/A;  . CARDIAC CATHETERIZATION N/A 02/03/2016   Procedure: Coronary Stent Intervention;  Surgeon: Peter M Swaziland, MD;  Location: [facility] INVASIVE CV LAB;  Service:  Cardiovascular;  Laterality: N/A;  . CORONARY ANGIOPLASTY WITH STENT PLACEMENT  02/03/2016  . SHOULDER SURGERY Right    as young adult  . SHOULDER SURGERY Left    as child    Current Outpatient Medications  Medication Sig Dispense Refill Last Dose  . albuterol (PROVENTIL HFA;VENTOLIN HFA) 108 (90 Base) MCG/ACT inhaler Inhale 1-2 puffs into the lungs every 6 (six) hours as needed for wheezing or shortness of breath.   Taking  . ALPRAZolam (XANAX) 0.5 MG tablet Take 0.5-1 mg by mouth 3 (three) times daily as needed for anxiety. For anxiety   Taking  . apixaban (ELIQUIS) 5 MG TABS tablet Take 1 tablet (5 mg total) by mouth 2 (two) times daily. 60 tablet 0 Taking  . budesonide-formoterol (SYMBICORT) 80-4.5 MCG/ACT inhaler Inhale 2 puffs into the lungs 2 (two) times daily.    Taking  . dicyclomine (BENTYL) 10 MG capsule Take 10 mg by mouth 3 (three) times daily as needed for spasms.     Marland Kitchen diltiazem (CARDIZEM CD) 240 MG 24 hr capsule Take 1 capsule (240 mg total) by mouth daily. 30 capsule 1 Taking  . DULoxetine (CYMBALTA) 60 MG capsule Take 60 mg by mouth every morning.     . ezetimibe (ZETIA) 10 MG tablet Take 10 mg by mouth daily.     Marland Kitchen HYDROcodone-acetaminophen (NORCO) 10-325 MG tablet Take 1 tablet by mouth every 8 (eight) hours as needed (pain).     . metoprolol tartrate (LOPRESSOR) 25 MG tablet Take 0.5 tablets (12.5 mg total) by mouth 2 (two)  times daily as needed (as needed fOR HR> 120/min.). 60 tablet 0 Taking  . nitroGLYCERIN (NITROSTAT) 0.4 MG SL tablet Place 1 tablet (0.4 mg total) under the tongue every 5 (five) minutes as needed for chest pain. 30 tablet 2 Taking  . pantoprazole (PROTONIX) 40 MG tablet Take 1 tablet (40 mg total) by mouth daily. 30 tablet 0 Taking  . simvastatin (ZOCOR) 20 MG tablet Take 0.5 tablets (10 mg total) by mouth daily at 6 PM. (Patient taking differently: Take 20 mg by mouth daily at 6 PM. )   Taking  . valsartan (DIOVAN) 80 MG tablet Take 80 mg by mouth at  bedtime.      No current facility-administered medications for this visit.    Allergies  Allergen Reactions  . Crestor [Rosuvastatin Calcium] Other (See Comments)    All statins: Myalgias     Social History   Tobacco Use  . Smoking status: Former Smoker    Packs/day: 1.00    Years: 60.00    Pack years: 60.00    Types: Cigarettes    Last attempt to quit: 2017    Years since quitting: 3.1  . Smokeless tobacco: Never Used  Substance Use Topics  . Alcohol use: Not Currently    Alcohol/week: 20.0 standard drinks    Types: 20 Cans of beer per week    Comment: cannot remember when his last day was without a drink    Family History  Problem Relation Age of Onset  . Heart disease Father   . Lung cancer Father   . Heart attack Father   . Alcohol abuse Father   . Alzheimer's disease Mother   . Heart attack Paternal Grandfather   . Alcohol abuse Paternal Grandfather   . Alcohol abuse Brother     Review of Systems As stated in the HPI  Objective:   Vitals:  Ht: 6 ft 1 in 05/15/2018 10:13 am  Wt: 191 lbs 05/15/2018 10:15 am  BMI: 25.2 05/15/2018 10:15 am  General: AAOX3, well developed and well nourished, NAD Ambulation: normal gait pattern, uses no assistive device. Inspection: No obvious deformity, scoleosis, kyphosis, loss of lordotic curve.  Palpation: Non-tender over spinous processes and paraspinal muscles.  AROM: - Knee: flexion and extension normal and pain free bilaterally. - Ankle: Dorsiflexion, plantarflexion, inversion, eversion normal and pain free.  Dermatomes: Lower extremity sensation to light touch intact bilaterally  Myotomes: - Hip Flexion: Left 5/5, Right 5/5 -Hip Adduction: Left 5/5, Right 5/5 - Knee Extension: Left 5/5, Right 5/5 - Knee Flexion: Left 5/5, Right 5/5 - Ankle Dorsiflextion: Left 5/5, Right 5/5 - Ankle Eversion: Left 5/5, Right 5/5 - Ankle Plantarflexion: Left 5/5, Right 5/5  Reflexes: - Patella: Left2+, Right 2+ - Achilles:  Left2+, Right 2+ - Babinski: Left Ngative, Right Negative - Clonus: Negative   PV: Extremities warm and well profused. Posterior and dorsalis pedis pulse 2+ bilaterally, No pitting Edema, discoloration, calf tenderness, or palpable cords. Homan's negative bilaterally.  MRI Impression: MRI dated 05/07/2018 shows no significant stenosis, no contraindication to implantation of the spinal cord stimulator paddles.  Assessment:    Chaz is a pleasant 73 year old male with past medical history of A. fib (on Eliquis), and chronic low back pain (in pain management on hydrocodone) Who had a successful spinal cord stimulator trial to treat his chronic low back pain and associated radicular posterior lateral thigh pain. Plan:     Move forward with spinal cord stimulator implantation  Risks and benefits of  surgery were discussed with the patient. These include: Infection, bleeding, death, stroke, paralysis, ongoing or worse pain, need for additional surgery, leak of spinal fluid, Failure of the battery requiring reoperation. Inability to place the paddle requiring the surgery to be aborted. Migration of the lead, failure to obtain results similar to the trial.  Patient and family made aware that the goal of surgery is to reduce (not eliminate) pain, and improve her quality-of-life  All of patient's questions were invited and answered  Patient was provided surgical clearance forms for both his primary care physician, and his cardiologist. Patient and family made aware that the patient will have to come off of Eliquis for 7 days (5 days prior to surgery and 2 days after surgery)  Follow-up: Patient will follow up 2 weeks status post surgery.

## 2018-05-23 NOTE — Anesthesia Preprocedure Evaluation (Deleted)
Anesthesia Evaluation    Airway        Dental   Pulmonary former smoker,           Cardiovascular      Neuro/Psych    GI/Hepatic   Endo/Other    Renal/GU      Musculoskeletal   Abdominal   Peds  Hematology   Anesthesia Other Findings   Reproductive/Obstetrics                             Anesthesia Physical Anesthesia Plan  ASA:   Anesthesia Plan:    Post-op Pain Management:    Induction:   PONV Risk Score and Plan:   Airway Management Planned:   Additional Equipment:   Intra-op Plan:   Post-operative Plan:   Informed Consent:   Plan Discussed with:   Anesthesia Plan Comments: (Follows with cardiology for CAD with history of pAfib and NSTEMI s/p stenting of the LCx in November 2017 with DES.  Last seen by Dr. Swaziland 11/23/17 and he was asymptomatic at that time. On 05/15/18 he was cleared to hold Eliquis 3d preop.  Medical clearance from PCP on pt chart dated 05/17/2018.  Echo 07/27/2017 showed EF 60-65%, normal wall motion, normal valves. )        Anesthesia Quick Evaluation

## 2018-05-25 ENCOUNTER — Telehealth: Payer: Self-pay

## 2018-05-25 NOTE — Telephone Encounter (Signed)
   [facility] Pre-operative Risk Assessment    Request for surgical clearance:  1. What type of surgery is being performed? Spinal Cord Stimulator placement  2. When is this surgery scheduled? TBD  3. What type of clearance is required (medical clearance vs. Pharmacy clearance to hold med vs. Both)? Both  4. Are there any medications that need to be held prior to surgery and how long? Eliquis  5. Practice name and name of physician performing surgery? [facility]  Dr.Brooks  6. What is your office phone number [phone]   7.   What is your office fax number  [phone]  8.   Anesthesia type General   Neoma Laming 05/25/2018, 5:50 PM  _________________________________________________________________   (provider comments below)

## 2018-05-28 NOTE — Telephone Encounter (Signed)
Pharmacy addressed clearance in separate clearance note dated 3/3, this was routed back to pre op pool on 3/4.

## 2018-05-28 NOTE — Telephone Encounter (Signed)
   Primary Cardiologist: Peter Swaziland, MD  Chart reviewed as part of pre-operative protocol coverage. Patient was contacted 05/28/2018 in reference to pre-operative risk assessment for pending surgery as outlined below.  ELDRICK YAHNKE was last seen on 11/23/2017 by Dr. Swaziland.  Since that day, ZAYLOR JAQUISH has done well without any chest pain or shortness of breath.  Therefore, based on ACC/AHA guidelines, the patient would be at acceptable risk for the planned procedure without further cardiovascular testing.   I will route this recommendation to the requesting party via Epic fax function and remove from pre-op pool.  Please call with questions. He has been hold the eliquis since yesterday which should be more than 3 days prior to the procedure.   [place], [place] 05/28/2018, 4:49 PM

## 2018-05-28 NOTE — Telephone Encounter (Signed)
Hand delivered the clearance note to [facility] to Dr. Shon Baton nurse

## 2018-05-28 NOTE — Telephone Encounter (Signed)
Clinical pharmacist to review, it appears the surgery is scheduled for 3/19

## 2018-05-31 ENCOUNTER — Ambulatory Visit (HOSPITAL_COMMUNITY): Admission: RE | Admit: 2018-05-31 | Payer: Medicare Other | Source: Home / Self Care | Admitting: Orthopedic Surgery

## 2018-05-31 ENCOUNTER — Encounter (HOSPITAL_COMMUNITY): Admission: RE | Payer: Self-pay | Source: Home / Self Care

## 2018-05-31 SURGERY — INSERTION, SPINAL CORD STIMULATOR, LUMBAR
Anesthesia: General

## 2018-06-04 ENCOUNTER — Ambulatory Visit: Payer: Medicare Other | Admitting: Cardiology

## 2018-07-17 ENCOUNTER — Ambulatory Visit: Payer: Self-pay | Admitting: Orthopedic Surgery

## 2018-07-23 ENCOUNTER — Encounter (HOSPITAL_COMMUNITY)
Admission: RE | Admit: 2018-07-23 | Discharge: 2018-07-23 | Disposition: A | Discharge status: Home / Self Care | Payer: Medicare Other | Source: Ambulatory Visit | Attending: Orthopedic Surgery | Admitting: Orthopedic Surgery

## 2018-07-23 ENCOUNTER — Ambulatory Visit (HOSPITAL_COMMUNITY)
Admission: RE | Admit: 2018-07-23 | Discharge: 2018-07-23 | Disposition: A | Discharge status: Home / Self Care | Payer: Medicare Other | Source: Ambulatory Visit | Attending: Orthopedic Surgery | Admitting: Orthopedic Surgery

## 2018-07-23 ENCOUNTER — Other Ambulatory Visit: Payer: Self-pay

## 2018-07-23 ENCOUNTER — Encounter (HOSPITAL_COMMUNITY): Payer: Self-pay

## 2018-07-23 DIAGNOSIS — Z1159 Encounter for screening for other viral diseases: Secondary | ICD-10-CM | POA: Insufficient documentation

## 2018-07-23 DIAGNOSIS — Z01818 Encounter for other preprocedural examination: Secondary | ICD-10-CM | POA: Insufficient documentation

## 2018-07-23 DIAGNOSIS — Z01812 Encounter for preprocedural laboratory examination: Secondary | ICD-10-CM | POA: Insufficient documentation

## 2018-07-23 HISTORY — DX: Essential (primary) hypertension: I10

## 2018-07-23 LAB — URINALYSIS, ROUTINE W REFLEX MICROSCOPIC
Bilirubin Urine: NEGATIVE
Glucose, UA: NEGATIVE mg/dL
Hgb urine dipstick: NEGATIVE
Ketones, ur: NEGATIVE mg/dL
Leukocytes,Ua: NEGATIVE
Nitrite: NEGATIVE
Protein, ur: NEGATIVE mg/dL
Specific Gravity, Urine: 1.026 (ref 1.005–1.030)
pH: 5 (ref 5.0–8.0)

## 2018-07-23 LAB — CBC
HCT: 43.1 % (ref 39.0–52.0)
Hemoglobin: 13.9 g/dL (ref 13.0–17.0)
MCH: 30.3 pg (ref 26.0–34.0)
MCHC: 32.3 g/dL (ref 30.0–36.0)
MCV: 94.1 fL (ref 80.0–100.0)
Platelets: 261 10*3/uL (ref 150–400)
RBC: 4.58 MIL/uL (ref 4.22–5.81)
RDW: 12.6 % (ref 11.5–15.5)
WBC: 6.8 10*3/uL (ref 4.0–10.5)
nRBC: 0 % (ref 0.0–0.2)

## 2018-07-23 LAB — BASIC METABOLIC PANEL
Anion gap: 11 (ref 5–15)
BUN: 24 mg/dL — ABNORMAL HIGH (ref 8–23)
CO2: 28 mmol/L (ref 22–32)
Calcium: 9.4 mg/dL (ref 8.9–10.3)
Chloride: 98 mmol/L (ref 98–111)
Creatinine, Ser: 1.16 mg/dL (ref 0.61–1.24)
GFR calc Af Amer: >60 mL/min (ref >60)
GFR calc non Af Amer: >60 mL/min (ref >60)
Glucose, Bld: 123 mg/dL — ABNORMAL HIGH (ref 70–99)
Potassium: 4.3 mmol/L (ref 3.5–5.1)
Sodium: 137 mmol/L (ref 135–145)

## 2018-07-23 NOTE — Pre-Procedure Instructions (Signed)
Troy Jackson  07/23/2018    Your procedure is scheduled on Wednesday, Jul 25, 2018 at 12:30 PM.   Report to [facility] Entrance "A" Admitting Office at 10:30 AM.   Call this number if you have problems the morning of surgery: [phone]   Questions prior to day of surgery, please call [phone] between 8 & 4 PM.   Remember:  Do not eat or drink after midnight Tuesday, 07/24/18.  Take these medicines the morning of surgery with A SIP OF WATER: Alprazolam (Xanax), Diltiazem (Cardizem), Duloxetine (Cymbalta), Metoprolol (Lopressor) - if needed, Hydrocodone or Tylenol - if needed,  Albuterol nebulizer, Symbicort inhaler,  Albuterol inhaler - if needed (bring inhaler with you day of surgery)  Stop Eliquis as instructed by your cardiologist.     Do not wear jewelry.  Do not wear lotions, powders, cologne or deodorant.  Men may shave face and neck.  Do not bring valuables to the hospital.  [facility] is not responsible for any belongings or valuables.  Contacts, dentures or bridgework may not be worn into surgery.  Leave your suitcase in the car.  After surgery it may be brought to your room.  For patients admitted to the hospital, discharge time will be determined by your treatment team.  Patients discharged the day of surgery will not be allowed to drive home.   [facility] - Preparing for Surgery  Before surgery, you can play an important role.  Because skin is not sterile, your skin needs to be as free of germs as possible.  You can reduce the number of germs on you skin by washing with CHG (chlorahexidine gluconate) soap before surgery.  CHG is an antiseptic cleaner which kills germs and bonds with the skin to continue killing germs even after washing.  Oral Hygiene is also important in reducing the risk of infection.  Remember to brush your teeth with your regular toothpaste the morning of surgery.  Please DO NOT use if you have an allergy to CHG or antibacterial  soaps.  If your skin becomes reddened/irritated stop using the CHG and inform your nurse when you arrive at Short Stay.  Do not shave (including legs and underarms) for at least 48 hours prior to the first CHG shower.  You may shave your face.  Please follow these instructions carefully:   1.  Shower with CHG Soap the night before surgery and the morning of Surgery.  2.  If you choose to wash your hair, wash your hair first as usual with your normal shampoo.  3.  After you shampoo, rinse your hair and body thoroughly to remove the shampoo. 4.  Use CHG as you would any other liquid soap.  You can apply chg directly to the skin and wash gently with a      scrungie or washcloth.           5.  Apply the CHG Soap to your body ONLY FROM THE NECK DOWN.   Do not use on open wounds or open sores. Avoid contact with your eyes, ears, mouth and genitals (private parts).  Wash genitals (private parts) with your normal soap.  6.  Wash thoroughly, paying special attention to the area where your surgery will be performed.  7.  Thoroughly rinse your body with warm water from the neck down.  8.  DO NOT shower/wash with your normal soap after using and rinsing off the CHG Soap.  9.  Pat yourself dry with  a clean towel.            10.  Wear clean pajamas.            11.  Place clean sheets on your bed the night of your first shower and do not sleep with pets.  Day of Surgery  Shower as above. Do not apply any lotions/deodorants the morning of surgery.   Please wear clean clothes to the hospital. Remember to brush your teeth with toothpaste.   Please read over the fact sheets that you were given.

## 2018-07-23 NOTE — Progress Notes (Signed)
PCP - Dr. Rodrigo Ran Cardiologist - Dr. Peter Swaziland   Chest x-ray - today EKG - today Stress Test - n/a ECHO - 07/27/17 Cardiac Cath - 02/03/16  Sleep Study - n/1 CPAP -   Fasting Blood Sugar - n/a Checks Blood Sugar _____ times a day  Blood Thinner Instructions: Stopped Eliquis 07/20/18 Aspirin Instructions: n/a  Anesthesia review: yes - Dr. Shon Baton' patient  Patient denies shortness of breath, fever, cough and chest pain at PAT appointment   Patient verbalized understanding of instructions that were given to them at the PAT appointment. Patient was also instructed that they will need to review over the PAT instructions again at home before surgery.  Pt has appt today, 07/23/18 for Covid 19 testing at 12:15 PM.   Coronavirus Screening  Have you experienced the following symptoms:  Cough NO Fever (>100.87F)  NO Runny nose NO  Sore throat NO Difficulty breathing/shortness of breath  NO  Have you or a family member traveled in the last 14 days and where? NO   If the patient indicates "YES" to the above questions, their PAT will be rescheduled to limit the exposure to others and, the surgeon will be notified. THE PATIENT WILL NEED TO BE ASYMPTOMATIC FOR 14 DAYS.   If the patient is not experiencing any of these symptoms, the PAT nurse will instruct them to NOT bring anyone with them to their appointment since they may have these symptoms or traveled as well.   Pt reminded that hospital visitation restrictions are in effect and the importance of the restrictions.

## 2018-07-24 ENCOUNTER — Ambulatory Visit: Payer: Self-pay | Admitting: Orthopedic Surgery

## 2018-07-24 LAB — NOVEL CORONAVIRUS, NAA (HOSP ORDER, SEND-OUT TO REF LAB; TAT 18-24 HRS): SARS-CoV-2, NAA: NOT DETECTED

## 2018-07-24 MED ORDER — TRANEXAMIC ACID-NACL 1000-0.7 MG/100ML-% IV SOLN
1000.0000 mg | INTRAVENOUS | Status: DC
  Filled 2018-07-24: qty 100

## 2018-07-24 MED ORDER — CEFAZOLIN SODIUM-DEXTROSE 2-4 GM/100ML-% IV SOLN
2.0000 g | INTRAVENOUS | Status: DC
  Filled 2018-07-24: qty 100

## 2018-07-24 NOTE — Anesthesia Preprocedure Evaluation (Addendum)
Anesthesia Evaluation  Patient identified by MRN, date of birth, ID band Patient awake    Reviewed: Allergy & Precautions, H&P , NPO status , Patient's Chart, lab work & pertinent test results, reviewed documented beta blocker date and time   Airway Mallampati: II  TM Distance: >3 FB Neck ROM: full    Dental no notable dental hx.    Pulmonary COPD,  COPD inhaler, former smoker,    Pulmonary exam normal breath sounds clear to auscultation       Cardiovascular Exercise Tolerance: Good hypertension, Pt. on medications + angina + CAD and + Past MI  + dysrhythmias  Rhythm:regular Rate:Normal  Cath 11/17 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 Normal LV function 4. Mildly elevated LVEDP 5. Successful stenting of the mid LCx with DES   Neuro/Psych PSYCHIATRIC DISORDERS Anxiety negative neurological ROS     GI/Hepatic Neg liver ROS, GERD  ,  Endo/Other  negative endocrine ROS  Renal/GU negative Renal ROS  negative genitourinary   Musculoskeletal   Abdominal   Peds  Hematology negative hematology ROS (+)   Anesthesia Other Findings   Reproductive/Obstetrics negative OB ROS                           Anesthesia Physical Anesthesia Plan  ASA: III  Anesthesia Plan: General   Post-op Pain Management:    Induction: Intravenous  PONV Risk Score and Plan: 2 and Ondansetron and Treatment may vary due to age or medical condition  Airway Management Planned: Oral ETT and LMA  Additional Equipment:   Intra-op Plan:   Post-operative Plan: Extubation in OR  Informed Consent: I have reviewed the patients History and Physical, chart, labs and discussed the procedure including the risks, benefits and alternatives for the proposed anesthesia with the patient or authorized representative who has  indicated his/her understanding and acceptance.     Dental Advisory Given  Plan Discussed with: CRNA  Anesthesia Plan Comments: (Follows with cardiology for CAD with history of pAfib and NSTEMI s/p stenting of the LCx in November 2017 with DES.  Last seen by Dr. Swaziland 11/23/17 and he was asymptomatic at that time. Cardiac clearance in telephone encounter 05/28/18.  Medical clearance from PCP 05/17/2018.  Echo 07/27/2017 showed EF 60-65%, normal wall motion, normal valves)       Anesthesia Quick Evaluation

## 2018-07-24 NOTE — H&P (Signed)
Subjective:   Troy Jackson is a pleasant 73 year old male with past medical history of A. fib (on Eliquis), CAD, COPD and chronic low back pain (in pain management with Dr. Modesta Messingramus on hydrocodone), Who has had a successful spinal cord stimulator trial with Dr. Ethelene Halramos. He describes his pain as low back, that radiates to the posterior lateral thighs he denies any leg weakness. 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 He has obtained clearance from PCP and cardiologist.   Patient Active Problem List   Diagnosis Date Noted  . Atrial fibrillation with RVR (HCC) 07/27/2017  . Sepsis due to pneumonia (HCC) 07/24/2017  . CAD (coronary artery disease) 07/24/2017  . NSTEMI (non-ST elevated myocardial infarction) (HCC) 02/11/2016  . Chest pain 02/03/2016  . COPD (chronic obstructive pulmonary disease) (HCC) 02/03/2016  . Unstable angina (HCC)   . Hyperlipidemia 01/31/2011  . Tobacco abuse 01/31/2011   Past Medical History:  Diagnosis Date  . Anxiety   . COPD (chronic obstructive pulmonary disease) (HCC)   . Coronary artery disease 2017   stent  . Diverticulitis   . Dyspnea    doe, with exertion  . Dysrhythmia    atrial fibrillation  . GERD (gastroesophageal reflux disease)   . Hyperlipidemia   . Hypertension   . Pneumonia    2019  . Tobacco abuse 2017    Past Surgical History:  Procedure Laterality Date  . CARDIAC CATHETERIZATION N/A 02/03/2016   Procedure: Left Heart Cath and Coronary Angiography;  Surgeon: Peter M SwazilandJordan, MD;  [facility];  Service: Cardiovascular;  Laterality: N/A;  . CARDIAC CATHETERIZATION N/A 02/03/2016   Procedure: Coronary Stent Intervention;  Surgeon: Peter M SwazilandJordan, MD;  [facility];  Service:  Cardiovascular;  Laterality: N/A;  . COLONOSCOPY    . CORONARY ANGIOPLASTY WITH STENT PLACEMENT  02/03/2016  . SHOULDER SURGERY Right    as young adult  . SHOULDER SURGERY Left    as child    Current Outpatient Medications  Medication Sig Dispense Refill Last Dose  . acetaminophen (TYLENOL) 500 MG tablet Take 1,000 mg by mouth every 6 (six) hours as needed for moderate pain or headache.     . albuterol (PROVENTIL HFA;VENTOLIN HFA) 108 (90 Base) MCG/ACT inhaler Inhale 2 puffs into the lungs every 6 (six) hours as needed for wheezing or shortness of breath.    Taking  . albuterol (PROVENTIL) (2.5 MG/3ML) 0.083% nebulizer solution Take 2.5 mg by nebulization 2 (two) times a day.     . ALPRAZolam (XANAX) 0.5 MG tablet Take 0.5 mg by mouth 3 (three) times daily.    Taking  . apixaban (ELIQUIS) 5 MG TABS tablet Take 1 tablet (5 mg total) by mouth 2 (two) times daily. 60 tablet 0 07/20/2018  . budesonide-formoterol (SYMBICORT) 80-4.5 MCG/ACT inhaler Inhale 2 puffs into the lungs 2 (two) times daily.    Taking  . dicyclomine (BENTYL) 10 MG capsule Take 10 mg by mouth every 8 (eight) hours as needed for spasms.      Marland Kitchen. diltiazem (CARDIZEM CD) 240 MG 24 hr capsule Take 1 capsule (240 mg total) by mouth daily. 30 capsule 1 Taking  . DULoxetine (CYMBALTA) 60 MG capsule Take 60 mg by mouth 2 (two) times daily.      .Marland Kitchen  ezetimibe (ZETIA) 10 MG tablet Take 10 mg by mouth daily.     Marland Kitchen HYDROcodone-acetaminophen (NORCO) 10-325 MG tablet Take 1 tablet by mouth every 8 (eight) hours as needed (pain).     . Menthol, Topical Analgesic, (ZIMS MAX-FREEZE EX) Apply 1 application topically daily as needed (muscle pain).     . metoprolol tartrate (LOPRESSOR) 25 MG tablet Take 0.5 tablets (12.5 mg total) by mouth 2 (two) times daily as needed (as needed fOR HR> 120/min.). (Patient taking differently: Take 25 mg by mouth 2 (two) times daily as needed (as needed fOR HR> 120/min.). ) 60 tablet 0 Taking  . nitroGLYCERIN  (NITROSTAT) 0.4 MG SL tablet Place 1 tablet (0.4 mg total) under the tongue every 5 (five) minutes as needed for chest pain. 30 tablet 2 Taking  . pantoprazole (PROTONIX) 40 MG tablet Take 1 tablet (40 mg total) by mouth daily. (Patient taking differently: Take 40 mg by mouth at bedtime. ) 30 tablet 0 Taking  . simvastatin (ZOCOR) 20 MG tablet Take 0.5 tablets (10 mg total) by mouth daily at 6 PM. (Patient taking differently: Take 20 mg by mouth at bedtime. )   Taking  . valsartan (DIOVAN) 80 MG tablet Take 80 mg by mouth at bedtime.      No current facility-administered medications for this visit.    Facility-Administered Medications Ordered in Other Visits  Medication Dose Route Frequency Provider Last Rate Last Dose  . [START ON 07/25/2018] ceFAZolin (ANCEF) IVPB 2g/100 mL premix  2 g Intravenous To Francina Ames, Debria Garret, MD      . Melene Muller ON 07/25/2018] tranexamic acid (CYKLOKAPRON) IVPB 1,000 mg  1,000 mg Intravenous To OR Venita Lick, MD       Allergies  Allergen Reactions  . Statins Other (See Comments)    "All statins: Myalgias"    Social History   Tobacco Use  . Smoking status: Former Smoker    Packs/day: 1.00    Years: 60.00    Pack years: 60.00    Types: Cigarettes    Last attempt to quit: 2017    Years since quitting: 3.3  . Smokeless tobacco: Never Used  Substance Use Topics  . Alcohol use: Not Currently    Alcohol/week: 20.0 standard drinks    Types: 20 Cans of beer per week    Comment: cannot remember when his last day was without a drink    Family History  Problem Relation Age of Onset  . Heart disease Father   . Lung cancer Father   . Heart attack Father   . Alcohol abuse Father   . Alzheimer's disease Mother   . Heart attack Paternal Grandfather   . Alcohol abuse Paternal Grandfather   . Alcohol abuse Brother     Review of Systems As stated in HPI  Objective:   General: AAOX3, well developed and well nourished, NAD Ambulation: normal gait pattern,  uses no assistive device. Inspection: No obvious deformity, scoleosis, kyphosis, loss of lordotic curve.  Palpation: Non-tender over spinous processes and paraspinal muscles.  AROM: - Knee: flexion and extension normal and pain free bilaterally. - Ankle: Dorsiflexion, plantarflexion, inversion, eversion normal and pain free.   Dermatomes: Lower extremity sensation to light touch intact bilaterally  Myotomes:  - Hip Flexion: Left 5/5, Right 5/5 -Hip Adduction: Left 5/5, Right 5/5 - Knee Extension: Left 5/5, Right 5/5 - Knee Flexion: Left 5/5, Right 5/5 - Ankle Dorsiflextion: Left 5/5, Right 5/5 - Ankle Eversion: Left 5/5, Right 5/5 -  Ankle Plantarflexion: Left 5/5, Right 5/5  Reflexes:  - Patella: Left2+, Right 2+ - Achilles: Left2+, Right 2+ - Babinski: Left Ngative, Right Negative - Clonus: Negative   PV: Extremities warm and well profused. Posterior and dorsalis pedis pulse 2+ bilaterally, No pitting Edema, discoloration, calf tenderness, or palpable cords. Homan's negative bilaterally.   MRI Impression: MRI dated 05/07/2018 shows no significant stenosis, no contraindication to implantation of the spinal cord stimulator paddles.  Assessment:   Troy Jackson is a pleasant 73 year old male with past medical history of A. fib (on Eliquis), and chronic low back pain (in pain management on hydrocodone) Who had a successful spinal cord stimulator trial to treat his chronic low back pain and associated radicular posterior lateral thigh pain.  Plan:   Move forward with spinal cord stimulator implantation  Risks and benefits of surgery were discussed with the patient. These include: Infection, bleeding, death, stroke, paralysis, ongoing or worse pain, need for additional surgery, leak of spinal fluid, Failure of the battery requiring reoperation. Inability to place the paddle requiring the surgery to be aborted. Migration of the lead, failure to obtain results similar to the  trial.  Patient and family made aware that the goal of surgery is to reduce (not eliminate) pain, and improve her quality-of-life  All of patient's questions were invited and answered  Follow-up: Patient will follow up 2 weeks status post surgery.

## 2018-07-25 ENCOUNTER — Other Ambulatory Visit: Payer: Self-pay

## 2018-07-25 ENCOUNTER — Ambulatory Visit (HOSPITAL_COMMUNITY): Payer: Medicare Other

## 2018-07-25 ENCOUNTER — Observation Stay (HOSPITAL_COMMUNITY)
Admission: RE | Admit: 2018-07-25 | Discharge: 2018-07-26 | Disposition: A | Discharge status: Home / Self Care | Payer: Medicare Other | Attending: Orthopedic Surgery | Admitting: Orthopedic Surgery

## 2018-07-25 ENCOUNTER — Ambulatory Visit (HOSPITAL_COMMUNITY): Payer: Medicare Other | Admitting: Anesthesiology

## 2018-07-25 ENCOUNTER — Encounter (HOSPITAL_COMMUNITY): Payer: Self-pay

## 2018-07-25 ENCOUNTER — Ambulatory Visit (HOSPITAL_COMMUNITY): Payer: Medicare Other | Admitting: Physician Assistant

## 2018-07-25 ENCOUNTER — Encounter (HOSPITAL_COMMUNITY)
Admission: RE | Disposition: A | Discharge status: Home / Self Care | Payer: Self-pay | Source: Home / Self Care | Attending: Orthopedic Surgery

## 2018-07-25 DIAGNOSIS — J449 Chronic obstructive pulmonary disease, unspecified: Secondary | ICD-10-CM | POA: Insufficient documentation

## 2018-07-25 DIAGNOSIS — Z419 Encounter for procedure for purposes other than remedying health state, unspecified: Secondary | ICD-10-CM

## 2018-07-25 DIAGNOSIS — Z888 Allergy status to other drugs, medicaments and biological substances status: Secondary | ICD-10-CM | POA: Insufficient documentation

## 2018-07-25 DIAGNOSIS — Z7901 Long term (current) use of anticoagulants: Secondary | ICD-10-CM | POA: Insufficient documentation

## 2018-07-25 DIAGNOSIS — Z79899 Other long term (current) drug therapy: Secondary | ICD-10-CM | POA: Diagnosis not present

## 2018-07-25 DIAGNOSIS — K219 Gastro-esophageal reflux disease without esophagitis: Secondary | ICD-10-CM | POA: Diagnosis not present

## 2018-07-25 DIAGNOSIS — G894 Chronic pain syndrome: Principal | ICD-10-CM | POA: Insufficient documentation

## 2018-07-25 DIAGNOSIS — E785 Hyperlipidemia, unspecified: Secondary | ICD-10-CM | POA: Diagnosis not present

## 2018-07-25 DIAGNOSIS — M545 Low back pain: Secondary | ICD-10-CM | POA: Insufficient documentation

## 2018-07-25 DIAGNOSIS — G629 Polyneuropathy, unspecified: Secondary | ICD-10-CM | POA: Insufficient documentation

## 2018-07-25 DIAGNOSIS — I2510 Atherosclerotic heart disease of native coronary artery without angina pectoris: Secondary | ICD-10-CM | POA: Diagnosis not present

## 2018-07-25 DIAGNOSIS — F419 Anxiety disorder, unspecified: Secondary | ICD-10-CM | POA: Insufficient documentation

## 2018-07-25 DIAGNOSIS — I252 Old myocardial infarction: Secondary | ICD-10-CM | POA: Insufficient documentation

## 2018-07-25 DIAGNOSIS — G8929 Other chronic pain: Secondary | ICD-10-CM | POA: Diagnosis present

## 2018-07-25 DIAGNOSIS — Z87891 Personal history of nicotine dependence: Secondary | ICD-10-CM | POA: Diagnosis not present

## 2018-07-25 DIAGNOSIS — I4891 Unspecified atrial fibrillation: Secondary | ICD-10-CM | POA: Diagnosis not present

## 2018-07-25 DIAGNOSIS — Z7951 Long term (current) use of inhaled steroids: Secondary | ICD-10-CM | POA: Diagnosis not present

## 2018-07-25 DIAGNOSIS — I10 Essential (primary) hypertension: Secondary | ICD-10-CM | POA: Diagnosis not present

## 2018-07-25 DIAGNOSIS — Z955 Presence of coronary angioplasty implant and graft: Secondary | ICD-10-CM | POA: Diagnosis not present

## 2018-07-25 HISTORY — PX: SPINAL CORD STIMULATOR INSERTION: SHX5378

## 2018-07-25 LAB — PROTIME-INR
INR: 1 (ref 0.8–1.2)
Prothrombin Time: 12.9 seconds (ref 11.4–15.2)

## 2018-07-25 SURGERY — INSERTION, SPINAL CORD STIMULATOR, LUMBAR
Anesthesia: General | Site: Spine Lumbar

## 2018-07-25 MED ORDER — EPHEDRINE SULFATE 50 MG/ML IJ SOLN
INTRAMUSCULAR | Status: DC | PRN
  Administered 2018-07-25: 5 mg via INTRAVENOUS

## 2018-07-25 MED ORDER — ONDANSETRON HCL 4 MG PO TABS: 4.0000 mg | ORAL_TABLET | Freq: Four times a day (QID) | ORAL | Status: DC | PRN

## 2018-07-25 MED ORDER — DULOXETINE HCL 60 MG PO CPEP
60.0000 mg | ORAL_CAPSULE | Freq: Two times a day (BID) | ORAL | Status: DC
  Administered 2018-07-25 – 2018-07-26 (×2): 60 mg via ORAL
  Filled 2018-07-25 (×2): qty 1

## 2018-07-25 MED ORDER — HEMOSTATIC AGENTS (NO CHARGE) OPTIME
TOPICAL | Status: DC | PRN
  Administered 2018-07-25: 1 via TOPICAL

## 2018-07-25 MED ORDER — OXYCODONE HCL 5 MG/5ML PO SOLN: 5.0000 mg | Freq: Once | ORAL | Status: AC | PRN

## 2018-07-25 MED ORDER — DICYCLOMINE HCL 10 MG PO CAPS
10.0000 mg | ORAL_CAPSULE | Freq: Three times a day (TID) | ORAL | Status: DC | PRN
  Filled 2018-07-25: qty 1

## 2018-07-25 MED ORDER — SUCCINYLCHOLINE CHLORIDE 200 MG/10ML IV SOSY
PREFILLED_SYRINGE | INTRAVENOUS | Status: AC
  Filled 2018-07-25: qty 30

## 2018-07-25 MED ORDER — MEPERIDINE HCL 25 MG/ML IJ SOLN: 6.2500 mg | INTRAMUSCULAR | Status: DC | PRN

## 2018-07-25 MED ORDER — EZETIMIBE 10 MG PO TABS
10.0000 mg | ORAL_TABLET | Freq: Every day | ORAL | Status: DC
  Administered 2018-07-26: 09:00:00 10 mg via ORAL
  Filled 2018-07-25: qty 1

## 2018-07-25 MED ORDER — FENTANYL CITRATE (PF) 250 MCG/5ML IJ SOLN
INTRAMUSCULAR | Status: DC | PRN
  Administered 2018-07-25: 50 ug via INTRAVENOUS
  Administered 2018-07-25: 100 ug via INTRAVENOUS

## 2018-07-25 MED ORDER — SIMVASTATIN 20 MG PO TABS: 20.0000 mg | ORAL_TABLET | Freq: Every day | ORAL | Status: DC

## 2018-07-25 MED ORDER — OXYCODONE-ACETAMINOPHEN 10-325 MG PO TABS: 1.0000 | ORAL_TABLET | Freq: Four times a day (QID) | ORAL | 0 refills | Status: AC | PRN

## 2018-07-25 MED ORDER — MIDAZOLAM HCL 2 MG/2ML IJ SOLN
INTRAMUSCULAR | Status: AC
  Filled 2018-07-25: qty 2

## 2018-07-25 MED ORDER — SODIUM CHLORIDE 0.9% FLUSH: 3.0000 mL | INTRAVENOUS | Status: DC | PRN

## 2018-07-25 MED ORDER — MOMETASONE FURO-FORMOTEROL FUM 100-5 MCG/ACT IN AERO
2.0000 | INHALATION_SPRAY | Freq: Two times a day (BID) | RESPIRATORY_TRACT | Status: DC
  Administered 2018-07-26: 2 via RESPIRATORY_TRACT
  Filled 2018-07-25: qty 8.8

## 2018-07-25 MED ORDER — ACETAMINOPHEN 650 MG RE SUPP: 650.0000 mg | RECTAL | Status: DC | PRN

## 2018-07-25 MED ORDER — ALBUTEROL SULFATE (2.5 MG/3ML) 0.083% IN NEBU
2.5000 mg | INHALATION_SOLUTION | Freq: Two times a day (BID) | RESPIRATORY_TRACT | Status: DC
  Administered 2018-07-25 – 2018-07-26 (×2): 2.5 mg via RESPIRATORY_TRACT
  Filled 2018-07-25 (×2): qty 3

## 2018-07-25 MED ORDER — METHOCARBAMOL 1000 MG/10ML IJ SOLN
500.0000 mg | Freq: Four times a day (QID) | INTRAVENOUS | Status: DC | PRN
  Filled 2018-07-25: qty 5

## 2018-07-25 MED ORDER — ACETAMINOPHEN 325 MG PO TABS: 325.0000 mg | ORAL_TABLET | ORAL | Status: DC | PRN

## 2018-07-25 MED ORDER — SODIUM CHLORIDE 0.9% FLUSH
3.0000 mL | Freq: Two times a day (BID) | INTRAVENOUS | Status: DC
  Administered 2018-07-26: 3 mL via INTRAVENOUS

## 2018-07-25 MED ORDER — ONDANSETRON HCL 4 MG/2ML IJ SOLN: 4.0000 mg | Freq: Four times a day (QID) | INTRAMUSCULAR | Status: DC | PRN

## 2018-07-25 MED ORDER — ROCURONIUM BROMIDE 10 MG/ML (PF) SYRINGE
PREFILLED_SYRINGE | INTRAVENOUS | Status: DC | PRN
  Administered 2018-07-25: 30 mg via INTRAVENOUS

## 2018-07-25 MED ORDER — DILTIAZEM HCL ER COATED BEADS 240 MG PO CP24
240.0000 mg | ORAL_CAPSULE | Freq: Every day | ORAL | Status: DC
  Administered 2018-07-26: 09:00:00 240 mg via ORAL
  Filled 2018-07-25: qty 1

## 2018-07-25 MED ORDER — OXYCODONE HCL 5 MG PO TABS
ORAL_TABLET | ORAL | Status: AC
  Filled 2018-07-25: qty 1

## 2018-07-25 MED ORDER — FENTANYL CITRATE (PF) 250 MCG/5ML IJ SOLN
INTRAMUSCULAR | Status: AC
  Filled 2018-07-25: qty 5

## 2018-07-25 MED ORDER — 0.9 % SODIUM CHLORIDE (POUR BTL) OPTIME
TOPICAL | Status: DC | PRN
  Administered 2018-07-25: 14:00:00 1000 mL

## 2018-07-25 MED ORDER — ONDANSETRON HCL 4 MG/2ML IJ SOLN
INTRAMUSCULAR | Status: DC | PRN
  Administered 2018-07-25: 4 mg via INTRAVENOUS

## 2018-07-25 MED ORDER — CEFAZOLIN SODIUM-DEXTROSE 1-4 GM/50ML-% IV SOLN
1.0000 g | Freq: Three times a day (TID) | INTRAVENOUS | Status: AC
  Administered 2018-07-25 – 2018-07-26 (×2): 1 g via INTRAVENOUS
  Filled 2018-07-25 (×2): qty 50

## 2018-07-25 MED ORDER — METHOCARBAMOL 500 MG PO TABS: 500.0000 mg | ORAL_TABLET | Freq: Three times a day (TID) | ORAL | 0 refills | Status: AC | PRN

## 2018-07-25 MED ORDER — FENTANYL CITRATE (PF) 100 MCG/2ML IJ SOLN
25.0000 ug | INTRAMUSCULAR | Status: DC | PRN
  Administered 2018-07-25 (×2): 25 ug via INTRAVENOUS

## 2018-07-25 MED ORDER — METHOCARBAMOL 500 MG PO TABS
500.0000 mg | ORAL_TABLET | Freq: Four times a day (QID) | ORAL | Status: DC | PRN
  Administered 2018-07-25: 18:00:00 500 mg via ORAL
  Filled 2018-07-25: qty 1

## 2018-07-25 MED ORDER — METOPROLOL TARTRATE 25 MG PO TABS: 25.0000 mg | ORAL_TABLET | Freq: Two times a day (BID) | ORAL | Status: DC | PRN

## 2018-07-25 MED ORDER — ONDANSETRON HCL 4 MG PO TABS: 4.0000 mg | ORAL_TABLET | Freq: Three times a day (TID) | ORAL | 0 refills | Status: DC | PRN

## 2018-07-25 MED ORDER — ACETAMINOPHEN 325 MG PO TABS: 650.0000 mg | ORAL_TABLET | ORAL | Status: DC | PRN

## 2018-07-25 MED ORDER — ROCURONIUM BROMIDE 10 MG/ML (PF) SYRINGE
PREFILLED_SYRINGE | INTRAVENOUS | Status: AC
  Filled 2018-07-25: qty 20

## 2018-07-25 MED ORDER — ALBUTEROL SULFATE (2.5 MG/3ML) 0.083% IN NEBU: 3.0000 mL | INHALATION_SOLUTION | Freq: Four times a day (QID) | RESPIRATORY_TRACT | Status: DC | PRN

## 2018-07-25 MED ORDER — ARTIFICIAL TEARS OPHTHALMIC OINT
TOPICAL_OINTMENT | OPHTHALMIC | Status: AC
  Filled 2018-07-25: qty 3.5

## 2018-07-25 MED ORDER — OXYCODONE HCL 5 MG PO TABS
5.0000 mg | ORAL_TABLET | Freq: Once | ORAL | Status: AC | PRN
  Administered 2018-07-25: 5 mg via ORAL

## 2018-07-25 MED ORDER — PHENYLEPHRINE 40 MCG/ML (10ML) SYRINGE FOR IV PUSH (FOR BLOOD PRESSURE SUPPORT)
PREFILLED_SYRINGE | INTRAVENOUS | Status: AC
  Filled 2018-07-25: qty 30

## 2018-07-25 MED ORDER — OXYCODONE HCL 5 MG PO TABS: 5.0000 mg | ORAL_TABLET | ORAL | Status: DC | PRN

## 2018-07-25 MED ORDER — FENTANYL CITRATE (PF) 100 MCG/2ML IJ SOLN
INTRAMUSCULAR | Status: AC
  Filled 2018-07-25: qty 2

## 2018-07-25 MED ORDER — MIDAZOLAM HCL 2 MG/2ML IJ SOLN
INTRAMUSCULAR | Status: DC | PRN
  Administered 2018-07-25: 2 mg via INTRAVENOUS

## 2018-07-25 MED ORDER — ONDANSETRON HCL 4 MG/2ML IJ SOLN
INTRAMUSCULAR | Status: AC
  Filled 2018-07-25: qty 2

## 2018-07-25 MED ORDER — SODIUM CHLORIDE 0.9 % IV SOLN
INTRAVENOUS | Status: DC | PRN
  Administered 2018-07-25: 25 ug/min via INTRAVENOUS

## 2018-07-25 MED ORDER — LACTATED RINGERS IV SOLN
INTRAVENOUS | Status: DC
  Administered 2018-07-25: 18:00:00 via INTRAVENOUS

## 2018-07-25 MED ORDER — SUGAMMADEX SODIUM 200 MG/2ML IV SOLN
INTRAVENOUS | Status: DC | PRN
  Administered 2018-07-25: 187 mg via INTRAVENOUS

## 2018-07-25 MED ORDER — PHENOL 1.4 % MT LIQD
1.0000 | OROMUCOSAL | Status: DC | PRN
  Filled 2018-07-25: qty 177

## 2018-07-25 MED ORDER — BUPIVACAINE-EPINEPHRINE 0.25% -1:200000 IJ SOLN
INTRAMUSCULAR | Status: DC | PRN
  Administered 2018-07-25: 10 mL

## 2018-07-25 MED ORDER — LIDOCAINE 2% (20 MG/ML) 5 ML SYRINGE
INTRAMUSCULAR | Status: AC
  Filled 2018-07-25: qty 5

## 2018-07-25 MED ORDER — TRANEXAMIC ACID 1000 MG/10ML IV SOLN
INTRAVENOUS | Status: DC | PRN
  Administered 2018-07-25: 1000 mg via INTRAVENOUS

## 2018-07-25 MED ORDER — NITROGLYCERIN 0.4 MG SL SUBL: 0.4000 mg | SUBLINGUAL_TABLET | SUBLINGUAL | Status: DC | PRN

## 2018-07-25 MED ORDER — ACETAMINOPHEN 160 MG/5ML PO SOLN: 325.0000 mg | ORAL | Status: DC | PRN

## 2018-07-25 MED ORDER — LIDOCAINE 2% (20 MG/ML) 5 ML SYRINGE
INTRAMUSCULAR | Status: DC | PRN
  Administered 2018-07-25: 100 mg via INTRAVENOUS

## 2018-07-25 MED ORDER — MENTHOL 3 MG MT LOZG
1.0000 | LOZENGE | OROMUCOSAL | Status: DC | PRN
  Filled 2018-07-25: qty 9

## 2018-07-25 MED ORDER — EPHEDRINE 5 MG/ML INJ
INTRAVENOUS | Status: AC
  Filled 2018-07-25: qty 10

## 2018-07-25 MED ORDER — SUCCINYLCHOLINE CHLORIDE 200 MG/10ML IV SOSY
PREFILLED_SYRINGE | INTRAVENOUS | Status: DC | PRN
  Administered 2018-07-25: 160 mg via INTRAVENOUS

## 2018-07-25 MED ORDER — LACTATED RINGERS IV SOLN
INTRAVENOUS | Status: DC
  Administered 2018-07-25: 11:00:00 via INTRAVENOUS

## 2018-07-25 MED ORDER — BUPIVACAINE-EPINEPHRINE (PF) 0.25% -1:200000 IJ SOLN
INTRAMUSCULAR | Status: AC
  Filled 2018-07-25: qty 30

## 2018-07-25 MED ORDER — DEXAMETHASONE SODIUM PHOSPHATE 10 MG/ML IJ SOLN
INTRAMUSCULAR | Status: DC | PRN
  Administered 2018-07-25: 5 mg via INTRAVENOUS

## 2018-07-25 MED ORDER — OXYCODONE HCL 5 MG PO TABS
10.0000 mg | ORAL_TABLET | ORAL | Status: DC | PRN
  Administered 2018-07-25 – 2018-07-26 (×6): 10 mg via ORAL
  Filled 2018-07-25 (×6): qty 2

## 2018-07-25 MED ORDER — DEXAMETHASONE SODIUM PHOSPHATE 10 MG/ML IJ SOLN
INTRAMUSCULAR | Status: AC
  Filled 2018-07-25: qty 1

## 2018-07-25 MED ORDER — PROPOFOL 10 MG/ML IV BOLUS
INTRAVENOUS | Status: DC | PRN
  Administered 2018-07-25: 160 mg via INTRAVENOUS

## 2018-07-25 MED ORDER — ONDANSETRON HCL 4 MG/2ML IJ SOLN: 4.0000 mg | Freq: Once | INTRAMUSCULAR | Status: DC | PRN

## 2018-07-25 SURGICAL SUPPLY — 62 items
AGENT HMST KT MTR STRL THRMB (HEMOSTASIS) ×1
CANISTER SUCT 3000ML PPV (MISCELLANEOUS) ×3 IMPLANT
CLOSURE STERI-STRIP 1/2X4 (GAUZE/BANDAGES/DRESSINGS) ×2
CLOSURE WOUND 1/2 X4 (GAUZE/BANDAGES/DRESSINGS) ×1
CLSR STERI-STRIP ANTIMIC 1/2X4 (GAUZE/BANDAGES/DRESSINGS) ×4 IMPLANT
COVER MAYO STAND STRL (DRAPES) ×3 IMPLANT
COVER PROBE W GEL 5X96 (DRAPES) IMPLANT
COVER SURGICAL LIGHT HANDLE (MISCELLANEOUS) ×3 IMPLANT
COVER WAND RF STERILE (DRAPES) ×3 IMPLANT
DRAPE C-ARM 42X72 X-RAY (DRAPES) ×3 IMPLANT
DRAPE INCISE IOBAN 85X60 (DRAPES) ×3 IMPLANT
DRAPE SURG 17X23 STRL (DRAPES) ×3 IMPLANT
DRAPE U-SHAPE 47X51 STRL (DRAPES) ×3 IMPLANT
DRSG OPSITE POSTOP 3X4 (GAUZE/BANDAGES/DRESSINGS) ×2 IMPLANT
DRSG OPSITE POSTOP 4X6 (GAUZE/BANDAGES/DRESSINGS) ×6 IMPLANT
DURAPREP 26ML APPLICATOR (WOUND CARE) ×3 IMPLANT
ELECT BLADE 4.0 EZ CLEAN MEGAD (MISCELLANEOUS)
ELECT CAUTERY BLADE 6.4 (BLADE) ×3 IMPLANT
ELECT PENCIL ROCKER SW 15FT (MISCELLANEOUS) ×3 IMPLANT
ELECT REM PT RETURN 9FT ADLT (ELECTROSURGICAL) ×3
ELECTRODE BLDE 4.0 EZ CLN MEGD (MISCELLANEOUS) IMPLANT
ELECTRODE REM PT RTRN 9FT ADLT (ELECTROSURGICAL) ×1 IMPLANT
GENERATOR PULSE PROCLAIM 5ELIT (Neuro Prosthesis/Implant) IMPLANT
GLOVE BIO SURGEON STRL SZ7 (GLOVE) ×3 IMPLANT
GLOVE BIOGEL PI IND STRL 7.0 (GLOVE) ×1 IMPLANT
GLOVE BIOGEL PI IND STRL 8.5 (GLOVE) ×1 IMPLANT
GLOVE BIOGEL PI INDICATOR 7.0 (GLOVE) ×2
GLOVE BIOGEL PI INDICATOR 8.5 (GLOVE) ×2
GLOVE SS N UNI LF 8.5 STRL (GLOVE) ×6 IMPLANT
GOWN STRL REUS W/ TWL LRG LVL3 (GOWN DISPOSABLE) ×2 IMPLANT
GOWN STRL REUS W/TWL 2XL LVL3 (GOWN DISPOSABLE) ×3 IMPLANT
GOWN STRL REUS W/TWL LRG LVL3 (GOWN DISPOSABLE) ×6
KIT BASIN OR (CUSTOM PROCEDURE TRAY) ×3 IMPLANT
KIT TURNOVER KIT B (KITS) ×3 IMPLANT
LEAD LAMITRODE 60CM 8CH (Lead) ×2 IMPLANT
NEEDLE 22X1 1/2 (OR ONLY) (NEEDLE) ×3 IMPLANT
NEEDLE SPNL 18GX3.5 QUINCKE PK (NEEDLE) ×3 IMPLANT
NS IRRIG 1000ML POUR BTL (IV SOLUTION) ×3 IMPLANT
PACK LAMINECTOMY ORTHO (CUSTOM PROCEDURE TRAY) ×3 IMPLANT
PACK UNIVERSAL I (CUSTOM PROCEDURE TRAY) ×3 IMPLANT
PAD ARMBOARD 7.5X6 YLW CONV (MISCELLANEOUS) ×6 IMPLANT
PATIENT CONTROLLER ×2 IMPLANT
PROGRAMMER DBS W/MAGNET NMRI (MISCELLANEOUS) ×2 IMPLANT
PULSE GENERATOR PROCLAIM 5ELIT (Neuro Prosthesis/Implant) ×3 IMPLANT
SPATULA SILICONE BRAIN 10MM (MISCELLANEOUS) IMPLANT
SPONGE LAP 4X18 RFD (DISPOSABLE) IMPLANT
SPONGE SURGIFOAM ABS GEL 100 (HEMOSTASIS) ×3 IMPLANT
STAPLER VISISTAT 35W (STAPLE) ×3 IMPLANT
STRIP CLOSURE SKIN 1/2X4 (GAUZE/BANDAGES/DRESSINGS) ×2 IMPLANT
SURGIFLO W/THROMBIN 8M KIT (HEMOSTASIS) ×5 IMPLANT
SUT BONE WAX W31G (SUTURE) ×3 IMPLANT
SUT ETHIBOND 2 OS 4 DA (SUTURE) ×3 IMPLANT
SUT MNCRL AB 3-0 PS2 18 (SUTURE) ×6 IMPLANT
SUT VIC AB 1 CT1 18XCR BRD 8 (SUTURE) ×2 IMPLANT
SUT VIC AB 1 CT1 8-18 (SUTURE) ×9
SUT VIC AB 2-0 CT1 18 (SUTURE) ×3 IMPLANT
SYR BULB IRRIGATION 50ML (SYRINGE) ×3 IMPLANT
SYR CONTROL 10ML LL (SYRINGE) ×3 IMPLANT
TOWEL OR 17X24 6PK STRL BLUE (TOWEL DISPOSABLE) ×3 IMPLANT
TOWEL OR 17X26 10 PK STRL BLUE (TOWEL DISPOSABLE) ×3 IMPLANT
WATER STERILE IRR 1000ML POUR (IV SOLUTION) ×3 IMPLANT
YANKAUER SUCT BULB TIP NO VENT (SUCTIONS) ×3 IMPLANT

## 2018-07-25 NOTE — Anesthesia Postprocedure Evaluation (Signed)
Anesthesia Post Note  Patient: Troy Jackson  Procedure(s) Performed: LUMBAR SPINAL CORD STIMULATOR INSERTION (N/A Spine Lumbar)     Patient location during evaluation: PACU Anesthesia Type: General Level of consciousness: awake and alert Pain management: pain level controlled Vital Signs Assessment: post-procedure vital signs reviewed and stable Respiratory status: spontaneous breathing, nonlabored ventilation, respiratory function stable and patient connected to nasal cannula oxygen Cardiovascular status: blood pressure returned to baseline and stable Postop Assessment: no apparent nausea or vomiting Anesthetic complications: no    Last Vitals:  Vitals:   07/25/18 1636 07/25/18 1702  BP: 126/90 133/67  Pulse: 71 76  Resp: 12 16  Temp: 36.6 C   SpO2: 94% 99%    Last Pain:  Vitals:   07/25/18 1735  TempSrc:   PainSc: 6                  Johnye Kist

## 2018-07-25 NOTE — Discharge Instructions (Signed)
Surgical Spinal Decompression, Care After Refer to this sheet in the next few weeks. These instructions provide you with information about caring for yourself after your procedure. Your health care provider may also give you more specific instructions. Your treatment has been planned according to current medical practices, but problems sometimes occur. Call your health care provider if you have any problems or questions after your procedure. What can I expect after the procedure? It is common to have pain for the first few days after the procedure. Some people continue to have mild pain even after making a full recovery. Follow these instructions at home: Medicine  Take medicines only as directed by your health care provider.  Avoid taking over-the-counter pain medicines unless your health care provider tells you otherwise. These medicines interfere with the development and growth of new bone cells.  If you were prescribed a narcotic pain medicine, take it exactly as told by your health care provider. ? Do not drink alcohol while on the medicine. ? Do not drive while on the medicine. Injury care  Care for your back brace as told by your health care provider.  If directed, apply ice to the injured area: ? Put ice in a plastic bag. ? Place a towel between your skin and the bag. ? Leave the ice on for 20 minutes, 2-3 times a day. Activity  Perform physical therapy exercises as told by your health care provider.  Exercise regularly. Start by taking short walks. Slowly increase your activity level over time. Gentle exercise helps to ease pain.  Sit, stand, walk, turn in bed, and reposition yourself as told by your health care provider. This will help to keep your spine in proper alignment.  Avoid bending and twisting your body.  Avoid doing strenuous household chores, such as vacuuming.  Do not lift anything that is heavier than 10 lb (4.5 kg). Other Instructions  Keep all follow-up  visits as directed by your health care provider. This is important.  Do not use any tobacco products, including cigarettes, chewing tobacco, or electronic cigarettes. If you need help quitting, ask your health care provider. Nicotine affects the way bones heal. Contact a health care provider if:  Your pain gets worse.  You have a fever.  You have redness, swelling, or pain at the site of your incision.  You have fluid, blood, or pus coming from your incision.  You have numbness, tingling, or weakness in any part of your body. Get help right away if:  Your incision feels swollen and tender, and the surrounding area looks like a lump. The lump may be red or bluish in color.  You cannot move any part of your body (paralysis).  You cannot control your bladder or bowels. This information is not intended to replace advice given to you by your health care provider. Make sure you discuss any questions you have with your health care provider.                        OK TO RESTART ELIQUIS Friday EVENING.

## 2018-07-25 NOTE — Anesthesia Procedure Notes (Signed)
Procedure Name: Intubation Date/Time: 07/25/2018 1:19 PM Performed by: Mayer Camel, CRNA Pre-anesthesia Checklist: Patient identified, Emergency Drugs available, Suction available and Patient being monitored Patient Re-evaluated:Patient Re-evaluated prior to induction Oxygen Delivery Method: Circle System Utilized Preoxygenation: Pre-oxygenation with 100% oxygen Induction Type: IV induction and Rapid sequence Laryngoscope Size: Miller and 2 Grade View: Grade I Tube type: Oral Tube size: 7.0 mm Number of attempts: 1 Airway Equipment and Method: Stylet and Oral airway Placement Confirmation: ETT inserted through vocal cords under direct vision,  positive ETCO2 and breath sounds checked- equal and bilateral Secured at: 22 cm Tube secured with: Tape Dental Injury: Teeth and Oropharynx as per pre-operative assessment

## 2018-07-25 NOTE — H&P (Signed)
No change to patient's H&P.  Plan on proceeding with implantation of spinal cord stimulator.  I again reviewed procedure as well as risks and benefits with the patient and all of his questions were addressed.

## 2018-07-25 NOTE — Transfer of Care (Signed)
Immediate Anesthesia Transfer of Care Note  Patient: Troy Jackson  Procedure(s) Performed: LUMBAR SPINAL CORD STIMULATOR INSERTION (N/A Spine Lumbar)  Patient Location: PACU  Anesthesia Type:General  Level of Consciousness: awake, alert  and oriented  Airway & Oxygen Therapy: Patient Spontanous Breathing and Patient connected to face mask oxygen  Post-op Assessment: Report given to RN and Post -op Vital signs reviewed and stable  Post vital signs: Reviewed and stable  Last Vitals:  Vitals Value Taken Time  BP 148/79 07/25/2018  3:22 PM  Temp    Pulse 76 07/25/2018  3:26 PM  Resp 13 07/25/2018  3:26 PM  SpO2 100 % 07/25/2018  3:26 PM  Vitals shown include unvalidated device data.  Last Pain:  Vitals:   07/25/18 1029  TempSrc: Oral      Patients Stated Pain Goal: 0 (07/25/18 1043)  Complications: No apparent anesthesia complications

## 2018-07-25 NOTE — Brief Op Note (Signed)
07/25/2018  3:03 PM  PATIENT:  Troy Jackson  73 y.o. male  PRE-OPERATIVE DIAGNOSIS:  Chronic pain syndrome  POST-OPERATIVE DIAGNOSIS:  Chronic pain syndrome  PROCEDURE:  Procedure(s) with comments: LUMBAR SPINAL CORD STIMULATOR INSERTION (N/A) - 2 hrs  SURGEON:  Surgeon(s) and Role:    Venita Lick, MD - Primary  PHYSICIAN ASSISTANT:   ASSISTANTS: Amanda Ward   ANESTHESIA:   general  EBL:  50 mL   BLOOD ADMINISTERED:none  DRAINS: none   LOCAL MEDICATIONS USED:  MARCAINE     SPECIMEN:  No Specimen  DISPOSITION OF SPECIMEN:  N/A  COUNTS:  YES  TOURNIQUET:  * No tourniquets in log *  DICTATION: .Dragon Dictation  PLAN OF CARE: Admit for overnight observation  PATIENT DISPOSITION:  PACU - hemodynamically stable.

## 2018-07-25 NOTE — Op Note (Signed)
Operative report  Preoperative diagnosis: Chronic pain syndrome, status post successful spinal cord stimulator trial  Postoperative diagnosis: Same  Operative procedure: Implantation of spinal cord stimulator  First Assistant: Glynis Smiles, PA  Implants: Abbott spine-proclaim XR 5 battery.  Lamitrode (S8) paddle  Complications: None  Indications: This is a very pleasant 73 year old gentleman who is had chronic back buttock and bilateral neuropathic leg pain for some time now.  Earlier in the year he underwent a successful spinal cord stimulator trial with significant improvement in his overall pain and quality of life.  As result he presents today for permanent implantation.  All appropriate risks benefits and alternatives were discussed with the patient and consent was obtained.  Operative report patient was brought the operating room placed upon the operating room table.  After successful induction of general anesthesia and endotracheally patient teds SCDs were applied and he was turned prone onto the Hanshaw frame.  All bony prominences well-padded and the back was prepped and draped in a standard fashion.  Timeout was taken to confirm patient procedure and all other important data.  X-ray was used to identify the T12 vertebral body.  I then counted up and identified the T10 pedicle.  I marked the T10 pedicle down to the superior portion of the T12 pedicle.  I infiltrated this incision site with quarter percent Marcaine.  In the preoperative holding area I mapped out where his battery site incision would be in this area was also infiltrated with quarter percent Marcaine with epinephrine.  Midline incision was made in the thoracic spine and sharp dissection was carried down to the deep fascia.  I stripped the deep fascia to expose the T10 spinous process and lamina as well as that of the T11 and the superior portion of that of T12.  Once I had bilateral exposure of the posterior bony elements of  the thoracic spine I irrigated the wound copiously with normal saline.  Using proper electrocautery to obtain hemostasis.  Using x-ray I again identified the T12 vertebral body based on it being the last rib-bearing vertebrae.  I counted up and confirmed that I was at the T10-T11 intervertebral space.  Once I confirmI continued with my laminotomy.  I remove the posterior portion of the spinous process of T10 and then used a 1 and 2 mm Kerrison rongeurs to perform a laminotomy of T10.  I divided the central raphae of the ligamentum flavum and used my Penfield 4 to dissect through this area and create a plane between the epidural fat and the omentum flavum.  Use my 1 and 2 mm Kerrison rongeurs I remove the ligamentum flavum.  There was some medial osteophytes from the superior portion of T11 which I was able to remove.  At this point I could easily pass my [facility] elevator superiorly underneath the meaning T10 lamina.  At this point I obtained the S8 paddle and I was able to easily pass it in the midline to the inferior portion of the T8 vertebral body.  According to the [facility] rep the paddle was well positioned in the midline and covered the same location that the trial percutaneously did.  She was comfortable with its location and felt as though she can get adequate coverage for his pain.  At this point with the paddle properly positioned I then secured it directly to the intact minus process of T11 using a #1 Ethibond suture.  With the leads secured I then wrapped around the T11 spinous process  for added stability.  Second incision was made over the battery site and I bluntly dissected down to create a pocket approximately 3 cm below the skin surface.  I then used my submuscular passer to pass the lead from the thoracic wound to the battery site.  At this point I inserted the single lead into the battery and it was locked in place using the torque wrench.  I then placed the battery into the pocket and secured  it to the deep fascia with two #1 Vicryl sutures.  At this point AP and lateral fluoroscopy views were taken of the paddle to confirm satisfactory positioning in both planes.  Once this was confirmed both wounds were copiously irrigated with normal saline.  Stasis was confirmed using bipolar electrocautery prior to placing the battery on the field.  After the battery is in place and I irrigated copiously hemostasis was confirmed.  I then closed in a layered fashion with interrupted #1 Vicryl suture, 2-0 Vicryl suture, and 3-0 Monocryl for the skin.  Steri-Strips and dry dressing were applied and the patient was ultimately extubated transfer the PACU without incident.  The end of the case all needle sponge counts were correct.

## 2018-07-26 DIAGNOSIS — G894 Chronic pain syndrome: Secondary | ICD-10-CM | POA: Diagnosis not present

## 2018-07-26 NOTE — Progress Notes (Signed)
OT Cancellation Note  Patient Details Name: Troy Jackson MRN: [id number] DOB: 1945/10/12   Cancelled Treatment:    Reason Eval/Treat Not Completed: OT screened, no needs identified, will sign off.  Pt reports he knows his precautions and how to get dressed safely.   He is able to perform figure 4 for LB ADLs - reinforced back precautions.   Jeani Hawking, OTR/L Acute Rehabilitation Services Pager [phone] Office [phone]   Jeani Hawking M 07/26/2018, 11:01 AM

## 2018-07-26 NOTE — Progress Notes (Signed)
    Subjective: Procedure(s) (LRB): LUMBAR SPINAL CORD STIMULATOR INSERTION (N/A) 1 Day Post-Op  Patient reports pain as 1 on 0-10 scale.  Reports decreased leg pain reports incisional back pain   Positive void Negative bowel movement Positive flatus Negative chest pain or shortness of breath  Objective: Vital signs in last 24 hours: Temp:  [97.3 F (36.3 C)-98.3 F (36.8 C)] 98.3 F (36.8 C) (05/14 1139) Pulse Rate:  [63-94] 94 (05/14 1139) Resp:  [12-20] 18 (05/14 0756) BP: (115-160)/(49-90) 137/64 (05/14 1139) SpO2:  [93 %-100 %] 93 % (05/14 1139)  Intake/Output from previous day: 05/13 0701 - 05/14 0700 In: 1810.2 [I.V.:1760.2; IV Piggyback:50] Out: 50 [Blood:50]  Labs: No results for input(s): WBC, RBC, HCT, PLT in the last 72 hours. No results for input(s): NA, K, CL, CO2, BUN, CREATININE, GLUCOSE, CALCIUM in the last 72 hours. Recent Labs    07/25/18 1033  INR 1.0    Physical Exam: Neurologically intact ABD soft Intact pulses distally Incision: dressing C/D/I and no drainage Compartment soft Body mass index is 27.2 kg/m.   Assessment/Plan: Patient stable  xrays n/a Continue mobilization with physical therapy Continue care  Advance diet Up with therapy  Ok for d/c to home Pain well controlled Ambulating without issues  F/u 2 weeks for wound check  Venita Lick, MD [facility] [phone]

## 2018-07-26 NOTE — Progress Notes (Signed)
Pt given d/c education and all questions answered. Printed prescriptions attached to d/c information, no equipment to deliver. IV removed. Pt taken to car with all belongings.

## 2018-07-26 NOTE — Evaluation (Addendum)
Physical Therapy Evaluation Patient Details Name: Troy CavalierWilliam S Smejkal MRN: [id number] DOB: 01/28/1946 Today's Date: 07/26/2018   History of Present Illness  Pt is a 73 y.o. M with significant PMH of COPD, CAD, NSTEMI, a fib who presents s/p lumbar spinal cord stimulator insertment 5/13.    Clinical Impression  Patient evaluated by Physical Therapy with no further acute PT needs identified. Prior to admission, pt very active and enjoys walking and golfing. On PT evaluation, pt ambulating hallway distances independently, demonstrating good posture and gait speed. Pt declined stair training. Education re: spinal precautions, activity restrictions (I.e. no golf), generalized walking program, cryotherapy. All education has been completed and the patient has no further questions. No follow-up Physical Therapy or equipment needs. PT is signing off. Thank you for this referral.     Follow Up Recommendations No PT follow up    Equipment Recommendations  None recommended by PT    Recommendations for Other Services       Precautions / Restrictions Precautions Precautions: Back Precaution Booklet Issued: Yes (comment) Precaution Comments: verbally reviewed and provided written handout Restrictions Weight Bearing Restrictions: No      Mobility  Bed Mobility Overal bed mobility: Modified Independent             General bed mobility comments: HOB elevated  Transfers Overall transfer level: Independent Equipment used: None                Ambulation/Gait Ambulation/Gait assistance: Independent Gait Distance (Feet): 200 Feet Assistive device: None Gait Pattern/deviations: WFL(Within Functional Limits)   Gait velocity interpretation: >2.62 ft/sec, indicative of community ambulatory General Gait Details: Good posture and gait speed  Stairs            Wheelchair Mobility    Modified Rankin (Stroke Patients Only)       Balance Overall balance assessment: Independent                                            Pertinent Vitals/Pain Pain Assessment: Faces Faces Pain Scale: Hurts little more Pain Location: bilateral hips Pain Descriptors / Indicators: Sore Pain Intervention(s): Monitored during session    Home Living Family/patient expects to be discharged to:: Private residence Living Arrangements: Spouse/significant other Available Help at Discharge: Family Type of Home: House Home Access: Stairs to enter   Secretary/administratorntrance Stairs-Number of Steps: 4 Home Layout: One level Home Equipment: None      Prior Function Level of Independence: Independent         Comments: Very active, enjoys playing golf. 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[employer], prison and probation servicesfire department     Hand Dominance        Extremity/Trunk Assessment   Upper Extremity Assessment Upper Extremity Assessment: RUE deficits/detail;LUE deficits/detail RUE Deficits / Details: Strength 5/5 LUE Deficits / Details: Strength 5/5    Lower Extremity Assessment Lower Extremity Assessment: RLE deficits/detail;LLE deficits/detail RLE Deficits / Details: Strength 5/5 LLE Deficits / Details: Strength 5/5    Cervical / Trunk Assessment Cervical / Trunk Assessment: Other exceptions Cervical / Trunk Exceptions: s/p spinal cord stimulator placement  Communication   Communication: No difficulties  Cognition Arousal/Alertness: Awake/alert Behavior During Therapy: WFL for tasks assessed/performed Overall Cognitive Status: Within Functional Limits for tasks assessed  General Comments      Exercises     Assessment/Plan    PT Assessment Patent does not need any further PT services  PT Problem List         PT Treatment Interventions      PT Goals (Current goals can be found in the Care Plan section)  Acute Rehab PT Goals Patient Stated Goal: "get back to playing golf." PT Goal Formulation: All assessment and education complete,  DC therapy    Frequency     Barriers to discharge        Co-evaluation               AM-PAC PT "6 Clicks" Mobility  Outcome Measure Help needed turning from your back to your side while in a flat bed without using bedrails?: None Help needed moving from lying on your back to sitting on the side of a flat bed without using bedrails?: None Help needed moving to and from a bed to a chair (including a wheelchair)?: None Help needed standing up from a chair using your arms (e.g., wheelchair or bedside chair)?: None Help needed to walk in hospital room?: None Help needed climbing 3-5 steps with a railing? : None 6 Click Score: 24    End of Session   Activity Tolerance: Patient tolerated treatment well Patient left: in chair;with call bell/phone within reach Nurse Communication: Mobility status PT Visit Diagnosis: Pain Pain - part of body: (back)    Time: 7619-5093 PT Time Calculation (min) (ACUTE ONLY): 18 min   Charges:   PT Evaluation $PT Eval Low Complexity: 1 Low         Laurina Bustle, PT, DPT Acute Rehabilitation Services Pager [phone] Office [phone]  Vanetta Mulders 07/26/2018, 9:54 AM

## 2018-07-26 NOTE — Progress Notes (Signed)
Spoke to MD over phone. MD printed AVS and attached signed prescriptions 07/25/2018 and placed in pt's chart. RN could not print AVS directly from chart.

## 2018-07-27 ENCOUNTER — Encounter (HOSPITAL_COMMUNITY): Payer: Self-pay | Admitting: Orthopedic Surgery

## 2018-07-27 NOTE — Discharge Summary (Signed)
Patient ID: Troy Jackson MRN: [id number] DOB/AGE: 1945/08/12 73 y.o.  Admit date: 07/25/2018 Discharge date: 07/27/2018  Admission Diagnoses:  Active Problems:   Chronic pain   Discharge Diagnoses:  Active Problems:   Chronic pain  status post Procedure(s): LUMBAR SPINAL CORD STIMULATOR INSERTION  Past Medical History:  Diagnosis Date  . Anxiety   . COPD (chronic obstructive pulmonary disease) (HCC)   . Coronary artery disease 2017   stent  . Diverticulitis   . Dyspnea    doe, with exertion  . Dysrhythmia    atrial fibrillation  . GERD (gastroesophageal reflux disease)   . Hyperlipidemia   . Hypertension   . Pneumonia    2019  . Tobacco abuse 2017    Surgeries: Procedure(s): LUMBAR SPINAL CORD STIMULATOR INSERTION on 07/25/2018   Consultants: none  Discharged Condition: Improved  Hospital Course: Troy Jackson is an 73 y.o. male who was admitted 07/25/2018 for operative treatment of chronic low back pain. Patient failed conservative treatments (please see the history and physical for the specifics) and had severe unremitting pain that affects sleep, daily activities and work/hobbies. After pre-op clearance, the patient was taken to the operating room on 07/25/2018 and underwent  Procedure(s): LUMBAR SPINAL CORD STIMULATOR INSERTION.    Patient was given perioperative antibiotics:  Anti-infectives (From admission, onward)   Start     Dose/Rate Route Frequency Ordered Stop   07/25/18 1800  ceFAZolin (ANCEF) IVPB 1 g/50 mL premix     1 g 100 mL/hr over 30 Minutes Intravenous Every 8 hours 07/25/18 1659 07/26/18 0330   07/25/18 1130  ceFAZolin (ANCEF) IVPB 2g/100 mL premix  Status:  Discontinued     2 g 200 mL/hr over 30 Minutes Intravenous To ShortStay Surgical 07/24/18 0754 07/25/18 1651       Patient was given sequential compression devices and early ambulation to prevent DVT.   Patient benefited maximally from hospital stay and there were no  complications. At the time of discharge, the patient was urinating/moving their bowels without difficulty, tolerating a regular diet, pain is controlled with oral pain medications and they have been cleared by PT/OT.   Recent vital signs: No data found.   Recent laboratory studies:  Recent Labs    07/25/18 1033  INR 1.0     Discharge Medications:   Allergies as of 07/26/2018      Reactions   Statins Other (See Comments)   "All statins: Myalgias"      Medication List    STOP taking these medications   acetaminophen 500 MG tablet Commonly known as:  TYLENOL   HYDROcodone-acetaminophen 10-325 MG tablet Commonly known as:  NORCO     TAKE these medications   albuterol (2.5 MG/3ML) 0.083% nebulizer solution Commonly known as:  PROVENTIL Take 2.5 mg by nebulization 2 (two) times a day.   albuterol 108 (90 Base) MCG/ACT inhaler Commonly known as:  VENTOLIN HFA Inhale 2 puffs into the lungs every 6 (six) hours as needed for wheezing or shortness of breath.   ALPRAZolam 0.5 MG tablet Commonly known as:  XANAX Take 0.5 mg by mouth 3 (three) times daily.   apixaban 5 MG Tabs tablet Commonly known as:  Eliquis Take 1 tablet (5 mg total) by mouth 2 (two) times daily.   budesonide-formoterol 80-4.5 MCG/ACT inhaler Commonly known as:  SYMBICORT Inhale 2 puffs into the lungs 2 (two) times daily.   dicyclomine 10 MG capsule Commonly known as:  BENTYL Take 10 mg by  mouth every 8 (eight) hours as needed for spasms.   diltiazem 240 MG 24 hr capsule Commonly known as:  CARDIZEM CD Take 1 capsule (240 mg total) by mouth daily.   DULoxetine 60 MG capsule Commonly known as:  CYMBALTA Take 60 mg by mouth 2 (two) times daily.   ezetimibe 10 MG tablet Commonly known as:  ZETIA Take 10 mg by mouth daily.   methocarbamol 500 MG tablet Commonly known as:  Robaxin Take 1 tablet (500 mg total) by mouth every 8 (eight) hours as needed for up to 5 days for muscle spasms.    metoprolol tartrate 25 MG tablet Commonly known as:  LOPRESSOR Take 0.5 tablets (12.5 mg total) by mouth 2 (two) times daily as needed (as needed fOR HR> 120/min.). What changed:  how much to take   nitroGLYCERIN 0.4 MG SL tablet Commonly known as:  NITROSTAT Place 1 tablet (0.4 mg total) under the tongue every 5 (five) minutes as needed for chest pain.   ondansetron 4 MG tablet Commonly known as:  Zofran Take 1 tablet (4 mg total) by mouth every 8 (eight) hours as needed for nausea or vomiting.   oxyCODONE-acetaminophen 10-325 MG tablet Commonly known as:  Percocet Take 1 tablet by mouth every 6 (six) hours as needed for up to 5 days for pain.   pantoprazole 40 MG tablet Commonly known as:  PROTONIX Take 1 tablet (40 mg total) by mouth daily. What changed:  when to take this   simvastatin 20 MG tablet Commonly known as:  ZOCOR Take 0.5 tablets (10 mg total) by mouth daily at 6 PM. What changed:    how much to take  when to take this   valsartan 80 MG tablet Commonly known as:  DIOVAN Take 80 mg by mouth at bedtime.   ZIMS MAX-FREEZE EX Apply 1 application topically daily as needed (muscle pain).       Diagnostic Studies: Dg Chest 2 View  Result Date: 07/23/2018 CLINICAL DATA:  Pre-admit, lumbar fusion EXAM: CHEST - 2 VIEW COMPARISON:  07/31/2017 FINDINGS: The heart size and mediastinal contours are within normal limits. Both lungs are clear. The visualized skeletal structures are unremarkable. IMPRESSION: No acute abnormality of the lungs.  No focal airspace opacity. Electronically Signed   By: Lauralyn Primes M.D.   On: 07/23/2018 15:17   Dg Thoracic Spine 2 View  Result Date: 07/25/2018 CLINICAL DATA:  Neurostimulator placement. EXAM: DG C-ARM 61-120 MIN; THORACIC SPINE 2 VIEWS COMPARISON:  Chest radiography 07/23/2018 FINDINGS: Three C-arm images show placement of a lower thoracic neurostimulator in the dorsal spinal canal from the inferior endplate of T8 to the  lower portion of T10. No visible complicating feature. IMPRESSION: Thoracic neurostimulator T8-T10. Electronically Signed   By: Paulina Fusi M.D.   On: 07/25/2018 15:32   Dg C-arm 1-60 Min  Result Date: 07/25/2018 CLINICAL DATA:  Neurostimulator placement. EXAM: DG C-ARM 61-120 MIN; THORACIC SPINE 2 VIEWS COMPARISON:  Chest radiography 07/23/2018 FINDINGS: Three C-arm images show placement of a lower thoracic neurostimulator in the dorsal spinal canal from the inferior endplate of T8 to the lower portion of T10. No visible complicating feature. IMPRESSION: Thoracic neurostimulator T8-T10. Electronically Signed   By: Paulina Fusi M.D.   On: 07/25/2018 15:32    Discharge Instructions    Incentive spirometry RT   Complete by:  As directed       Follow-up Information    Venita Lick, MD In 2 weeks.   Specialty:  Orthopedic Surgery Why:  If symptoms worsen, For suture removal, For wound re-check Contact information: [address] [place] [id number] [phone]           Discharge Plan:  discharge to home  Disposition: stable    Signed: Leonette MonarchAmanda  N Aminta Sakurai for [facility] Nimrat Woolworth PA-C [facility] [phone] 07/27/2018, 11:53 AM

## 2018-08-31 ENCOUNTER — Telehealth: Payer: Self-pay | Admitting: Cardiology

## 2018-08-31 NOTE — Telephone Encounter (Signed)
LVM for patient to call and confirm that he would like an in office visit with Dr. Martinique on 09-11-18.

## 2018-09-09 NOTE — Progress Notes (Signed)
Cardiology Office Note    Date:  09/11/2018   ID:  Troy FixWilliam S Jackson, DOB 09/11/1945, MRN [id number]  PCP:  Rodrigo RanPerini, Mark, MD  Cardiologist:  Rainelle Sulewski [place], MD    History of Present Illness:  [name] is a 73 y.o. male seen for follow up CAD. He has a history of HLD, tobacco abuse, COPD, and GERD. He had remote stress test in 2004 that was normal. He was admitted in November 2017 after presenting with severe chest pain. Troponin was mildly elevated at .06. Echo showed normal LV function. Cardiac cath showed 90% proximal to mid LCx stenosis treated successfully with DES. Discharged on ASA, Plavix, Vytorin. Beta blocker held due to bradycardia. He was intolerant of Brilinta due to dyspnea.   He was admitted 5/13-5/17/19 with PNA, sepsis, and acute respiratory failure. He went into Afib with RVR. He was started on IV Cardizem and converted to NSR. Troponin levels were normal. Echo looked OK. DC home on Eliquis. Antiplatelet therapy stopped. He was readmitted on 07/31/17 with chest pain and was back in AFib. Converted after IV cardizem and amiodarone. DC on Cardizem alone. Did not want to commit to AAD therapy yet.   In May 2020 he underwent implantation of lumbar spinal cord stimulator for chronic back pain.   On follow up today he is doing very well from a cardiac standpoint.  He still has significant back pain.  He is abstaining from alcohol.    No dyspnea, cough, chest pain. No edema. He only notes one time when HR got up to 123. No palpitations.   Past Medical History:  Diagnosis Date  . Anxiety   . COPD (chronic obstructive pulmonary disease) (HCC)   . Coronary artery disease 2017   stent  . Diverticulitis   . Dyspnea    doe, with exertion  . Dysrhythmia    atrial fibrillation  . GERD (gastroesophageal reflux disease)   . Hyperlipidemia   . Hypertension   . Pneumonia    2019  . Tobacco abuse 2017    Past Surgical History:  Procedure Laterality Date  . CARDIAC  CATHETERIZATION N/A 02/03/2016   Procedure: Left Heart Cath and Coronary Angiography;  Surgeon: Viral Schramm M [place], MD;  [facility];  Service: Cardiovascular;  Laterality: N/A;  . CARDIAC CATHETERIZATION N/A 02/03/2016   Procedure: Coronary Stent Intervention;  Surgeon: Alyas Creary M [place], MD;  [facility];  Service: Cardiovascular;  Laterality: N/A;  . COLONOSCOPY    . CORONARY ANGIOPLASTY WITH STENT PLACEMENT  02/03/2016  . SHOULDER SURGERY Right    as young adult  . SHOULDER SURGERY Left    as child  . SPINAL CORD STIMULATOR INSERTION N/A 07/25/2018   Procedure: LUMBAR SPINAL CORD STIMULATOR INSERTION;  Surgeon: Venita LickBrooks, Dahari, MD;  Location: [facility] OR;  Service: Orthopedics;  Laterality: N/A;  2 hrs    Current Medications: Outpatient Medications Prior to Visit  Medication Sig Dispense Refill  . albuterol (PROVENTIL HFA;VENTOLIN HFA) 108 (90 Base) MCG/ACT inhaler Inhale 2 puffs into the lungs every 6 (six) hours as needed for wheezing or shortness of breath.     Marland Kitchen. albuterol (PROVENTIL) (2.5 MG/3ML) 0.083% nebulizer solution Take 2.5 mg by nebulization 2 (two) times a day.    . ALPRAZolam (XANAX) 0.5 MG tablet Take 0.5 mg by mouth 3 (three) times daily.     Marland Kitchen. apixaban (ELIQUIS) 5 MG TABS tablet Take 1 tablet (5 mg total) by mouth 2 (two) times  daily. 60 tablet 0  . budesonide-formoterol (SYMBICORT) 80-4.5 MCG/ACT inhaler Inhale 2 puffs into the lungs 2 (two) times daily.     Marland Kitchen. dicyclomine (BENTYL) 10 MG capsule Take 10 mg by mouth every 8 (eight) hours as needed for spasms.     Marland Kitchen. diltiazem (CARDIZEM CD) 240 MG 24 hr capsule Take 1 capsule (240 mg total) by mouth daily. 30 capsule 1  . DULoxetine (CYMBALTA) 60 MG capsule Take 60 mg by mouth 2 (two) times daily.     Marland Kitchen. ezetimibe (ZETIA) 10 MG tablet Take 10 mg by mouth daily.    . Menthol, Topical Analgesic, (ZIMS MAX-FREEZE EX) Apply 1 application topically daily as needed (muscle pain).    . metoprolol tartrate  (LOPRESSOR) 25 MG tablet Take 0.5 tablets (12.5 mg total) by mouth 2 (two) times daily as needed (as needed fOR HR> 120/min.). (Patient taking differently: Take 25 mg by mouth 2 (two) times daily as needed (as needed fOR HR> 120/min.). ) 60 tablet 0  . nitroGLYCERIN (NITROSTAT) 0.4 MG SL tablet Place 1 tablet (0.4 mg total) under the tongue every 5 (five) minutes as needed for chest pain. 30 tablet 2  . ondansetron (ZOFRAN) 4 MG tablet Take 1 tablet (4 mg total) by mouth every 8 (eight) hours as needed for nausea or vomiting. 20 tablet 0  . pantoprazole (PROTONIX) 40 MG tablet Take 1 tablet (40 mg total) by mouth daily. (Patient taking differently: Take 40 mg by mouth at bedtime. ) 30 tablet 0  . simvastatin (ZOCOR) 20 MG tablet Take 0.5 tablets (10 mg total) by mouth daily at 6 PM. (Patient taking differently: Take 20 mg by mouth at bedtime. )    . valsartan (DIOVAN) 80 MG tablet Take 80 mg by mouth at bedtime.     No facility-administered medications prior to visit.      Allergies:   Statins   Social History   Socioeconomic History  . Marital status: Married    Spouse name: Not on file  . Number of children: 2  . Years of education: Not on file  . Highest education level: Not on file  Occupational History  . Occupation: retired Sales promotion account executiveplumbing; works as a Lobbyistfirefighter  Social Needs  . Financial resource strain: Not on file  . Food insecurity    Worry: Not on file    Inability: Not on file  . Transportation needs    Medical: Not on file    Non-medical: Not on file  Tobacco Use  . Smoking status: Former Smoker    Packs/day: 1.00    Years: 60.00    Pack years: 60.00    Types: Cigarettes    Quit date: 2017    Years since quitting: 3.4  . Smokeless tobacco: Never Used  Substance and Sexual Activity  . Alcohol use: Not Currently    Alcohol/week: 20.0 standard drinks    Types: 20 Cans of beer per week    Comment: cannot remember when his last day was without a drink  . Drug use: No  .  Sexual activity: Not on file  Lifestyle  . Physical activity    Days per week: Not on file    Minutes per session: Not on file  . Stress: Not on file  Relationships  . Social Musicianconnections    Talks on phone: Not on file    Gets together: Not on file    Attends religious service: Not on file    Active member of club  or organization: Not on file    Attends meetings of clubs or organizations: Not on file    Relationship status: Not on file  Other Topics Concern  . Not on file  Social History Narrative  . Not on file     Family History:  The patient's family history includes Alcohol abuse in his brother, father, and paternal grandfather; Alzheimer's disease in his mother; Heart attack in his father and paternal grandfather; Heart disease in his father; Lung cancer in his father.   ROS:   Please see the history of present illness.    ROS All other systems reviewed and are negative.   PHYSICAL EXAM:   VS:  BP (!) 142/52   Pulse 77   Temp 98.2 F (36.8 C)   Ht 6\' 1"  (1.854 m)   Wt 197 lb (89.4 kg)   SpO2 96%   BMI 25.99 kg/m    GENERAL:  Well appearing WM in NAD HEENT:  PERRL, EOMI, sclera are clear. Oropharynx is clear. NECK:  No jugular venous distention, carotid upstroke brisk and symmetric, no bruits, no thyromegaly or adenopathy LUNGS:  Clear to auscultation bilaterally CHEST:  Unremarkable HEART:  RRR,  PMI not displaced or sustained,S1 and S2 within normal limits, no S3, no S4: no clicks, no rubs, no murmurs ABD:  Soft, nontender. BS +, no masses or bruits. No hepatomegaly, no splenomegaly EXT:  2 + pulses throughout, no edema, no cyanosis no clubbing SKIN:  Warm and dry.  No rashes NEURO:  Alert and oriented x 3. Cranial nerves II through XII intact. PSYCH:  Cognitively intact    Wt Readings from Last 3 Encounters:  09/11/18 197 lb (89.4 kg)  07/25/18 206 lb 2.1 oz (93.5 kg)  07/23/18 206 lb 3.2 oz (93.5 kg)      Studies/Labs Reviewed:   EKG:  EKG is   ordered today.  NSR rate 64. Normal Ecg. I have personally reviewed and interpreted this study.   Recent Labs: 07/23/2018: BUN 24; Creatinine, Ser 1.16; Hemoglobin 13.9; Platelets 261; Potassium 4.3; Sodium 137   Lipid Panel    Component Value Date/Time   CHOL 195 02/03/2016 1005   TRIG 100 02/03/2016 1005   HDL 41 02/03/2016 1005   CHOLHDL 4.8 02/03/2016 1005   VLDL 20 02/03/2016 1005   LDLCALC 134 (H) 02/03/2016 1005    Echo: 07/27/17: Study Conclusions  - Left ventricle: The cavity size was normal. Posterior wall   thickness was increased in a pattern of mild LVH. Systolic   function was normal. The estimated ejection fraction was in the   range of 60% to 65%. Wall motion was normal; there were no   regional wall motion abnormalities. - Aortic valve: Transvalvular velocity was within the normal range.   There was no stenosis. There was no regurgitation. - Mitral valve: Transvalvular velocity was within the normal range.   There was no evidence for stenosis. There was no regurgitation. - Right ventricle: The cavity size was normal. Wall thickness was   normal. Systolic function was normal. - Tricuspid valve: There was no regurgitation.    ASSESSMENT:    1. Paroxysmal atrial fibrillation (HCC)   2. Coronary artery disease involving native coronary artery of native heart with other form of angina pectoris (HCC)   3. Hyperlipidemia LDL goal <70   4. Essential hypertension      PLAN:  In order of problems listed above:  1. CAD with history of NSTEMI, s/p stenting of the  LCx in November 2017 with DES.  He is asymptomatic. Not on antiplatelet therapy since he is on Eliquis. History of bradycardia on beta blocker.  2. Atrial fibrillation paroxysmal. In setting of acute PNA.  Will continue rate control with Cardizem. Continue Eliquis. Continue to abstain from Etoh.  3. Hyperlipidemia:  intolerant to Crestor. On low dose Zocor. States he had lab work with Dr. Joylene Draft- not  able to access in [place].  4. Tobacco use: now quit x 2 years.  Congratulated.  5. HTN controlled.   I will follow up in 6 months.  Medication Adjustments/Labs and Tests Ordered: Current medicines are reviewed at length with the patient today.  Concerns regarding medicines are outlined above.  Medication changes, Labs and Tests ordered today are listed in the Patient Instructions below. Patient Instructions  Continue your current therapy  Follow up in 6 months      Signed, Brennan Litzinger Martinique, MD  09/11/2018 10:54 AM    [facility] [address] [place], [place], [address] [phone]

## 2018-09-10 ENCOUNTER — Telehealth: Payer: Self-pay | Admitting: Cardiology

## 2018-09-10 NOTE — Telephone Encounter (Signed)
LVM , reminding pt of his appt for 09-11-18 with Dr Martinique.

## 2018-09-11 ENCOUNTER — Ambulatory Visit (INDEPENDENT_AMBULATORY_CARE_PROVIDER_SITE_OTHER): Payer: Medicare Other | Admitting: Cardiology

## 2018-09-11 ENCOUNTER — Encounter: Payer: Self-pay | Admitting: Cardiology

## 2018-09-11 ENCOUNTER — Other Ambulatory Visit: Payer: Self-pay

## 2018-09-11 VITALS — BP 142/52 | HR 77 | Temp 98.2°F | Ht 73.0 in | Wt 197.0 lb

## 2018-09-11 DIAGNOSIS — I25118 Atherosclerotic heart disease of native coronary artery with other forms of angina pectoris: Secondary | ICD-10-CM | POA: Diagnosis not present

## 2018-09-11 DIAGNOSIS — I480 Paroxysmal atrial fibrillation: Secondary | ICD-10-CM

## 2018-09-11 DIAGNOSIS — E785 Hyperlipidemia, unspecified: Secondary | ICD-10-CM

## 2018-09-11 DIAGNOSIS — I10 Essential (primary) hypertension: Secondary | ICD-10-CM

## 2018-09-11 NOTE — Patient Instructions (Addendum)
Continue your current therapy  Follow up in 6 months   

## 2018-10-30 ENCOUNTER — Other Ambulatory Visit: Payer: Self-pay | Admitting: Internal Medicine

## 2018-10-30 DIAGNOSIS — Z720 Tobacco use: Secondary | ICD-10-CM

## 2019-03-20 ENCOUNTER — Encounter: Payer: Self-pay | Admitting: Physician Assistant

## 2019-03-20 ENCOUNTER — Telehealth: Payer: Self-pay

## 2019-03-20 ENCOUNTER — Telehealth (INDEPENDENT_AMBULATORY_CARE_PROVIDER_SITE_OTHER): Payer: Medicare Other | Admitting: Physician Assistant

## 2019-03-20 VITALS — BP 136/77 | HR 76 | Ht 73.0 in | Wt 210.0 lb

## 2019-03-20 DIAGNOSIS — I2510 Atherosclerotic heart disease of native coronary artery without angina pectoris: Secondary | ICD-10-CM

## 2019-03-20 DIAGNOSIS — I480 Paroxysmal atrial fibrillation: Secondary | ICD-10-CM

## 2019-03-20 DIAGNOSIS — I10 Essential (primary) hypertension: Secondary | ICD-10-CM

## 2019-03-20 DIAGNOSIS — E785 Hyperlipidemia, unspecified: Secondary | ICD-10-CM

## 2019-03-20 NOTE — Telephone Encounter (Signed)
Contacted patient to discuss AVS Instructions. Gave patient Hao's recommendations from today's virtual office visit. Informed patient that someone from the scheduling dept will be in contact with them to schedule their follow up appt. Patient voiced understanding and AVS mailed.    

## 2019-03-20 NOTE — Progress Notes (Signed)
Virtual Visit via Telephone Note   This visit type was conducted due to national recommendations for restrictions regarding the COVID-19 Pandemic (e.g. social distancing) in an effort to limit this patient's exposure and mitigate transmission in our community.  Due to his co-morbid illnesses, this patient is at least at moderate risk for complications without adequate follow up.  This format is felt to be most appropriate for this patient at this time.  The patient did not have access to video technology/had technical difficulties with video requiring transitioning to audio format only (telephone).  All issues noted in this document were discussed and addressed.  No physical exam could be performed with this format.  Please refer to the patient's chart for his  consent to telehealth for [facility].   Date:  03/21/2019   ID:  Troy Jackson, DOB Feb 16, 1946, MRN [id number]  Patient Location: Home Provider Location: Home  PCP:  Rodrigo Ran, MD  Cardiologist:  Peter Swaziland, MD  Electrophysiologist:  None   Evaluation Performed:  Follow-Up Visit  Chief Complaint:  followup  History of Present Illness:    Troy Jackson is a 74 y.o. male with CAD, COPD, PAF, HTN, HLD, tobacco abuse and history of GERD.  Cardiac catheterization in 2017 showed 90% proximal to mid left circumflex artery stenosis treated with DES.  Patient was placed on aspirin, Plavix and Vytorin.  Beta-blocker was held due to bradycardia.  He was intolerant of Brilinta due to dyspnea.  He was admitted with pneumonia in May 2019 and acute respiratory failure.  He went into atrial fibrillation with RVR.  Fortunately, he self converted on IV Cardizem.  Antiplatelet medication was stopped and he was discharged home on Eliquis.  Patient was readmitted later in May 2019 and had recurrence of atrial fibrillation.  He self converted on IV Cardizem and amiodarone.  He was discharged on Cardizem alone as we did not wish to commit to  antiarrhythmic therapy yet.  Patient presents today for virtual visit today.  He has not played golf in the recent weeks due to the cold.  Otherwise he denies any exertional chest pain or shortness of breath.  He has no significant palpitation.  His last set of lab work from August 2020 has been reviewed, LDL was elevated at 101.  He is due for repeat lab work next month.  For some reason, he is on low-dose Eliquis whereas he technically should be on the regular dose of Eliquis.  He says this was reviewed with Dr. Swaziland in the past, I will double check with Dr. Swaziland to see if he should be on the regular dose.  Otherwise he can follow-up in 6 months.  The patient does not have symptoms concerning for COVID-19 infection (fever, chills, cough, or new shortness of breath).    Past Medical History:  Diagnosis Date  . Anxiety   . COPD (chronic obstructive pulmonary disease) (HCC)   . Coronary artery disease 2017   stent  . Diverticulitis   . Dyspnea    doe, with exertion  . Dysrhythmia    atrial fibrillation  . GERD (gastroesophageal reflux disease)   . Hyperlipidemia   . Hypertension   . Pneumonia    2019  . Tobacco abuse 2017   Past Surgical History:  Procedure Laterality Date  . CARDIAC CATHETERIZATION N/A 02/03/2016   Procedure: Left Heart Cath and Coronary Angiography;  Surgeon: Peter M Swaziland, MD;  Location: [facility] INVASIVE CV LAB;  Service: Cardiovascular;  Laterality: N/A;  . CARDIAC CATHETERIZATION N/A 02/03/2016   Procedure: Coronary Stent Intervention;  Surgeon: Peter M Martinique, MD;  Location: [place] CV LAB;  Service: Cardiovascular;  Laterality: N/A;  . COLONOSCOPY    . CORONARY ANGIOPLASTY WITH STENT PLACEMENT  02/03/2016  . SHOULDER SURGERY Right    as young adult  . SHOULDER SURGERY Left    as child  . SPINAL CORD STIMULATOR INSERTION N/A 07/25/2018   Procedure: LUMBAR SPINAL CORD STIMULATOR INSERTION;  Surgeon: Melina Schools, MD;  Location: [place];  Service:  Orthopedics;  Laterality: N/A;  2 hrs     Current Meds  Medication Sig  . albuterol (PROVENTIL HFA;VENTOLIN HFA) 108 (90 Base) MCG/ACT inhaler Inhale 2 puffs into the lungs every 6 (six) hours as needed for wheezing or shortness of breath.   Marland Kitchen albuterol (PROVENTIL) (2.5 MG/3ML) 0.083% nebulizer solution Take 2.5 mg by nebulization 2 (two) times a day.  . ALPRAZolam (XANAX) 0.5 MG tablet Take 0.5 mg by mouth 3 (three) times daily.   Marland Kitchen apixaban (ELIQUIS) 2.5 MG TABS tablet Take 2.5 mg by mouth 2 (two) times daily.  . budesonide-formoterol (SYMBICORT) 80-4.5 MCG/ACT inhaler Inhale 2 puffs into the lungs 2 (two) times daily.   . Cholecalciferol (VITAMIN D3) 50 MCG (2000 UT) capsule Take 2,000 Units by mouth daily.  Marland Kitchen dicyclomine (BENTYL) 10 MG capsule Take 10 mg by mouth every 8 (eight) hours as needed for spasms.   Marland Kitchen diltiazem (CARDIZEM CD) 240 MG 24 hr capsule Take 1 capsule (240 mg total) by mouth daily.  . DULoxetine (CYMBALTA) 60 MG capsule Take 60 mg by mouth daily.   Marland Kitchen ezetimibe (ZETIA) 10 MG tablet Take 10 mg by mouth daily.  . Menthol, Topical Analgesic, (ZIMS MAX-FREEZE EX) Apply 1 application topically daily as needed (muscle pain).  . metoprolol tartrate (LOPRESSOR) 25 MG tablet Take 0.5 tablets (12.5 mg total) by mouth 2 (two) times daily as needed (as needed fOR HR> 120/min.). (Patient taking differently: Take 25 mg by mouth 2 (two) times daily as needed (as needed fOR HR> 120/min.). )  . nitroGLYCERIN (NITROSTAT) 0.4 MG SL tablet Place 1 tablet (0.4 mg total) under the tongue every 5 (five) minutes as needed for chest pain.  Marland Kitchen ondansetron (ZOFRAN) 4 MG tablet Take 1 tablet (4 mg total) by mouth every 8 (eight) hours as needed for nausea or vomiting.  . pantoprazole (PROTONIX) 40 MG tablet Take 1 tablet (40 mg total) by mouth daily. (Patient taking differently: Take 40 mg by mouth at bedtime. )  . simvastatin (ZOCOR) 20 MG tablet Take 20 mg by mouth daily.  . valsartan (DIOVAN) 80 MG  tablet Take 80 mg by mouth at bedtime.     Allergies:   Statins   Social History   Tobacco Use  . Smoking status: Former Smoker    Packs/day: 1.00    Years: 60.00    Pack years: 60.00    Types: Cigarettes    Quit date: 2017    Years since quitting: 4.0  . Smokeless tobacco: Never Used  Substance Use Topics  . Alcohol use: Not Currently    Alcohol/week: 20.0 standard drinks    Types: 20 Cans of beer per week    Comment: cannot remember when his last day was without a drink  . Drug use: No     Family Hx: The patient's family history includes Alcohol abuse in his brother, father, and paternal grandfather; Alzheimer's disease in his mother; Heart attack  in his father and paternal grandfather; Heart disease in his father; Lung cancer in his father.  ROS:   Please see the history of present illness.     All other systems reviewed and are negative.   Prior CV studies:   The following studies were reviewed today:  Echo 07/27/2017 LV EF: 60% -   65%  ------------------------------------------------------------------- Indications:      Atrial fibrillation - 427.31.  ------------------------------------------------------------------- History:   PMH:   Coronary artery disease.  Chronic obstructive pulmonary disease.  Risk factors:  Sepsis. Dyslipidemia.  ------------------------------------------------------------------- Study Conclusions  - Left ventricle: The cavity size was normal. Posterior wall   thickness was increased in a pattern of mild LVH. Systolic   function was normal. The estimated ejection fraction was in the   range of 60% to 65%. Wall motion was normal; there were no   regional wall motion abnormalities. - Aortic valve: Transvalvular velocity was within the normal range.   There was no stenosis. There was no regurgitation. - Mitral valve: Transvalvular velocity was within the normal range.   There was no evidence for stenosis. There was no  regurgitation. - Right ventricle: The cavity size was normal. Wall thickness was   normal. Systolic function was normal. - Tricuspid valve: There was no regurgitation.  Labs/Other Tests and Data Reviewed:    EKG:  An ECG dated 09/11/2018 was personally reviewed today and demonstrated:  Normal sinus rhythm without significant ST-T wave changes  Recent Labs: 07/23/2018: BUN 24; Creatinine, Ser 1.16; Hemoglobin 13.9; Platelets 261; Potassium 4.3; Sodium 137   Recent Lipid Panel Lab Results  Component Value Date/Time   CHOL 195 02/03/2016 10:05 AM   TRIG 100 02/03/2016 10:05 AM   HDL 41 02/03/2016 10:05 AM   CHOLHDL 4.8 02/03/2016 10:05 AM   LDLCALC 134 (H) 02/03/2016 10:05 AM    Wt Readings from Last 3 Encounters:  03/20/19 210 lb (95.3 kg)  09/11/18 197 lb (89.4 kg)  07/25/18 206 lb 2.1 oz (93.5 kg)     Objective:    Vital Signs:  BP 136/77   Pulse 76   Ht 6\' 1"  (1.854 m)   Wt 210 lb (95.3 kg)   BMI 27.71 kg/m    VITAL SIGNS:  reviewed  ASSESSMENT & PLAN:    1. CAD: Denies any recent chest pain.  Patient continue to play golf without any exertional symptoms.  Not on aspirin given the need for Eliquis  2. PAF: On Eliquis and the diltiazem.  3. Hypertension: Blood pressure well controlled  4. Hyperlipidemia: On Zetia and Zocor.  LDL was elevated on the last check, due for lipid panel next week.  COVID-19 Education: The signs and symptoms of COVID-19 were discussed with the patient and how to seek care for testing (follow up with PCP or arrange E-visit).  The importance of social distancing was discussed today.  Time:   Today, I have spent 14 minutes with the patient with telehealth technology discussing the above problems.     Medication Adjustments/Labs and Tests Ordered: Current medicines are reviewed at length with the patient today.  Concerns regarding medicines are outlined above.   Tests Ordered: No orders of the defined types were placed in this encounter.    Medication Changes: No orders of the defined types were placed in this encounter.   Follow Up:  Either In Person or Virtual in 6 month(s)  Signed, , Azalee Course  03/21/2019 10:59 PM    [facility]  [facility]  

## 2019-03-20 NOTE — Patient Instructions (Addendum)
Medication Instructions:  Your physician recommends that you continue on your current medications as directed. Please refer to the Current Medication list given to you today. *If you need a refill on your cardiac medications before your next appointment, please call your pharmacy*  Lab Work: None If you have labs (blood work) drawn today and your tests are completely normal, you will receive your results only by: Marland Kitchen MyChart Message (if you have MyChart) OR . A paper copy in the mail If you have any lab test that is abnormal or we need to change your treatment, we will call you to review the results.  Testing/Procedures: None   Follow-Up: At [facility], you and your health needs are our priority.  As part of our continuing mission to provide you with exceptional heart care, we have created designated Provider Care Teams.  These Care Teams include your primary Cardiologist (physician) and Advanced Practice Providers (APPs -  Physician Assistants and Nurse Practitioners) who all work together to provide you with the care you need, when you need it.  Your next appointment:   6 month(s)  The format for your next appointment:   In Person  Provider:   Peter Swaziland, MD  Other Instructions ELIQUIS SAMPLES PLACED UP FRONT

## 2019-03-20 NOTE — Telephone Encounter (Signed)

## 2019-06-11 IMAGING — RF THORACIC SPINE 2 VIEWS
1 series · 3 of 3 positions shown · non-contrast
Comparison: Chest radiography 07/23/2018

CLINICAL DATA: Neurostimulator placement.

EXAM:
DG C-ARM 61-120 MIN; THORACIC SPINE 2 VIEWS

[Series 1: run · 3 of 3 slices shown]
[im 1/3]
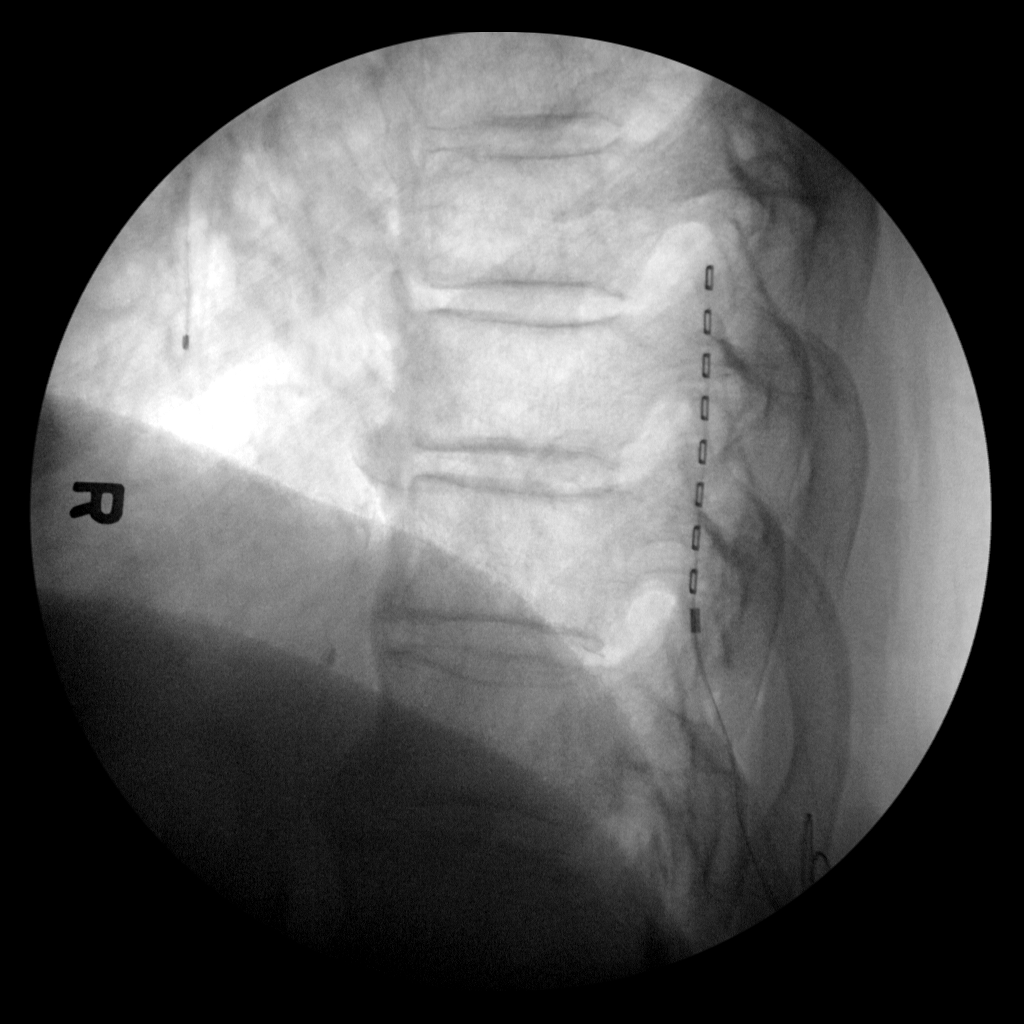
[im 2/3]
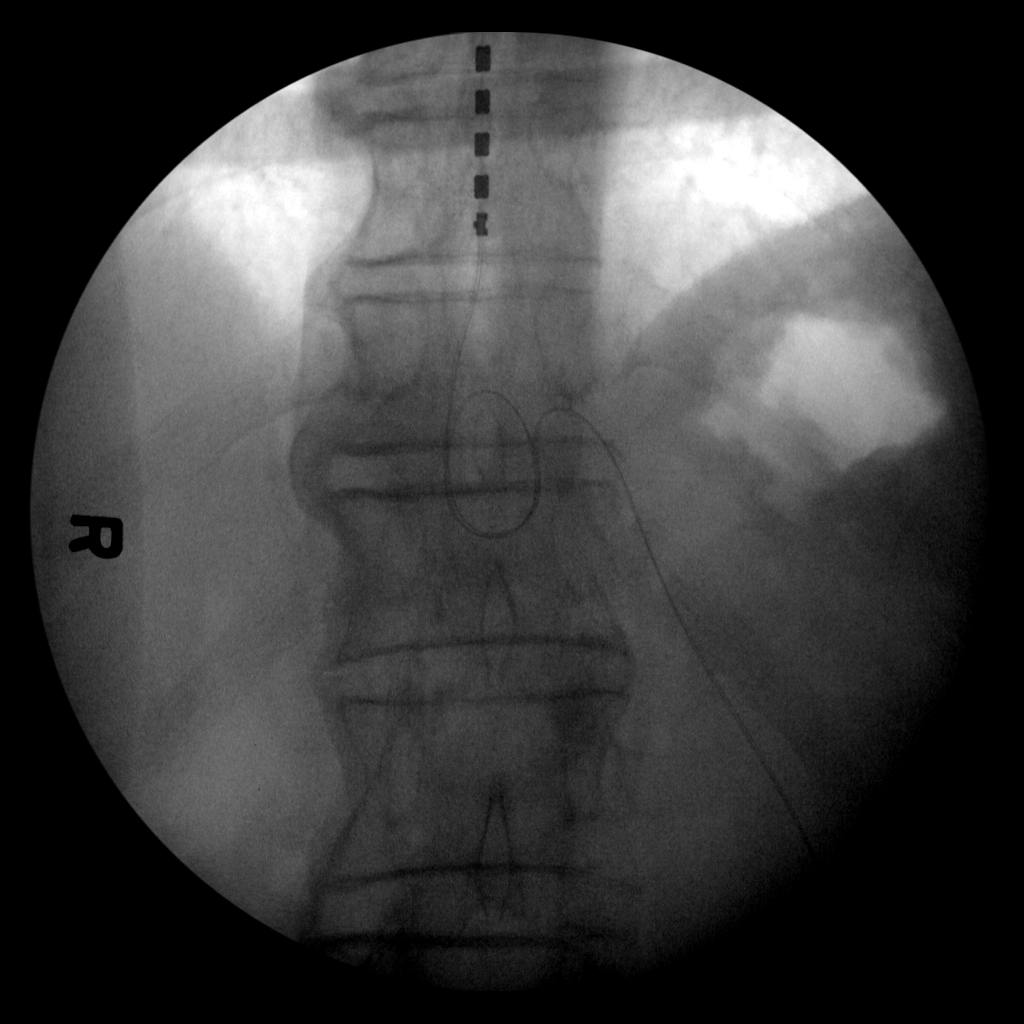
[im 3/3]
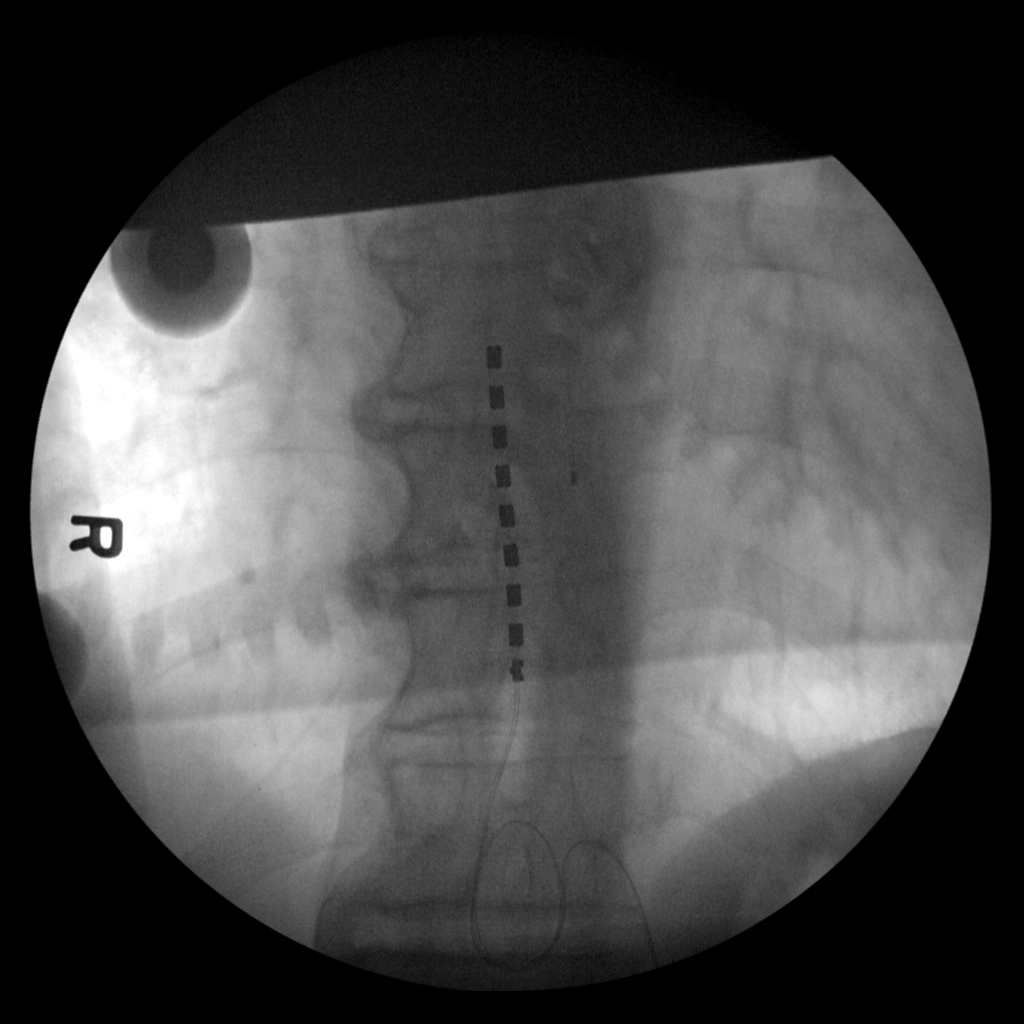

[3 of 3 positions shown; findings below may reference images not displayed]

FINDINGS: Three C-arm images show placement of a lower thoracic
neurostimulator in the dorsal spinal canal from the inferior
endplate of T8 to the lower portion of T10. No visible complicating
feature.
IMPRESSION: Thoracic neurostimulator T8-T10.

## 2019-11-11 NOTE — Progress Notes (Signed)
Cardiology Office Note    Date:  11/14/2019   ID:  Troy Jackson, Troy Jackson 22-Jan-1946, MRN [id number]  PCP:  Rodrigo Ran, MD  Cardiologist:  Nonnie Pickney Swaziland, MD    History of Present Illness:  Troy Jackson is a 74 y.o. male seen for follow up CAD. He has a history of HLD, tobacco abuse, COPD, and GERD. He had remote stress test in 2004 that was normal. He was admitted in November 2017 after presenting with severe chest pain. Troponin was mildly elevated at .06. Echo showed normal LV function. Cardiac cath showed 90% proximal to mid LCx stenosis treated successfully with DES. Discharged on ASA, Plavix, Vytorin. Beta blocker held due to bradycardia. He was intolerant of Brilinta due to dyspnea.   He was admitted 5/13-5/17/19 with PNA, sepsis, and acute respiratory failure. He went into Afib with RVR. He was started on IV Cardizem and converted to NSR. Troponin levels were normal. Echo looked OK. DC home on Eliquis. Antiplatelet therapy stopped. He was readmitted on 07/31/17 with chest pain and was back in AFib. Converted after IV cardizem and amiodarone. DC on Cardizem alone. Did not want to commit to AAD therapy yet.   In May 2020 he underwent implantation of lumbar spinal cord stimulator for chronic back pain.   On follow up today he is doing very well from a cardiac standpoint.  He still has significant back pain.  He is abstaining from alcohol and tobacco completely.    No dyspnea, cough, chest pain. No edema. No symptomatic Afib. The highest HR up to 100. He is very active. Still working for the [employer] as Psychologist, occupational. Does a lot of strenuous work at home.  Past Medical History:  Diagnosis Date  . Anxiety   . COPD (chronic obstructive pulmonary disease) (HCC)   . Coronary artery disease 2017   stent  . Diverticulitis   . Dyspnea    doe, with exertion  . Dysrhythmia    atrial fibrillation  . GERD (gastroesophageal reflux disease)   . Hyperlipidemia   . Hypertension   .  Pneumonia    2019  . Tobacco abuse 2017    Past Surgical History:  Procedure Laterality Date  . CARDIAC CATHETERIZATION N/A 02/03/2016   Procedure: Left Heart Cath and Coronary Angiography;  Surgeon: Melitza Metheny M Swaziland, MD;  Location: [facility] INVASIVE CV LAB;  Service: Cardiovascular;  Laterality: N/A;  . CARDIAC CATHETERIZATION N/A 02/03/2016   Procedure: Coronary Stent Intervention;  Surgeon: Jessel Gettinger M Swaziland, MD;  Location: [facility] INVASIVE CV LAB;  Service: Cardiovascular;  Laterality: N/A;  . COLONOSCOPY    . CORONARY ANGIOPLASTY WITH STENT PLACEMENT  02/03/2016  . SHOULDER SURGERY Right    as young adult  . SHOULDER SURGERY Left    as child  . SPINAL CORD STIMULATOR INSERTION N/A 07/25/2018   Procedure: LUMBAR SPINAL CORD STIMULATOR INSERTION;  Surgeon: Venita Lick, MD;  Location: [facility] OR;  Service: Orthopedics;  Laterality: N/A;  2 hrs    Current Medications: Outpatient Medications Prior to Visit  Medication Sig Dispense Refill  . albuterol (PROVENTIL HFA;VENTOLIN HFA) 108 (90 Base) MCG/ACT inhaler Inhale 2 puffs into the lungs every 6 (six) hours as needed for wheezing or shortness of breath.     Marland Kitchen albuterol (PROVENTIL) (2.5 MG/3ML) 0.083% nebulizer solution Take 2.5 mg by nebulization 2 (two) times a day.    . ALPRAZolam (XANAX) 0.5 MG tablet Take 0.5 mg by mouth 3 (three) times daily.     Marland Kitchen  apixaban (ELIQUIS) 2.5 MG TABS tablet Take 2.5 mg by mouth 2 (two) times daily.    . budesonide-formoterol (SYMBICORT) 80-4.5 MCG/ACT inhaler Inhale 2 puffs into the lungs 2 (two) times daily.     . Cholecalciferol (VITAMIN D3) 50 MCG (2000 UT) capsule Take 2,000 Units by mouth daily.    Marland Kitchen. dicyclomine (BENTYL) 10 MG capsule Take 10 mg by mouth every 8 (eight) hours as needed for spasms.     Marland Kitchen. diltiazem (CARDIZEM CD) 240 MG 24 hr capsule Take 1 capsule (240 mg total) by mouth daily. 30 capsule 1  . DULoxetine (CYMBALTA) 60 MG capsule Take 60 mg by mouth daily.     Marland Kitchen. ezetimibe (ZETIA) 10 MG tablet Take 10  mg by mouth daily.    . Menthol, Topical Analgesic, (ZIMS MAX-FREEZE EX) Apply 1 application topically daily as needed (muscle pain).    . nitroGLYCERIN (NITROSTAT) 0.4 MG SL tablet Place 1 tablet (0.4 mg total) under the tongue every 5 (five) minutes as needed for chest pain. 30 tablet 2  . ondansetron (ZOFRAN) 4 MG tablet Take 1 tablet (4 mg total) by mouth every 8 (eight) hours as needed for nausea or vomiting. 20 tablet 0  . pantoprazole (PROTONIX) 40 MG tablet Take 1 tablet (40 mg total) by mouth daily. (Patient taking differently: Take 40 mg by mouth at bedtime. ) 30 tablet 0  . simvastatin (ZOCOR) 20 MG tablet Take 20 mg by mouth daily.    . valsartan (DIOVAN) 80 MG tablet Take 80 mg by mouth at bedtime.    . metoprolol tartrate (LOPRESSOR) 25 MG tablet Take 0.5 tablets (12.5 mg total) by mouth 2 (two) times daily as needed (as needed fOR HR> 120/min.). (Patient taking differently: Take 25 mg by mouth 2 (two) times daily as needed (as needed fOR HR> 120/min.). ) 60 tablet 0   No facility-administered medications prior to visit.     Allergies:   Statins   Social History   Socioeconomic History  . Marital status: Married    Spouse name: Not on file  . Number of children: 2  . Years of education: Not on file  . Highest education level: Not on file  Occupational History  . Occupation: retired Sales promotion account executiveplumbing; works as a Research officer, trade unionfirefighter  Tobacco Use  . Smoking status: Former Smoker    Packs/day: 1.00    Years: 60.00    Pack years: 60.00    Types: Cigarettes    Quit date: 2017    Years since quitting: 4.6  . Smokeless tobacco: Never Used  Vaping Use  . Vaping Use: Never used  Substance and Sexual Activity  . Alcohol use: Not Currently    Alcohol/week: 20.0 standard drinks    Types: 20 Cans of beer per week    Comment: cannot remember when his last day was without a drink  . Drug use: No  . Sexual activity: Not on file  Other Topics Concern  . Not on file  Social History Narrative    . Not on file   Social Determinants of Health   Financial Resource Strain:   . Difficulty of Paying Living Expenses: Not on file  Food Insecurity:   . Worried About Programme researcher, broadcasting/film/videounning Out of Food in the Last Year: Not on file  . Ran Out of Food in the Last Year: Not on file  Transportation Needs:   . Lack of Transportation (Medical): Not on file  . Lack of Transportation (Non-Medical): Not on file  Physical  Activity:   . Days of Exercise per Week: Not on file  . Minutes of Exercise per Session: Not on file  Stress:   . Feeling of Stress : Not on file  Social Connections:   . Frequency of Communication with Friends and Family: Not on file  . Frequency of Social Gatherings with Friends and Family: Not on file  . Attends Religious Services: Not on file  . Active Member of Clubs or Organizations: Not on file  . Attends Banker Meetings: Not on file  . Marital Status: Not on file     Family History:  The patient's family history includes Alcohol abuse in his brother, father, and paternal grandfather; Alzheimer's disease in his mother; Heart attack in his father and paternal grandfather; Heart disease in his father; Lung cancer in his father.   ROS:   Please see the history of present illness.    ROS All other systems reviewed and are negative.   PHYSICAL EXAM:   VS:  BP (!) 144/62   Pulse 67   Ht 6\' 1"  (1.854 m)   Wt 214 lb 3.2 oz (97.2 kg)   SpO2 96%   BMI 28.26 kg/m    GENERAL:  Well appearing WM in NAD HEENT:  PERRL, EOMI, sclera are clear. Oropharynx is clear. NECK:  No jugular venous distention, carotid upstroke brisk and symmetric, no bruits, no thyromegaly or adenopathy LUNGS:  Clear to auscultation bilaterally CHEST:  Unremarkable HEART:  RRR,  PMI not displaced or sustained,S1 and S2 within normal limits, no S3, no S4: no clicks, no rubs, no murmurs ABD:  Soft, nontender. BS +, no masses or bruits. No hepatomegaly, no splenomegaly EXT:  2 + pulses throughout,  no edema, no cyanosis no clubbing SKIN:  Warm and dry.  No rashes NEURO:  Alert and oriented x 3. Cranial nerves II through XII intact. PSYCH:  Cognitively intact    Wt Readings from Last 3 Encounters:  11/14/19 214 lb 3.2 oz (97.2 kg)  03/20/19 210 lb (95.3 kg)  09/11/18 197 lb (89.4 kg)      Studies/Labs Reviewed:   EKG:  EKG is  ordered today.  NSR rate 67. Normal Ecg. I have personally reviewed and interpreted this study.   Recent Labs: No results found for requested labs within last 8760 hours.   Lipid Panel    Component Value Date/Time   CHOL 195 02/03/2016 1005   TRIG 100 02/03/2016 1005   HDL 41 02/03/2016 1005   CHOLHDL 4.8 02/03/2016 1005   VLDL 20 02/03/2016 1005   LDLCALC 134 (H) 02/03/2016 1005   Dated 10/18/18: cholesterol 161, triglycerides 96, HDL 41, LDL 101. Glucose 120, otherwise CMET, CBC, TFTs normal.  Echo: 07/27/17: Study Conclusions  - Left ventricle: The cavity size was normal. Posterior wall   thickness was increased in a pattern of mild LVH. Systolic   function was normal. The estimated ejection fraction was in the   range of 60% to 65%. Wall motion was normal; there were no   regional wall motion abnormalities. - Aortic valve: Transvalvular velocity was within the normal range.   There was no stenosis. There was no regurgitation. - Mitral valve: Transvalvular velocity was within the normal range.   There was no evidence for stenosis. There was no regurgitation. - Right ventricle: The cavity size was normal. Wall thickness was   normal. Systolic function was normal. - Tricuspid valve: There was no regurgitation.    ASSESSMENT:  1. Coronary artery disease involving native coronary artery of native heart without angina pectoris   2. PAF (paroxysmal atrial fibrillation) (HCC)   3. Essential hypertension   4. Hyperlipidemia LDL goal <70      PLAN:  In order of problems listed above:  1. CAD with history of NSTEMI, s/p stenting of  the LCx in November 2017 with DES.  He is asymptomatic. Not on antiplatelet therapy since he is on Eliquis. History of bradycardia on beta blocker.  2. Atrial fibrillation paroxysmal. In setting of acute PNA.  Will continue rate control with Cardizem. Continue Eliquis.   3. Hyperlipidemia:  intolerant to Crestor. On low dose Zocor and Zetia. He has lab work upcoming with Dr Waynard Edwards. We discussed LDL goal of < 70 with history of CAD. Next option would be to consider a PCSK 9 inhibitor. He would be willing to consider this if insurance covers.   4. Tobacco use: now quit.    5. HTN controlled.   I will follow up in one year  Medication Adjustments/Labs and Tests Ordered: Current medicines are reviewed at length with the patient today.  Concerns regarding medicines are outlined above.  Medication changes, Labs and Tests ordered today are listed in the Patient Instructions below. There are no Patient Instructions on file for this visit.   Signed, Hutchinson Isenberg Swaziland, MD  11/14/2019 11:14 AM    [facility] [address] [place], [place], [address] [phone]

## 2019-11-14 ENCOUNTER — Other Ambulatory Visit: Payer: Self-pay

## 2019-11-14 ENCOUNTER — Ambulatory Visit: Payer: Medicare Other | Admitting: Cardiology

## 2019-11-14 ENCOUNTER — Encounter: Payer: Self-pay | Admitting: Cardiology

## 2019-11-14 VITALS — BP 144/62 | HR 67 | Ht 73.0 in | Wt 214.2 lb

## 2019-11-14 DIAGNOSIS — I10 Essential (primary) hypertension: Secondary | ICD-10-CM | POA: Diagnosis not present

## 2019-11-14 DIAGNOSIS — E785 Hyperlipidemia, unspecified: Secondary | ICD-10-CM | POA: Diagnosis not present

## 2019-11-14 DIAGNOSIS — I480 Paroxysmal atrial fibrillation: Secondary | ICD-10-CM

## 2019-11-14 DIAGNOSIS — I2510 Atherosclerotic heart disease of native coronary artery without angina pectoris: Secondary | ICD-10-CM | POA: Diagnosis not present

## 2019-11-28 ENCOUNTER — Telehealth: Payer: Self-pay | Admitting: Cardiology

## 2019-11-28 NOTE — Telephone Encounter (Signed)
Sample placed at front desk for pick up. Pt verbalized understanding.  Eliquis 2.5 mg Qty: 1 box Lot #: [id number] Exp: 10/22

## 2019-11-28 NOTE — Telephone Encounter (Signed)
Patient calling the office for samples of medication:   1.  What medication and dosage are you requesting samples for? apixaban (ELIQUIS) 2.5 MG TABS tablet  2.  Are you currently out of this medication? No   Patients wife states that he is in the donut hole and is now costing him $166/month for this medication

## 2022-09-02 NOTE — Progress Notes (Signed)
 "   Patient ID: Troy Jackson is a 77 y.o. (DOB Mar 04, 1946) male  Primary Care Provider : Oneil DELENA Neth, MD [facility] GI Provider:  Dr. Romilda, MD     Assessment: 1. Gastroesophageal reflux disease, unspecified whether esophagitis present     Plan:  Troy Jackson is a 77 y.o. male with a past medical history of a fib with RVR, CAD, NSTEMI, HLD and COPD seen today for medication refill.  Troy Jackson reports today for medication refill of Protonix  20 mg daily for GERD.  I am refilling this x 1 year.  He would also like me to refill his dicyclomine  10 mg before meals and at bedtime as this is helpful for his heartburn.  I am also refilling this x 1 year.  He is otherwise doing well we will plan to see him back next year with his new primary GI, Dr. Mixon.    No orders of the defined types were placed in this encounter.  No follow-ups on file.      Risks, benefits, and alternatives of the medications and treatment plan prescribed today were discussed, and patient expressed understanding. Plan follow-up as discussed or as needed if any worsening symptoms or change in condition.  Please excuse any typos as this was dictated with a voice recognition device    Chief Complaint: medication refill   HPI Troy Jackson is a 77 y.o. male with a past medical history of a fib with RVR, CAD, NSTEMI, HLD and COPD seen today for medication refill.  Patient last seen in June 2023.  At that time he reported for refill of Protonix  20 mg daily.  He was doing well on this medication and he was provided refills x 1 year.  Patient reports today for refill of Protonix  40 mg daily for GERD symptoms.  He reports this medication continues to work well.  He occasionally has heartburn symptoms which responds well to dicyclomine .  He denies nausea, vomiting, dysphagia, or hematemesis.  He reports that he is moving his bowels well daily without constipation or diarrhea.  He denies melena, hematochezia, or rectal pain.  He  reports that he occasionally has lower abdominal cramping.  He reports that he has a good appetite and his weight has been stable.  Past GI History:  Colonoscopy 04/2020 - Diverticulosis in the sigmoid colon and in the descending colon. Two 2 to 3 mm TA polyps in the cecum. Recall 5 years Colonoscopy 05/09/2014 - Diverticulosis in the sigmoid colon and in the descending colon. Internal hemorrhoids. One 4 mm TA polyp in the ascending colon. One 7 mm TA polyp in the descending colon. Recall 5 years.  Past Medical History:  Diagnosis Date   Colon polyp    Diverticulitis    Diverticulosis    GERD (gastroesophageal reflux disease)     Past Surgical History:  Procedure Laterality Date   Colonoscopy  07-30-2010   Colonoscopy  04/2014   Rotator cuff repair     bilateral    Family and Social History:   family history includes Colon cancer in his maternal uncle; Endometrial cancer (age of onset: 55) in his mother.   reports that he has quit smoking. His smoking use included cigarettes. He smoked an average of .5 packs per day. He has been exposed to tobacco smoke. He has quit using smokeless tobacco.  His smokeless tobacco use included snuff. He reports that he does not currently use alcohol. He reports that he does not use drugs.  Review of Systems:  Review of Systems  Constitutional:  Negative for appetite change and unexpected weight change.  HENT:  Negative for trouble swallowing.   Gastrointestinal:  Positive for abdominal pain. Negative for abdominal distention, anal bleeding, blood in stool, constipation, diarrhea, nausea, rectal pain and vomiting.  Skin:  Negative for rash.     Physical Exam:  BP 147/70   Pulse 66   Temp 97.5 F (36.4 C) (Temporal)   Ht 6' 1 (1.854 m)   Wt 193 lb 12.8 oz (87.9 kg)   BMI 25.57 kg/m    Physical Exam Vitals reviewed.  Constitutional:      General: He is not in acute distress.    Appearance: He is well-developed.  Cardiovascular:      Rate and Rhythm: Normal rate and regular rhythm.     Heart sounds: Normal heart sounds.  Pulmonary:     Effort: Pulmonary effort is normal. No respiratory distress.     Breath sounds: Normal breath sounds.  Abdominal:     General: Bowel sounds are normal. There is no distension.     Palpations: Abdomen is soft.     Tenderness: There is no abdominal tenderness.  Neurological:     Mental Status: He is alert and oriented to person, place, and time.       Patient's Medications       * Accurate as of September 02, 2022  1:38 PM. Reflects encounter med changes as of last refresh          Continued Medications      Instructions  ADVAIR HFA IN  1 puff, 2 times a day   ALPRAZolam  0.5 mg tablet Commonly known as: XANAX   2 times a day   apixaban  5 mg tablet Commonly known as: ELIQUIS   5 mg, Oral, 2 times a day   BLUE-EMU SUPER STRENGTH Crea  Topical   budesonide -formoterol  160-4.5 mcg/actuation inhaler Commonly known as: SYMBICORT  Inhalation   cholecalciferol  50 mcg (2000 UT) Caps capsule Commonly known as: VITAMIN D3  Oral   diltiazem  240 MG 24 hr capsule Commonly known as: TIAZAC   240 mg, Oral, Daily   ezetimibe  10 MG tablet Commonly known as: ZETIA   10 mg, Oral   ezetimibe -simvastatin  10-20 MG per tablet Commonly known as: VYTORIN   1 tablet, Oral, 3 times weekly   metoprolol  tartrate 25 mg tablet Commonly known as: LOPRESSOR   12.5 mg, Oral   nitroGLYCERIN  0.4 mg SL tablet Commonly known as: NITROSTAT   0.4 mg, Sublingual   ondansetron  4 mg tablet Commonly known as: ZOFRAN   TAKE 1 TABLET BY MOUTH EVERY 8 HOURS AS NEEDED FOR NAUSEA OR VOMITING   pantoprazole  sodium 20 mg tablet Commonly known as: PROTONIX   20 mg, Oral, Daily   PROAIR  HFA IN  2 puffs, 2 times a day   simvastatin  20 mg tablet Commonly known as: ZOCOR   20 mg, Oral, At bedtime       Modified Medications      Instructions  dicyclomine  10 mg capsule Commonly known as:  BENTYL  What changed: when to take this Changed by: Rosina Hasten, FNP  10 mg, Oral, 30 minutes before meals & at bedtime       Discontinued Medications    CYMBALTA  30 mg capsule Generic drug: DULoxetine  HCl Stopped by: Rosina Hasten, FNP   DIOVAN 160 MG tablet Generic drug: valsartan Stopped by: Rosina Hasten, FNP   valsartan 80 MG tablet Commonly known as: DIOVAN Stopped by: Rosina Hasten, FNP                                      *  Some images could not be shown."

## 2024-04-06 ENCOUNTER — Inpatient Hospital Stay (HOSPITAL_COMMUNITY)
Admission: EM | Admit: 2024-04-06 | Discharge: 2024-04-15 | DRG: 193 | Disposition: A | Attending: Internal Medicine | Admitting: Internal Medicine

## 2024-04-06 ENCOUNTER — Emergency Department (HOSPITAL_COMMUNITY)

## 2024-04-06 DIAGNOSIS — J189 Pneumonia, unspecified organism: Principal | ICD-10-CM | POA: Diagnosis present

## 2024-04-06 DIAGNOSIS — I5033 Acute on chronic diastolic (congestive) heart failure: Secondary | ICD-10-CM | POA: Diagnosis present

## 2024-04-06 DIAGNOSIS — Z7951 Long term (current) use of inhaled steroids: Secondary | ICD-10-CM

## 2024-04-06 DIAGNOSIS — I252 Old myocardial infarction: Secondary | ICD-10-CM

## 2024-04-06 DIAGNOSIS — I110 Hypertensive heart disease with heart failure: Secondary | ICD-10-CM | POA: Diagnosis present

## 2024-04-06 DIAGNOSIS — J449 Chronic obstructive pulmonary disease, unspecified: Secondary | ICD-10-CM | POA: Diagnosis present

## 2024-04-06 DIAGNOSIS — J440 Chronic obstructive pulmonary disease with acute lower respiratory infection: Secondary | ICD-10-CM | POA: Diagnosis present

## 2024-04-06 DIAGNOSIS — R627 Adult failure to thrive: Secondary | ICD-10-CM | POA: Diagnosis present

## 2024-04-06 DIAGNOSIS — Z1152 Encounter for screening for COVID-19: Secondary | ICD-10-CM

## 2024-04-06 DIAGNOSIS — Z79899 Other long term (current) drug therapy: Secondary | ICD-10-CM

## 2024-04-06 DIAGNOSIS — J9601 Acute respiratory failure with hypoxia: Secondary | ICD-10-CM | POA: Diagnosis present

## 2024-04-06 DIAGNOSIS — I959 Hypotension, unspecified: Secondary | ICD-10-CM | POA: Diagnosis present

## 2024-04-06 DIAGNOSIS — Z8249 Family history of ischemic heart disease and other diseases of the circulatory system: Secondary | ICD-10-CM

## 2024-04-06 DIAGNOSIS — I10 Essential (primary) hypertension: Secondary | ICD-10-CM | POA: Diagnosis not present

## 2024-04-06 DIAGNOSIS — Z87891 Personal history of nicotine dependence: Secondary | ICD-10-CM

## 2024-04-06 DIAGNOSIS — I4891 Unspecified atrial fibrillation: Secondary | ICD-10-CM | POA: Diagnosis present

## 2024-04-06 DIAGNOSIS — I2489 Other forms of acute ischemic heart disease: Secondary | ICD-10-CM | POA: Diagnosis present

## 2024-04-06 DIAGNOSIS — I4819 Other persistent atrial fibrillation: Secondary | ICD-10-CM | POA: Diagnosis present

## 2024-04-06 DIAGNOSIS — E785 Hyperlipidemia, unspecified: Secondary | ICD-10-CM | POA: Diagnosis present

## 2024-04-06 DIAGNOSIS — F419 Anxiety disorder, unspecified: Secondary | ICD-10-CM | POA: Diagnosis present

## 2024-04-06 DIAGNOSIS — Z955 Presence of coronary angioplasty implant and graft: Secondary | ICD-10-CM

## 2024-04-06 DIAGNOSIS — Z7901 Long term (current) use of anticoagulants: Secondary | ICD-10-CM

## 2024-04-06 DIAGNOSIS — R0602 Shortness of breath: Secondary | ICD-10-CM

## 2024-04-06 DIAGNOSIS — I2510 Atherosclerotic heart disease of native coronary artery without angina pectoris: Secondary | ICD-10-CM | POA: Diagnosis present

## 2024-04-06 DIAGNOSIS — R0902 Hypoxemia: Principal | ICD-10-CM

## 2024-04-06 DIAGNOSIS — Z6824 Body mass index (BMI) 24.0-24.9, adult: Secondary | ICD-10-CM

## 2024-04-06 DIAGNOSIS — I509 Heart failure, unspecified: Secondary | ICD-10-CM

## 2024-04-06 DIAGNOSIS — Z888 Allergy status to other drugs, medicaments and biological substances status: Secondary | ICD-10-CM

## 2024-04-06 DIAGNOSIS — E876 Hypokalemia: Secondary | ICD-10-CM | POA: Diagnosis present

## 2024-04-06 DIAGNOSIS — E43 Unspecified severe protein-calorie malnutrition: Secondary | ICD-10-CM | POA: Diagnosis present

## 2024-04-06 DIAGNOSIS — J188 Other pneumonia, unspecified organism: Secondary | ICD-10-CM | POA: Diagnosis present

## 2024-04-06 HISTORY — DX: Unstable angina: I20.0

## 2024-04-06 LAB — CBC WITH DIFFERENTIAL/PLATELET
Abs Immature Granulocytes: 0.14 10*3/uL — ABNORMAL HIGH (ref 0.00–0.07)
Basophils Absolute: 0 10*3/uL (ref 0.0–0.1)
Basophils Relative: 0 %
Eosinophils Absolute: 0 10*3/uL (ref 0.0–0.5)
Eosinophils Relative: 0 %
HCT: 36.4 % — ABNORMAL LOW (ref 39.0–52.0)
Hemoglobin: 12.2 g/dL — ABNORMAL LOW (ref 13.0–17.0)
Immature Granulocytes: 2 %
Lymphocytes Relative: 5 %
Lymphs Abs: 0.5 10*3/uL — ABNORMAL LOW (ref 0.7–4.0)
MCH: 31.4 pg (ref 26.0–34.0)
MCHC: 33.5 g/dL (ref 30.0–36.0)
MCV: 93.8 fL (ref 80.0–100.0)
Monocytes Absolute: 0.7 10*3/uL (ref 0.1–1.0)
Monocytes Relative: 7 %
Neutro Abs: 8.3 10*3/uL — ABNORMAL HIGH (ref 1.7–7.7)
Neutrophils Relative %: 86 %
Platelets: 203 10*3/uL (ref 150–400)
RBC: 3.88 MIL/uL — ABNORMAL LOW (ref 4.22–5.81)
RDW: 13.3 % (ref 11.5–15.5)
Smear Review: NORMAL
WBC: 9.6 10*3/uL (ref 4.0–10.5)
nRBC: 0 % (ref 0.0–0.2)

## 2024-04-06 LAB — CBC
HCT: 35.4 % — ABNORMAL LOW (ref 39.0–52.0)
Hemoglobin: 11.9 g/dL — ABNORMAL LOW (ref 13.0–17.0)
MCH: 31.5 pg (ref 26.0–34.0)
MCHC: 33.6 g/dL (ref 30.0–36.0)
MCV: 93.7 fL (ref 80.0–100.0)
Platelets: 207 10*3/uL (ref 150–400)
RBC: 3.78 MIL/uL — ABNORMAL LOW (ref 4.22–5.81)
RDW: 13.5 % (ref 11.5–15.5)
WBC: 9.4 10*3/uL (ref 4.0–10.5)
nRBC: 0 % (ref 0.0–0.2)

## 2024-04-06 LAB — CREATININE, SERUM
Creatinine, Ser: 1.09 mg/dL (ref 0.61–1.24)
GFR, Estimated: >60 mL/min

## 2024-04-06 LAB — URINALYSIS, W/ REFLEX TO CULTURE (INFECTION SUSPECTED)
Bacteria, UA: NONE SEEN
Bilirubin Urine: NEGATIVE
Glucose, UA: NEGATIVE mg/dL
Ketones, ur: NEGATIVE mg/dL
Leukocytes,Ua: NEGATIVE
Nitrite: NEGATIVE
Protein, ur: 100 mg/dL — AB
Specific Gravity, Urine: 1.013 (ref 1.005–1.030)
pH: 6 (ref 5.0–8.0)

## 2024-04-06 LAB — RESP PANEL BY RT-PCR (RSV, FLU A&B, COVID)  RVPGX2
Influenza A by PCR: NEGATIVE
Influenza B by PCR: NEGATIVE
Resp Syncytial Virus by PCR: NEGATIVE
SARS Coronavirus 2 by RT PCR: NEGATIVE

## 2024-04-06 LAB — COMPREHENSIVE METABOLIC PANEL WITH GFR
ALT: 14 U/L (ref 0–44)
AST: 20 U/L (ref 15–41)
Albumin: 3.1 g/dL — ABNORMAL LOW (ref 3.5–5.0)
Alkaline Phosphatase: 54 U/L (ref 38–126)
Anion gap: 11 (ref 5–15)
BUN: 17 mg/dL (ref 8–23)
CO2: 24 mmol/L (ref 22–32)
Calcium: 9 mg/dL (ref 8.9–10.3)
Chloride: 100 mmol/L (ref 98–111)
Creatinine, Ser: 1.14 mg/dL (ref 0.61–1.24)
GFR, Estimated: >60 mL/min
Glucose, Bld: 165 mg/dL — ABNORMAL HIGH (ref 70–99)
Potassium: 4 mmol/L (ref 3.5–5.1)
Sodium: 134 mmol/L — ABNORMAL LOW (ref 135–145)
Total Bilirubin: 0.9 mg/dL (ref 0.0–1.2)
Total Protein: 6.4 g/dL — ABNORMAL LOW (ref 6.5–8.1)

## 2024-04-06 LAB — PROTIME-INR
INR: 1.2 (ref 0.8–1.2)
Prothrombin Time: 16.1 s — ABNORMAL HIGH (ref 11.4–15.2)

## 2024-04-06 LAB — I-STAT CG4 LACTIC ACID, ED
Lactic Acid, Venous: 1.8 mmol/L (ref 0.5–1.9)
Lactic Acid, Venous: 1.6 mmol/L (ref 0.5–1.9)

## 2024-04-06 LAB — TROPONIN T, HIGH SENSITIVITY: Troponin T High Sensitivity: 53 ng/L — ABNORMAL HIGH (ref 0–19)

## 2024-04-06 LAB — MRSA NEXT GEN BY PCR, NASAL: MRSA by PCR Next Gen: NOT DETECTED

## 2024-04-06 LAB — PRO BRAIN NATRIURETIC PEPTIDE: Pro Brain Natriuretic Peptide: 4494 pg/mL — ABNORMAL HIGH

## 2024-04-06 MED ORDER — FUROSEMIDE 10 MG/ML IJ SOLN
40.0000 mg | Freq: Two times a day (BID) | INTRAMUSCULAR | Status: DC
  Administered 2024-04-06: 40 mg via INTRAVENOUS
  Filled 2024-04-06: qty 4

## 2024-04-06 MED ORDER — DILTIAZEM HCL-DEXTROSE 125-5 MG/125ML-% IV SOLN (PREMIX)
5.0000 mg/h | INTRAVENOUS | Status: DC
  Administered 2024-04-06: 5 mg/h via INTRAVENOUS
  Administered 2024-04-07: 10 mg/h via INTRAVENOUS
  Filled 2024-04-06 (×4): qty 125

## 2024-04-06 MED ORDER — ACETAMINOPHEN 325 MG PO TABS
650.0000 mg | ORAL_TABLET | Freq: Four times a day (QID) | ORAL | Status: DC | PRN
  Administered 2024-04-06 – 2024-04-08 (×3): 650 mg via ORAL
  Filled 2024-04-06 (×3): qty 2

## 2024-04-06 MED ORDER — DIGOXIN 0.25 MG/ML IJ SOLN
0.2500 mg | Freq: Three times a day (TID) | INTRAMUSCULAR | Status: AC
  Administered 2024-04-06 – 2024-04-07 (×3): 0.25 mg via INTRAVENOUS
  Filled 2024-04-06 (×4): qty 1

## 2024-04-06 MED ORDER — SODIUM CHLORIDE 0.9 % IV SOLN
100.0000 mg | Freq: Two times a day (BID) | INTRAVENOUS | Status: DC
  Filled 2024-04-06: qty 100

## 2024-04-06 MED ORDER — SODIUM CHLORIDE 0.9 % IV SOLN: INTRAVENOUS | Status: DC

## 2024-04-06 MED ORDER — SODIUM CHLORIDE 0.9 % IV SOLN
1.0000 g | Freq: Once | INTRAVENOUS | Status: AC
  Administered 2024-04-06: 1 g via INTRAVENOUS
  Filled 2024-04-06: qty 10

## 2024-04-06 MED ORDER — SODIUM CHLORIDE 0.9 % IV BOLUS
500.0000 mL | Freq: Once | INTRAVENOUS | Status: AC
  Administered 2024-04-06: 500 mL via INTRAVENOUS

## 2024-04-06 MED ORDER — APIXABAN 5 MG PO TABS
5.0000 mg | ORAL_TABLET | Freq: Two times a day (BID) | ORAL | Status: DC
  Administered 2024-04-06 – 2024-04-15 (×18): 5 mg via ORAL
  Filled 2024-04-06 (×18): qty 1

## 2024-04-06 MED ORDER — ALPRAZOLAM 0.25 MG PO TABS: 0.5000 mg | ORAL_TABLET | Freq: Three times a day (TID) | ORAL | Status: DC

## 2024-04-06 MED ORDER — SIMVASTATIN 20 MG PO TABS
20.0000 mg | ORAL_TABLET | Freq: Every day | ORAL | Status: DC
  Administered 2024-04-07 – 2024-04-15 (×9): 20 mg via ORAL
  Filled 2024-04-06 (×9): qty 1

## 2024-04-06 MED ORDER — DILTIAZEM HCL ER COATED BEADS 240 MG PO CP24: 240.0000 mg | ORAL_CAPSULE | Freq: Every day | ORAL | Status: DC

## 2024-04-06 MED ORDER — SODIUM CHLORIDE 0.9 % IV SOLN
1.0000 g | INTRAVENOUS | Status: DC
  Administered 2024-04-07 – 2024-04-10 (×4): 1 g via INTRAVENOUS
  Filled 2024-04-06 (×4): qty 10

## 2024-04-06 MED ORDER — PANTOPRAZOLE SODIUM 40 MG PO TBEC
40.0000 mg | DELAYED_RELEASE_TABLET | Freq: Every day | ORAL | Status: DC
  Administered 2024-04-07 – 2024-04-15 (×9): 40 mg via ORAL
  Filled 2024-04-06 (×9): qty 1

## 2024-04-06 MED ORDER — FUROSEMIDE 10 MG/ML IJ SOLN
20.0000 mg | Freq: Once | INTRAMUSCULAR | Status: AC
  Administered 2024-04-06: 20 mg via INTRAVENOUS
  Filled 2024-04-06: qty 2

## 2024-04-06 MED ORDER — AZITHROMYCIN 500 MG PO TABS
500.0000 mg | ORAL_TABLET | Freq: Every day | ORAL | Status: AC
  Administered 2024-04-06 – 2024-04-08 (×3): 500 mg via ORAL
  Filled 2024-04-06: qty 2
  Filled 2024-04-06 (×2): qty 1

## 2024-04-06 MED ORDER — ALPRAZOLAM 0.5 MG PO TABS
1.0000 mg | ORAL_TABLET | Freq: Every day | ORAL | Status: DC
  Administered 2024-04-06 – 2024-04-14 (×9): 1 mg via ORAL
  Filled 2024-04-06 (×9): qty 2

## 2024-04-06 MED ORDER — FLUTICASONE FUROATE-VILANTEROL 100-25 MCG/ACT IN AEPB
1.0000 | INHALATION_SPRAY | Freq: Every day | RESPIRATORY_TRACT | Status: DC
  Administered 2024-04-08 – 2024-04-09 (×2): 1 via RESPIRATORY_TRACT
  Filled 2024-04-06: qty 28

## 2024-04-06 MED ORDER — ENOXAPARIN SODIUM 40 MG/0.4ML IJ SOSY
40.0000 mg | PREFILLED_SYRINGE | INTRAMUSCULAR | Status: DC
  Administered 2024-04-06: 40 mg via SUBCUTANEOUS
  Filled 2024-04-06: qty 0.4
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
## 2024-04-06 NOTE — Progress Notes (Signed)
 [facility] night cross cover note:   Prn acetaminophen  added for patient's mildly elevated temperature of 99.2 in this patient who is admitted earlier today for multifocal pneumonia.  Per the patient, his baseline temperature typically runs on the lower side.     Eva Pore, DO Hospitalist

## 2024-04-06 NOTE — Consult Note (Signed)
 "  Cardiology Consultation   Patient ID: Troy Jackson MRN: [id number]; DOB: 10/23/1945  Admit date: 04/06/2024 Date of Consult: 04/06/2024  PCP:  Shayne Anes, MD   [facility] Providers Cardiologist:  Peter Jordan, MD        Patient Profile: Troy Jackson is a 79 y.o. male with a hx of CAD status post PCI, hyperlipidemia, tobacco abuse, COPD, GERD and prior paroxysmal atrial fibrillation chronically anticoagulated with Eliquis  who is being seen 04/06/2024 for the evaluation of A-fib RVR and the setting of multifocal pneumonia at the request of Dr. Georgina.  History of Present Illness: Troy Jackson notes that for approximately a week has been feeling unwell and feels he should have come into the hospital several days ago.  His symptoms began on Tuesday accompanied by chills, fever, loss of appetite.  He noticed that his oxygen  saturations were low and he checks his vital signs every day.  He had some wheezing and gasping for air.  On Wednesday of this past week he took an additional metoprolol  due to feeling of tachycardia and palpitations.  Due to refusal for ED presentation he stayed home trying to manage his symptoms.  On presentation he was tachypneic and hypoxic initially requiring BiPAP, transitioning to high flow nasal cannula and finally room air.  He was also tachycardic, with an elevated proBNP.  Chest x-ray showed focal consolidations left lower lobe and right middle lobe, EKG demonstrates A-fib with RVR.  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  No chest pain or shortness of breath at home prior to his illness.  He does not feel he has had any recent atrial fibrillation either.  He religiously takes his Eliquis  but on further questioning states he could have maybe missed 1 or 2 doses in the last 30 days.  We had originally thought amiodarone  would be an excellent choice  to treat his A-fib while unwell in the hospital, however now we may need to take extra caution and not provide rhythm control without preceding TEE.  Pertinent labs include sodium 134, creatinine 1.09.  Initial troponin 53, pro BNP 4494, normal lactate, CBC demonstrates hemoglobin 11.9, platelet count 207k, WBCs 9.4.   Past Medical History:  Diagnosis Date   Anxiety    COPD (chronic obstructive pulmonary disease) (HCC)    Coronary artery disease 2017   stent   Diverticulitis    Dyspnea    doe, with exertion   Dysrhythmia    atrial fibrillation   GERD (gastroesophageal reflux disease)    Hyperlipidemia    Hypertension    Pneumonia    2019   Tobacco abuse 2017    Past Surgical History:  Procedure Laterality Date   CARDIAC CATHETERIZATION N/A 02/03/2016   Procedure: Left Heart Cath and Coronary Angiography;  Surgeon: Peter M Jordan, MD;  Location: [facility] INVASIVE CV LAB;  Service: Cardiovascular;  Laterality: N/A;   CARDIAC CATHETERIZATION N/A 02/03/2016   Procedure: Coronary Stent Intervention;  Surgeon: Peter M Jordan, MD;  Location: [facility] INVASIVE CV LAB;  Service: Cardiovascular;  Laterality: N/A;   COLONOSCOPY     CORONARY ANGIOPLASTY WITH STENT PLACEMENT  02/03/2016   SHOULDER SURGERY Right    as young adult   SHOULDER SURGERY Left    as child   SPINAL CORD STIMULATOR INSERTION N/A 07/25/2018   Procedure: LUMBAR SPINAL CORD STIMULATOR INSERTION;  Surgeon: Burnetta Aures,  MD;  Location: [facility] OR;  Service: Orthopedics;  Laterality: N/A;  2 hrs     Home Medications:  Prior to Admission medications  Medication Sig Start Date End Date Taking? Authorizing Provider  albuterol  (PROVENTIL  HFA;VENTOLIN  HFA) 108 (90 Base) MCG/ACT inhaler Inhale 2 puffs into the lungs every 6 (six) hours as needed for wheezing or shortness of breath.    Yes [provider]  albuterol  (PROVENTIL ) (2.5 MG/3ML) 0.083% nebulizer solution Take 2.5 mg by nebulization every 4 (four) hours as needed for  shortness of breath or wheezing.   Yes [provider]  ALPRAZolam  (XANAX ) 0.5 MG tablet Take 0.5-1 mg by mouth See admin instructions. Take 0.5 mg every morning and take 1 mg every evening 01/13/11  Yes [provider]  apixaban  (ELIQUIS ) 5 MG TABS tablet Take 5 mg by mouth 2 (two) times daily.   Yes [provider]  budesonide -formoterol  (SYMBICORT) 80-4.5 MCG/ACT inhaler Inhale 2 puffs into the lungs 2 (two) times daily.    Yes [provider]  Cholecalciferol  (VITAMIN D3) 50 MCG (2000 UT) capsule Take 2,000 Units by mouth daily.   Yes [provider]  dicyclomine  (BENTYL ) 10 MG capsule Take 10 mg by mouth every 8 (eight) hours as needed for spasms.    Yes [provider]  diltiazem  (CARDIZEM  CD) 240 MG 24 hr capsule Take 1 capsule (240 mg total) by mouth daily. 08/03/17  Yes Akula, Vijaya, MD  ezetimibe  (ZETIA ) 10 MG tablet Take 10 mg by mouth every evening.   Yes [provider]  Fluticasone -Umeclidin-Vilant (TRELEGY ELLIPTA) 100-62.5-25 MCG/ACT AEPB Inhale 1 puff into the lungs in the morning and at bedtime.   Yes [provider]  ibuprofen (ADVIL) 200 MG tablet Take 400 mg by mouth every 8 (eight) hours as needed for mild pain (pain score 1-3) or moderate pain (pain score 4-6).   Yes [provider]  metoprolol  tartrate (LOPRESSOR ) 25 MG tablet Take 0.5 tablets (12.5 mg total) by mouth 2 (two) times daily as needed (as needed fOR HR> 120/min.). Patient taking differently: Take 25 mg by mouth 2 (two) times daily as needed (as needed fOR HR> 120/min.). 08/02/17 04/06/24 Yes Cherlyn Labella, MD  nitroGLYCERIN  (NITROSTAT ) 0.4 MG SL tablet Place 1 tablet (0.4 mg total) under the tongue every 5 (five) minutes as needed for chest pain. 08/02/17  Yes Akula, Vijaya, MD  simvastatin  (ZOCOR ) 20 MG tablet Take 20 mg by mouth every evening.   Yes [provider]    Scheduled Meds:  ALPRAZolam   1 mg Oral QHS   apixaban   5  mg Oral BID   azithromycin   500 mg Oral Daily   digoxin   0.25 mg Intravenous TID   fluticasone  furoate-vilanterol  1 puff Inhalation Daily   [START ON 04/07/2024] pantoprazole   40 mg Oral Daily   [START ON 04/07/2024] simvastatin   20 mg Oral Daily   Continuous Infusions:  [START ON 04/07/2024] cefTRIAXone  (ROCEPHIN )  IV     diltiazem  (CARDIZEM ) infusion 10 mg/hr (04/06/24 1511)   PRN Meds:   Allergies:   Allergies[1]  Social History:   Social History   Socioeconomic History   Marital status: Married    Spouse name: Not on file   Number of children: 2   Years of education: Not on file   Highest education level: Not on file  Occupational History   Occupation: retired sales promotion account executive; works as a it sales professional  Tobacco Use   Smoking status: Former    Current  packs/day: 0.00    Average packs/day: 1 pack/day for 60.0 years (60.0 ttl pk-yrs)    Types: Cigarettes    Start date: 52    Quit date: 2017    Years since quitting: 9.0   Smokeless tobacco: Never  Vaping Use   Vaping status: Never Used  Substance and Sexual Activity   Alcohol use: Not Currently    Alcohol/week: 20.0 standard drinks of alcohol    Types: 20 Cans of beer per week    Comment: cannot remember when his last day was without a drink   Drug use: No   Sexual activity: Not on file  Other Topics Concern   Not on file  Social History Narrative   Not on file   Social Drivers of Health   Tobacco Use: Low Risk (02/27/2024)   Received from [facility]   Patient History    Passive Exposure: Not on file    Smokeless Tobacco Use: Never    Smoking Tobacco Use: Never  Financial Resource Strain: Not on file  Food Insecurity: No Food Insecurity (04/06/2024)   Epic    Worried About Programme Researcher, Broadcasting/film/video in the Last Year: Never true    Ran Out of Food in the Last Year: Never true  Transportation Needs: No Transportation Needs (04/06/2024)   Epic    Lack of Transportation (Medical): No    Lack of Transportation  (Non-Medical): No  Physical Activity: Not on file  Stress: Not on file  Social Connections: Socially Integrated (04/06/2024)   Social Connection and Isolation Panel    Frequency of Communication with Friends and Family: More than three times a week    Frequency of Social Gatherings with Friends and Family: More than three times a week    Attends Religious Services: More than 4 times per year    Active Member of [employer] or Organizations: Yes    Attends Banker Meetings: More than 4 times per year    Marital Status: Married  Catering Manager Violence: Not At Risk (04/06/2024)   Epic    Fear of Current or Ex-Partner: No    Emotionally Abused: No    Physically Abused: No    Sexually Abused: No  Depression (PHQ2-9): Not on file  Alcohol Screen: Not on file  Housing: Low Risk (04/06/2024)   Epic    Unable to Pay for Housing in the Last Year: No    Number of Times Moved in the Last Year: 0    Homeless in the Last Year: No  Utilities: Not At Risk (04/06/2024)   Epic    Threatened with loss of utilities: No  Health Literacy: Not on file    Family History:    Family History  Problem Relation Age of Onset   Heart disease Father    Lung cancer Father    Heart attack Father    Alcohol abuse Father    Alzheimer's disease Mother    Heart attack Paternal Grandfather    Alcohol abuse Paternal Grandfather    Alcohol abuse Brother      ROS:  Please see the history of present illness.   All other ROS reviewed and negative.     Physical Exam/Data: Vitals:   04/06/24 1510 04/06/24 1530 04/06/24 1659 04/06/24 1702  BP: (!) 113/91 103/61 110/88 108/65  Pulse: (!) 132 (!) 131 (!) 143 (!) 132  Resp: (!) 41 (!) 44 (!) 44 (!) 22  Temp:   98.1 F (36.7 C) 98.1 F (  36.7 C)  TempSrc:   Oral Oral  SpO2: 94% 95% 90% 90%  Weight:      Height:        Intake/Output Summary (Last 24 hours) at 04/06/2024 1806 Last data filed at 04/06/2024 1504 Gross per 24 hour  Intake 543.3 ml   Output --  Net 543.3 ml      04/06/2024    8:50 AM 11/14/2019   10:48 AM 03/20/2019    8:12 AM  Last 3 Weights  Weight (lbs) 190 lb 214 lb 3.2 oz 210 lb  Weight (kg) 86.183 kg 97.16 kg 95.255 kg     Body mass index is 24.93 kg/m.  General: Mildly toxic appearing no distress HEENT: normal Neck: no JVD Cardiac: Irregular and tachycardic, no apparent murmurs Lungs: Shallow breaths, overall mid to lower lung fields are clear Abd: soft, nontender, no hepatomegaly  Ext: no edema, warm Musculoskeletal:  No deformities, BUE and BLE strength normal and equal Skin: warm and dry  Neuro:  CNs 2-12 intact, no focal abnormalities noted Psych:  Normal affect   EKG:  The EKG was personally reviewed and demonstrates: Atrial fibrillation rapid ventricular response Rate 140, abnormal R wave progression Telemetry:  Telemetry was personally reviewed and demonstrates: A-fib RVR rates 120s to 130s  Relevant CV Studies: None this admission  Laboratory Data: High Sensitivity Troponin:  No results for input(s): TROPONINIHS in the last 720 hours.  Recent Labs  Lab 04/06/24 1058  TRNPT 53*      Chemistry Recent Labs  Lab 04/06/24 0848 04/06/24 1058  NA 134*  --   K 4.0  --   CL 100  --   CO2 24  --   GLUCOSE 165*  --   BUN 17  --   CREATININE 1.14 1.09  CALCIUM 9.0  --   GFRNONAA >60 >60  ANIONGAP 11  --     Recent Labs  Lab 04/06/24 0848  PROT 6.4*  ALBUMIN 3.1*  AST 20  ALT 14  ALKPHOS 54  BILITOT 0.9   Lipids No results for input(s): CHOL, TRIG, HDL, LABVLDL, LDLCALC, CHOLHDL in the last 168 hours.  Hematology Recent Labs  Lab 04/06/24 0848 04/06/24 1058  WBC 9.6 9.4  RBC 3.88* 3.78*  HGB 12.2* 11.9*  HCT 36.4* 35.4*  MCV 93.8 93.7  MCH 31.4 31.5  MCHC 33.5 33.6  RDW 13.3 13.5  PLT 203 207   Thyroid  No results for input(s): TSH, FREET4 in the last 168 hours.  BNP Recent Labs  Lab 04/06/24 0848  PROBNP 4,494.0*    DDimer No results for  input(s): DDIMER in the last 168 hours.  Radiology/Studies:  DG Chest Port 1 View Result Date: 04/06/2024 EXAM: 1 VIEW(S) XRAY OF THE CHEST 04/06/2024 09:20:00 AM COMPARISON: 07/23/2018 CLINICAL HISTORY: Questionable sepsis. Evaluate for abnormality. FINDINGS: LINES, TUBES AND DEVICES: Spinal stimulator placement. LUNGS AND PLEURA: Bilateral chronic interstitial lung changes and emphysema. New patchy alveolar opacities in left lung base and right mid to lower lung zone. No pleural effusion. No pneumothorax. HEART AND MEDIASTINUM: No acute abnormality of the cardiac and mediastinal silhouettes. BONES AND SOFT TISSUES: No acute osseous abnormality. IMPRESSION: 1. New patchy alveolar opacities in the left lung base and right mid to lower lung zone, concerning for infection. Electronically signed by: Katheleen Faes MD 04/06/2024 09:47 AM EST RP Workstation: [id number]     Assessment and Plan:  Atrial fibrillation with rapid ventricular response, persistent Hypotension, iatrogenic -Patient has a history of A-fib  and is chronically anticoagulated with Eliquis  5 mg twice daily, continue without interruption.  He may have missed 1 or 2 doses in the last 30 days on thorough questioning.  This limits our options to rate control only prior to obtaining a TEE.  A-fib is now present in the setting of multifocal pneumonia, clearly precipitated by illness. -He is on diltiazem  infusion, however is mildly hypotensive.  I originally wanted to use amiodarone  but would avoid unless hemodynamically unstable given that he may have missed a dose or 2 of Eliquis  in the last 30 days.  Would want to not precipitate chemical cardioversion in that case. -Will use digoxin  0.25 mg x 3 doses for loading to help with rates and not impact blood pressure. -Patient has a PTA prescription for oral diltiazem , for now continue IV diltiazem  given the ability to titrate.  He also has as needed metoprolol  at home.  Right now we are slightly  limited by hypotension.  HFpEF -Patient received 2 doses of IV Lasix  and currently appears largely euvolemic.  Would observe at this point and use diuresis to improve respiratory status as needed.  Can redose Lasix  tomorrow as needed  CAD status post NSTEMI with PCI to the LCx in 2017 November -No current chest pain.  Minimal elevation of troponin likely demand ischemia -Currently no procedures planned, continue Eliquis  5 mg twice daily, continue simvastatin   Hyperlipidemia -Continue simvastatin   Hypertension -Currently hypotensive, hold home medications in favor of IV diltiazem  for rate control.  Risk Assessment/Risk Scores:       [place]  Heart Association (NYHA) Functional Class NYHA Class II  CHA2DS2-VASc Score = 4   This indicates a 4.8% annual risk of stroke. The patient's score is based upon: CHF History: 0 HTN History: 1 Diabetes History: 0 Stroke History: 0 Vascular Disease History: 1 Age Score: 2 Gender Score: 0        For questions or updates, please contact [facility] Please consult [email or web address] for contact info under      Signed, Troy DELENA Merck, MD  04/06/2024 6:06 PM     [1]  Allergies Allergen Reactions   Statins Other (See Comments)    Myalgia with some statins, on simvastatin  currently per spouse   "

## 2024-04-06 NOTE — ED Provider Notes (Signed)
 " [place] [facility]PARTMENT AT [facility] Provider Note   CSN: [id number] Arrival date & time: 04/06/24  9160     Patient presents with: Shortness of Breath   Troy Jackson is a 79 y.o. male with past medical history of hypertension, hyperlipidemia, coronary artery disease, GERD, COPD, atrial fibrillation on Eliquis  presents to emergency room with complaint of shortness of breath for the last few days.  Patient reports that he has had significant amount of productive cough, shortness of breath and intermittent fevers and chills at home.  He was reportedly found saturating at 80% on room air and improved to upper 80s on nonrebreather and was eventually put on CPAP by EMS.  He was given 1 breathing treatment prior to arrival.  He denies any swelling in feet and ankles.    Shortness of Breath      Prior to Admission medications  Medication Sig Start Date End Date Taking? Authorizing Provider  albuterol  (PROVENTIL  HFA;VENTOLIN  HFA) 108 (90 Base) MCG/ACT inhaler Inhale 2 puffs into the lungs every 6 (six) hours as needed for wheezing or shortness of breath.     [provider]  albuterol  (PROVENTIL ) (2.5 MG/3ML) 0.083% nebulizer solution Take 2.5 mg by nebulization 2 (two) times a day.    [provider]  ALPRAZolam  (XANAX ) 0.5 MG tablet Take 0.5 mg by mouth 3 (three) times daily.  01/13/11   [provider]  apixaban  (ELIQUIS ) 2.5 MG TABS tablet Take 2.5 mg by mouth 2 (two) times daily.    [provider]  budesonide -formoterol  (SYMBICORT) 80-4.5 MCG/ACT inhaler Inhale 2 puffs into the lungs 2 (two) times daily.     [provider]  Cholecalciferol  (VITAMIN D3) 50 MCG (2000 UT) capsule Take 2,000 Units by mouth daily.    [provider]  dicyclomine  (BENTYL ) 10 MG capsule Take 10 mg by mouth every 8 (eight) hours as needed for spasms.     [provider]  diltiazem  (CARDIZEM  CD) 240 MG 24 hr capsule Take 1 capsule  (240 mg total) by mouth daily. 08/03/17   Akula, Vijaya, MD  DULoxetine  (CYMBALTA ) 60 MG capsule Take 60 mg by mouth daily.     [provider]  ezetimibe  (ZETIA ) 10 MG tablet Take 10 mg by mouth daily.    [provider]  Menthol , Topical Analgesic, (ZIMS MAX-FREEZE EX) Apply 1 application topically daily as needed (muscle pain).    [provider]  metoprolol  tartrate (LOPRESSOR ) 25 MG tablet Take 0.5 tablets (12.5 mg total) by mouth 2 (two) times daily as needed (as needed fOR HR> 120/min.). Patient taking differently: Take 25 mg by mouth 2 (two) times daily as needed (as needed fOR HR> 120/min.).  08/02/17 03/20/19  Akula, Vijaya, MD  nitroGLYCERIN  (NITROSTAT ) 0.4 MG SL tablet Place 1 tablet (0.4 mg total) under the tongue every 5 (five) minutes as needed for chest pain. 08/02/17   Cherlyn Labella, MD  ondansetron  (ZOFRAN ) 4 MG tablet Take 1 tablet (4 mg total) by mouth every 8 (eight) hours as needed for nausea or vomiting. 07/25/18   Burnetta Aures, MD  pantoprazole  (PROTONIX ) 40 MG tablet Take 1 tablet (40 mg total) by mouth daily. Patient taking differently: Take 40 mg by mouth at bedtime.  02/05/16   Regalado, Belkys A, MD  simvastatin  (ZOCOR ) 20 MG tablet Take 20 mg by mouth daily.    [provider]  valsartan (DIOVAN) 80 MG tablet Take 80 mg by mouth at bedtime.  [provider]    Allergies: Statins    Review of Systems  Respiratory:  Positive for shortness of breath.     Updated Vital Signs BP (!) 116/105   Pulse (!) 118   Temp 98.5 F (36.9 C) (Axillary)   Resp (!) 22   Ht 6' 1.2 (1.859 m)   Wt 86.2 kg   SpO2 100%   BMI 24.93 kg/m   Physical Exam Vitals and nursing note reviewed.  Constitutional:      General: He is not in acute distress.    Appearance: He is not toxic-appearing.  HENT:     Head: Normocephalic and atraumatic.  Eyes:     General: No scleral icterus.    Conjunctiva/sclera: Conjunctivae normal.   Cardiovascular:     Rate and Rhythm: Tachycardia present. Rhythm irregular.     Pulses: Normal pulses.     Heart sounds: Normal heart sounds.  Pulmonary:     Effort: Pulmonary effort is normal. No respiratory distress.     Breath sounds: Normal breath sounds.  Abdominal:     General: Abdomen is flat. Bowel sounds are normal.     Palpations: Abdomen is soft.     Tenderness: There is no abdominal tenderness.  Musculoskeletal:     Right lower leg: No edema.     Left lower leg: No edema.  Skin:    General: Skin is warm and dry.     Findings: No lesion.  Neurological:     General: No focal deficit present.     Mental Status: He is alert and oriented to person, place, and time. Mental status is at baseline.     (all labs ordered are listed, but only abnormal results are displayed) Labs Reviewed  COMPREHENSIVE METABOLIC PANEL WITH GFR - Abnormal; Notable for the following components:      Result Value   Sodium 134 (*)    Glucose, Bld 165 (*)    Total Protein 6.4 (*)    Albumin 3.1 (*)    All other components within normal limits  CBC WITH DIFFERENTIAL/PLATELET - Abnormal; Notable for the following components:   RBC 3.88 (*)    Hemoglobin 12.2 (*)    HCT 36.4 (*)    All other components within normal limits  PROTIME-INR - Abnormal; Notable for the following components:   Prothrombin Time 16.1 (*)    All other components within normal limits  PRO BRAIN NATRIURETIC PEPTIDE - Abnormal; Notable for the following components:   Pro Brain Natriuretic Peptide 4,494.0 (*)    All other components within normal limits  CULTURE, BLOOD (ROUTINE X 2)  CULTURE, BLOOD (ROUTINE X 2)  RESP PANEL BY RT-PCR (RSV, FLU A&B, COVID)  RVPGX2  URINALYSIS, W/ REFLEX TO CULTURE (INFECTION SUSPECTED)  CBC  CREATININE, SERUM  I-STAT CG4 LACTIC ACID, ED  I-STAT CG4 LACTIC ACID, ED  TROPONIN T, HIGH SENSITIVITY    EKG: EKG Interpretation Date/Time:  Saturday April 06 2024 08:55:06  EST Ventricular Rate:  143 PR Interval:    QRS Duration:  85 QT Interval:  297 QTC Calculation: 459 R Axis:   48  Text Interpretation: Atrial fibrillation with rapid V-rate Abnormal R-wave progression, early transition when compared to prior, now afib with RVR no STEMI Confirmed by Ginger Barefoot ([id number]) on 04/06/2024 9:11:05 AM  Radiology: [facility] Chest Port 1 View Result Date: 04/06/2024 EXAM: 1 VIEW(S) XRAY OF THE CHEST 04/06/2024 09:20:00 AM COMPARISON: 07/23/2018 CLINICAL HISTORY: Questionable sepsis. Evaluate for abnormality. FINDINGS: LINES,  TUBES AND DEVICES: Spinal stimulator placement. LUNGS AND PLEURA: Bilateral chronic interstitial lung changes and emphysema. New patchy alveolar opacities in left lung base and right mid to lower lung zone. No pleural effusion. No pneumothorax. HEART AND MEDIASTINUM: No acute abnormality of the cardiac and mediastinal silhouettes. BONES AND SOFT TISSUES: No acute osseous abnormality. IMPRESSION: 1. New patchy alveolar opacities in the left lung base and right mid to lower lung zone, concerning for infection. Electronically signed by: Katheleen Faes MD 04/06/2024 09:47 AM EST RP Workstation: [id number]     .Critical Care  Performed by: Shermon Warren SAILOR, PA-C Authorized by: Shermon Warren SAILOR, PA-C   Critical care provider statement:    Critical care time (minutes):  30   Critical care was necessary to treat or prevent imminent or life-threatening deterioration of the following conditions:  Sepsis and respiratory failure   Critical care was time spent personally by me on the following activities:  Development of treatment plan with patient or surrogate, discussions with consultants, evaluation of patient's response to treatment, examination of patient, ordering and review of laboratory studies, ordering and review of radiographic studies, ordering and performing treatments and interventions, pulse oximetry, re-evaluation of patient's condition and review of  old charts   Care discussed with: admitting provider      Medications Ordered in the ED  enoxaparin  (LOVENOX ) injection 40 mg (has no administration in time range)  cefTRIAXone  (ROCEPHIN ) 1 g in sodium chloride  0.9 % 100 mL IVPB (has no administration in time range)  azithromycin  (ZITHROMAX ) tablet 500 mg (has no administration in time range)  furosemide  (LASIX ) injection 40 mg (has no administration in time range)  sodium chloride  0.9 % bolus 500 mL (0 mLs Intravenous Stopped 04/06/24 1008)  cefTRIAXone  (ROCEPHIN ) 1 g in sodium chloride  0.9 % 100 mL IVPB (0 g Intravenous Stopped 04/06/24 1038)    Clinical Course as of 04/06/24 1051  Sat Apr 06, 2024  1015 Patient was reassessed and able to transition off of BiPAP by respiratory therapy.  Currently on high flow nasal cannula 6 L.  Continues to be mildly tachycardic and EKG is showing A-fib with rapid ventricular response likely secondary to infection.  Mildly tachypneic.  Saturating 98%. [JB]    Clinical Course User Index [JB] Ravan Schlemmer, Warren SAILOR, PA-C                                 Medical Decision Making Amount and/or Complexity of Data Reviewed Labs: ordered. Radiology: ordered.  Risk Decision regarding hospitalization.   This patient presents to the ED for concern of SOB, this involves an extensive number of treatment options, and is a complaint that carries with it a high risk of complications and morbidity.  The differential diagnosis includes right embolism, CHF, pneumonia, COPD, pneumonia, pneumothorax COPD   Co morbidities that complicate the patient evaluation  COPD   Additional history obtained:  Additional history obtained from patient's last echo was 07/27/2017   Lab Tests:  I personally interpreted labs.  The pertinent results include:   CBC is remarkable for a white blood cell count of 9.6 proBNP is elevated at 4000 Lactic is 1.8 Blood cultures are pending   Imaging Studies ordered:  I ordered imaging  studies including chest x-ray I independently visualized and interpreted imaging which showed bilateral lower lung opacities concerning for infection I agree with the radiologist interpretation   Cardiac Monitoring: / EKG:  The patient  was maintained on a cardiac monitor.  I personally viewed and interpreted the cardiac monitored which showed an underlying rhythm of: Atrial fibrillation with rapid ventricular response   Consultations Obtained:  I requested consultation with the hospital team for admission,  and discussed lab and imaging findings as well as pertinent plan.   Problem List / ED Course / Critical interventions / Medication management  Patient presents emergency room with complaint of shortness of breath.  Patient was initially presenting on BiPAP.  He is tachycardic and tachypneic.  EKG showing atrial fibrillation with rapid ventricular response.  He has reported fever of 104 at home.  Code sepsis called.  Lungs without any obvious Rales nor wheezing.  His lab work shows elevated BNP but no obvious fluid overload seen on exam.  Chest x-ray is showing a pneumonia which does seem consistent with patient's shortness of breath, productive cough and fevers at home.  He was started on IV antibiotics.  Given elevated BNP will give small fluid bolus also has normal lactic.  Patient's rate did improve after receiving fluids here. Reassessment patient was able to tolerate being transitioned off of BiPAP and is currently on 6 L high flow nasal cannula. I ordered medication including NS, Abx Reevaluation of the patient after these medicines showed that the patient improved I have reviewed the patients home medicines and have made adjustments as needed. Will require admission due to acute hypoxic respiratory failure concerning for pneumonia with elevated BNP.        Final diagnoses:  Hypoxia  Shortness of breath  Community acquired pneumonia, unspecified laterality  Acute congestive  heart failure, unspecified heart failure type [facility])    ED Discharge Orders     None          Shermon Warren SAILOR, PA-C 04/06/24 1051    Tegeler, Lonni PARAS, MD 04/06/24 1542  "

## 2024-04-06 NOTE — Sepsis Progress Note (Signed)
 Sepsis protocol is being followed by [facility].

## 2024-04-06 NOTE — ED Triage Notes (Signed)
 BIB EMS from home r/t Worsening SOB that started yesterday. EMS found patient oxygen  80% on RA. Baseline no oxygen  need. Hx COPD. EMS administered duoneb and CPAP with improved work of breathing and oxygen  up to 98%. Afib 180s for EMS. A&Ox4. Reports flu like symptoms x1 week.

## 2024-04-07 ENCOUNTER — Other Ambulatory Visit: Payer: Self-pay

## 2024-04-07 ENCOUNTER — Encounter (HOSPITAL_COMMUNITY): Payer: Self-pay | Admitting: Hospitalist

## 2024-04-07 DIAGNOSIS — J188 Other pneumonia, unspecified organism: Secondary | ICD-10-CM

## 2024-04-07 DIAGNOSIS — I4891 Unspecified atrial fibrillation: Secondary | ICD-10-CM | POA: Diagnosis not present

## 2024-04-07 DIAGNOSIS — I10 Essential (primary) hypertension: Secondary | ICD-10-CM | POA: Diagnosis not present

## 2024-04-07 LAB — BASIC METABOLIC PANEL WITH GFR
Anion gap: 10 (ref 5–15)
BUN: 22 mg/dL (ref 8–23)
CO2: 27 mmol/L (ref 22–32)
Calcium: 8.3 mg/dL — ABNORMAL LOW (ref 8.9–10.3)
Chloride: 100 mmol/L (ref 98–111)
Creatinine, Ser: 1.16 mg/dL (ref 0.61–1.24)
GFR, Estimated: >60 mL/min
Glucose, Bld: 116 mg/dL — ABNORMAL HIGH (ref 70–99)
Potassium: 3.8 mmol/L (ref 3.5–5.1)
Sodium: 136 mmol/L (ref 135–145)

## 2024-04-07 LAB — CBC
HCT: 34.6 % — ABNORMAL LOW (ref 39.0–52.0)
Hemoglobin: 11.7 g/dL — ABNORMAL LOW (ref 13.0–17.0)
MCH: 31.3 pg (ref 26.0–34.0)
MCHC: 33.8 g/dL (ref 30.0–36.0)
MCV: 92.5 fL (ref 80.0–100.0)
Platelets: 214 10*3/uL (ref 150–400)
RBC: 3.74 MIL/uL — ABNORMAL LOW (ref 4.22–5.81)
RDW: 13.5 % (ref 11.5–15.5)
WBC: 8.7 10*3/uL (ref 4.0–10.5)
nRBC: 0 % (ref 0.0–0.2)

## 2024-04-07 MED ORDER — DIGOXIN 0.25 MG/ML IJ SOLN: 0.2500 mg | Freq: Every day | INTRAMUSCULAR | Status: DC

## 2024-04-07 MED ORDER — ALBUTEROL SULFATE (2.5 MG/3ML) 0.083% IN NEBU
2.5000 mg | INHALATION_SOLUTION | RESPIRATORY_TRACT | Status: DC | PRN
  Administered 2024-04-09: 2.5 mg via RESPIRATORY_TRACT
  Filled 2024-04-07 (×3): qty 3

## 2024-04-07 MED ORDER — DILTIAZEM HCL 60 MG PO TABS
60.0000 mg | ORAL_TABLET | Freq: Four times a day (QID) | ORAL | Status: DC
  Administered 2024-04-07 – 2024-04-08 (×3): 60 mg via ORAL
  Filled 2024-04-07 (×3): qty 1

## 2024-04-07 MED ORDER — DIGOXIN 125 MCG PO TABS
0.2500 mg | ORAL_TABLET | Freq: Every day | ORAL | Status: DC
  Administered 2024-04-08: 0.25 mg via ORAL
  Filled 2024-04-07: qty 2

## 2024-04-07 NOTE — Hospital Course (Addendum)
 Troy Jackson is a 79 y.o. male with PMH of CAD s/p PCI, Afib, HTN, HLD, and COPD presented to the ED with shortness of breath and dyspnea on exertion since last Tuesday along with chills fever loss of appetite, hypoxic and wheezing and gasping for air  In the ED patient tachycardic tachypneic,w/ hypoxia (required BiPAP initially and transitioned to HFNC 6L/min; on RA PTA). Labs notable for pro-BNP 4494. CXR showed focal consolidations in LLL and RML. EKG showed AFRVR. Patient was admitted for multifocal pneumonia along with A-fib with RVR and HFpEF exacerbation. Patient needed BiPAP initially in the ED and was admitted for further management Patient continued on IV antibiotics, seen by cardiology for A-fib with RVR 1/28:  needing nonrebreather and HHFNC seen by pulmonary as well as cardiology  Subjective: Seen and examined He states he feels better this am, he is coughing up stuffs No chest pain , appears some short of breath Overnight afebrile BP stable patient is down to HFNC at 10 L no longer on HHFNC AT 40L. BMP with stable electrolytes.  Assessment and plan:  Multifocal pneumonia Acute hypoxic respiratory failure w/ respiratory distress: Presenting with shortness of breath hypoxia, CXR-multifocal consolidation  Initially needed BiPAP, LASIX  X 2  then switched to HFNC since, antibiotics continued to cover CAP. Blood culture NGTD,MRSA PCR negative, no sputum to culture, Legionella urien ag neg. Patient having worsening hypoxia on 1/28 am w/ needing NRB  CXR-w/ worsened b/l and airspace opacities-concerning for worsening CHF, or worsening pneumonia- but no fever. ABG-no CO2 retention pO2 53 pH 7.4> changed to HHFNC Seen by PCCM placed on aggressive IV Lasix , IV steroid continued changed to Zosyn , vancomycin  1/28 HHFNC 40 l/m  transitiond to HFNC 1/30 am, overall clinically improving. Cont current plan Continue pulmonary toileting IS, FV, prn nebs, Brovana  Pulmicort  Yupleri,  diuretics No suspicion of PE, his echo was unchanged with preserved EF-and on Eliquis  already Check cxr In am  COPD Emphysemia: On trelegy at home here on nebs Brovana , yupleri and Pulmicort   and IV steroid in the setting of pneumonia and worsening hypoxia   Acute on chronic HFpEF: S/p 2 doses of IV Lasix  In ED. chest x-ray with interstitial opacities worsening suspect CHF exacerbation, also component of pneumonia Started back on IV Lasix  twice daily continue same with monitoring of renal function.  Appreciate cardiology input Weight on admission 190lb> now at 184.8lb Net IO Since Admission: -3,210.21 mL [04/12/24 1030]   Persistent A-fib with RVR Hypotension, iatrogenic: History of A-fib PTA on Eliquis , Cardizem , PRN metoprolol . On admission RVR likely due to underlying infection  Patient converted to NSR- now on cardizem , Eliquis  and Multaq .Echo 1/26 shows EF 55-60%, G2 DD.  Cardiology following.  CAD with history of NSTEMI s/p PCI to LCx in 01/2016 Hyperlipidemia Hypertension: BP controlled on Cardizem .Troponin minimally elevated from demand ischemia in the setting of hypoxia. Continue Eliquis , statin. hold rest of BP meds.   Hypokalemia: Resolved  Chronic anxiety on Xanax : He takes Xanax  bid, cont same  Mild anemia: Hemoglobin stable.  Monitor  Deconditioning/debility Failure to thrive Mobility: Continue PT OT, RD eval augment diet. PT Orders: Active PT Follow up Rec: Home Health Pt1/28/2026 1200    DVT prophylaxis: Eliquis  Code Status:   Code Status: Full Code Family Communication: plan of care discussed with patient at bedside.  Multiple times updated patient's wife and daughter-On daily basis. Patient reports no need to call wife today  Wife is a retired RT. All questions were answered  Patient  status is: Remains hospitalized because of severity of illness Level of care: Progressive   Dispo: The patient is from: home            Anticipated disposition:  TBD Objective: Vitals last 24 hrs: Vitals:   04/12/24 0305 04/12/24 0500 04/12/24 0752 04/12/24 0800  BP: 110/61     Pulse: 62 62 76 71  Resp: 20 (!) 21 20 20   Temp: 97.7 F (36.5 C)   97.7 F (36.5 C)  TempSrc: Oral     SpO2: 94% 95%  90%  Weight:  83.8 kg    Height:       Physical Examination: General exam: AAOX3, pleasant, not in distress, on HFNC HEENT:Oral mucosa moist, Ear/Nose WNL grossly Respiratory system: Bilaterally breath sounds diminished and no use of accessory muscles  Cardiovascular system: S1 & S2 +, No JVD. Gastrointestinal system: Abdomen soft,NT,ND, BS+ Nervous System: Alert, awake, Non focal Extremities: extremities warm, leg edema neg Skin: Warm, no rashes MSK: Normal muscle bulk,tone, power   Medications reviewed:  Scheduled Meds:  ALPRAZolam   0.5 mg Oral Q0600   ALPRAZolam   1 mg Oral QHS   apixaban   5 mg Oral BID   arformoterol   15 mcg Nebulization BID   budesonide  (PULMICORT ) nebulizer solution  0.25 mg Nebulization BID   cholecalciferol   2,000 Units Oral Daily   diltiazem   240 mg Oral Daily   dronedarone   400 mg Oral BID WC   ezetimibe   10 mg Oral QPM   furosemide   40 mg Intravenous Q12H   lactose free nutrition  237 mL Oral BID BM   methylPREDNISolone  (SOLU-MEDROL ) injection  40 mg Intravenous Daily   nystatin   5 mL Oral QID   pantoprazole   40 mg Oral Daily   revefenacin   175 mcg Nebulization Daily   simvastatin   20 mg Oral Daily  Continuous Infusions:  piperacillin -tazobactam (ZOSYN )  IV 12.5 mL/hr at 04/12/24 0900   vancomycin  1,000 mg (04/12/24 1019)   Diet Order             Diet renal/carb modified with fluid restriction Diet-HS Snack? Nothing; Fluid restriction: 1500 mL Fluid; Room service appropriate? Yes; Fluid consistency: Thin  Diet effective now

## 2024-04-07 NOTE — Plan of Care (Signed)
  Problem: Education: Goal: Knowledge of General Education information will improve Description: Including pain rating scale, medication(s)/side effects and non-pharmacologic comfort measures Outcome: Progressing   Problem: Clinical Measurements: Goal: Ability to maintain clinical measurements within normal limits will improve Outcome: Progressing Goal: Diagnostic test results will improve Outcome: Progressing Goal: Respiratory complications will improve Outcome: Progressing Goal: Cardiovascular complication will be avoided Outcome: Progressing   Problem: Activity: Goal: Risk for activity intolerance will decrease Outcome: Progressing   Problem: Coping: Goal: Level of anxiety will decrease Outcome: Progressing

## 2024-04-07 NOTE — Progress Notes (Signed)
 " PROGRESS NOTE HIROKI WINT  FMW:[id number] DOB: Jun 28, 1945 DOA: 04/06/2024 PCP: Shayne Anes, MD  Brief Narrative/Hospital Course: DEUNTE BLEDSOE is a 79 y.o. male with PMH of CAD s/p PCI, Afib, HTN, HLD, and COPD presented to the ED with shortness of breath and dyspnea on exertion since last Tuesday along with chills fever loss of appetite, hypoxic and wheezing and gasping for air  In the ED patient tachycardic tachypneic,w/ hypoxia (required BiPAP initially and transitioned to HFNC 6L/min; on RA PTA). Labs notable for pro-BNP 4494. CXR showed focal consolidations in LLL and RML. EKG showed AFRVR.  Patient was admitted for multifocal pneumonia along with A-fib with RVR and HFpEF exacerbation  Subjective: Seen and examined today Patient complains of ongoing cough with productive sputum, and feels poorly-better overall better compared to presentation Overnight afebrile, BP 90s-105, heart rate improved on admission in 140s, Labs-stable electrolytes CBC.  On admission BiPAP weaned down to HFNC 7 l, able to speak in full sentences without any distress  Assessment and plan:  Multifocal pneumonia Acute hypoxic respiratory failure: Presenting with shortness of breath hypoxia chest x-ray with multifocal consolidation although no fever and no WBC count. Continue on ceftriaxone  azithromycin .  Blood culture pending.  Continue IS, FV, supplemental  to keep spo2>92%  Persistent A-fib with RVR Hypotension, iatrogenic: History of A-fib on Eliquis , Cardizem , PRN metoprolol  PTA.RVR on admission secondary to pneumonia hypoxia. Continue Cardizem  drip and transition to p.o. as able.Cardiology following-use digoxin  for rate control. Continue to monitor on telemetry.  HFpEF: Received 2 doses of IV Lasix  currently euvolemic.  Monitor volume status. Redose prn   CAD with history of NSTEMI s/p PCI to LCx in 01/2016 Hyperlipidemia Hypertension: Currently no chest pain, troponin minimally elevated from  demand ischemia in the setting of hypoxia. Continue home Eliquis  and statin.  Continue to hold home antihypertensive as patient on Cardizem   Mild anemia: Hemoglobin stable.  Monitor  DVT prophylaxis: Eliquis  Code Status:   Code Status: Full Code Family Communication: plan of care discussed with patient/ at bedside. Patient status is: Remains hospitalized because of severity of illness Level of care: Progressive   Dispo: The patient is from: home            Anticipated disposition: TBD Objective: Vitals last 24 hrs: Vitals:   04/06/24 2300 04/07/24 0100 04/07/24 0500 04/07/24 0722  BP: 99/63 90/64 93/62  (!) 105/50  Pulse: 94 89  86  Resp: (!) 31 (!) 21  20  Temp: 98.1 F (36.7 C)  98.1 F (36.7 C) 98 F (36.7 C)  TempSrc: Axillary  Oral Oral  SpO2: 96% 95%  90%  Weight:      Height:       Physical Examination: General exam: alert awake, oriented, older than his stated age HEENT:Oral mucosa moist, Ear/Nose WNL grossly Respiratory system: Bilaterally BS distant,no use of accessory muscle Cardiovascular system: S1 & S2 +, No JVD. Gastrointestinal system: Abdomen soft,NT,ND, BS+ Nervous System: Alert, awake, moving all extremities,and following commands. Extremities: extremities warm, leg edema neg Skin: Warm, no rashes MSK: Normal muscle bulk,tone, power   Medications reviewed:  Scheduled Meds:  ALPRAZolam   1 mg Oral QHS   apixaban   5 mg Oral BID   azithromycin   500 mg Oral Daily   [START ON 04/08/2024] digoxin   0.25 mg Oral Daily   fluticasone  furoate-vilanterol  1 puff Inhalation Daily   pantoprazole   40 mg Oral Daily   simvastatin   20 mg Oral Daily   Continuous Infusions:  cefTRIAXone  (ROCEPHIN )  IV 1 g (04/07/24 0817)   diltiazem  (CARDIZEM ) infusion 10 mg/hr (04/07/24 0300)   Diet: Diet Order             Diet 2 gram sodium Room service appropriate? Yes; Fluid consistency: Thin  Diet effective now                    Unresulted Labs (From admission,  onward)     Start     Ordered   04/08/24 0500  Basic metabolic panel with GFR  Tomorrow morning,   R       Question:  Specimen collection method  Answer:  Lab=Lab collect   04/07/24 0756           Data Reviewed: I have personally reviewed following labs and imaging studies ( see epic result tab) CBC: Recent Labs  Lab 04/06/24 0848 04/06/24 1058 04/07/24 0205  WBC 9.6 9.4 8.7  NEUTROABS 8.3*  --   --   HGB 12.2* 11.9* 11.7*  HCT 36.4* 35.4* 34.6*  MCV 93.8 93.7 92.5  PLT 203 207 214   CMP: Recent Labs  Lab 04/06/24 0848 04/06/24 1058 04/07/24 0205  NA 134*  --  136  K 4.0  --  3.8  CL 100  --  100  CO2 24  --  27  GLUCOSE 165*  --  116*  BUN 17  --  22  CREATININE 1.14 1.09 1.16  CALCIUM 9.0  --  8.3*   GFR: Estimated Creatinine Clearance: 59.7 mL/min (by C-G formula based on SCr of 1.16 mg/dL). Recent Labs  Lab 04/06/24 0848  AST 20  ALT 14  ALKPHOS 54  BILITOT 0.9  PROT 6.4*  ALBUMIN 3.1*   No results for input(s): LIPASE, AMYLASE in the last 168 hours. No results for input(s): AMMONIA in the last 168 hours. Coagulation Profile:  Recent Labs  Lab 04/06/24 0848  INR 1.2   Antimicrobials/Microbiology: Anti-infectives (From admission, onward)    Start     Dose/Rate Route Frequency Ordered Stop   04/07/24 1000  cefTRIAXone  (ROCEPHIN ) 1 g in sodium chloride  0.9 % 100 mL IVPB        1 g 200 mL/hr over 30 Minutes Intravenous Every 24 hours 04/06/24 1046 04/11/24 0959   04/06/24 1100  azithromycin  (ZITHROMAX ) tablet 500 mg        500 mg Oral Daily 04/06/24 1046 04/09/24 0959   04/06/24 1015  cefTRIAXone  (ROCEPHIN ) 1 g in sodium chloride  0.9 % 100 mL IVPB        1 g 200 mL/hr over 30 Minutes Intravenous  Once 04/06/24 1001 04/06/24 1038   04/06/24 1015  doxycycline  (VIBRAMYCIN ) 100 mg in sodium chloride  0.9 % 250 mL IVPB  Status:  Discontinued        100 mg 125 mL/hr over 120 Minutes Intravenous Every 12 hours 04/06/24 1001 04/06/24 1046          Component Value Date/Time   SDES BLOOD SITE NOT SPECIFIED 04/06/2024 0853   SPECREQUEST  04/06/2024 0853    BOTTLES DRAWN AEROBIC AND ANAEROBIC Blood Culture results may not be optimal due to an inadequate volume of blood received in culture bottles   CULT  04/06/2024 0853    NO GROWTH 1 DAY Performed at [facility], [address], [place], [place] [address]    REPTSTATUS PENDING 04/06/2024 9146    Procedures:  Mennie LAMY, MD [facility] 04/07/2024, 10:59 AM   "

## 2024-04-07 NOTE — Progress Notes (Signed)
 "  Progress Note  Patient Name: FIORE DETJEN Date of Encounter: 04/07/2024 [facility] Cardiologist: Peter Jordan, MD   Interval Summary   Rates have improved with digoxin . Patient continues to feel poorly.  Vital Signs Vitals:   04/06/24 2208 04/06/24 2300 04/07/24 0100 04/07/24 0500  BP:  99/63 90/64 93/62   Pulse: (!) 110 94 89   Resp: (!) 31 (!) 31 (!) 21   Temp:  98.1 F (36.7 C)  98.1 F (36.7 C)  TempSrc:  Axillary  Oral  SpO2: 96% 96% 95%   Weight:      Height:        Intake/Output Summary (Last 24 hours) at 04/07/2024 0719 Last data filed at 04/06/2024 1504 Gross per 24 hour  Intake 543.3 ml  Output --  Net 543.3 ml      04/06/2024    8:50 AM 11/14/2019   10:48 AM 03/20/2019    8:12 AM  Last 3 Weights  Weight (lbs) 190 lb 214 lb 3.2 oz 210 lb  Weight (kg) 86.183 kg 97.16 kg 95.255 kg      Telemetry/ECG  Afib rates 95-105 - Personally Reviewed  Physical Exam  GEN: No acute distress.   Neck: No JVD Cardiac: iRRR, no murmurs, rubs, or gallops.  Respiratory: Clear to auscultation bilaterally. GI: Soft, nontender, non-distended  MS: No edema, feel warm.   Assessment & Plan   JAHMERE BRAMEL is a 79 y.o. male with a hx of CAD status post PCI, hyperlipidemia, tobacco abuse, COPD, GERD and prior paroxysmal atrial fibrillation chronically anticoagulated with Eliquis  who is being seen 04/06/2024 for the evaluation of A-fib RVR and the setting of multifocal pneumonia at the request of Dr. Georgina.   Atrial fibrillation with rapid ventricular response, persistent Hypotension, iatrogenic -Patient has a history of A-fib and is chronically anticoagulated with Eliquis  5 mg twice daily, continue without interruption.  He may have missed 1 or 2 doses in the last 30 days on thorough questioning.  This limits our options to rate control only prior to obtaining a TEE.  A-fib is now present in the setting of multifocal pneumonia, clearly precipitated by illness. -He  is on diltiazem  infusion, however is mildly hypotensive.  I originally wanted to use amiodarone  but would avoid unless hemodynamically unstable given that he may have missed a dose or 2 of Eliquis  in the last 30 days.  Would want to not precipitate chemical cardioversion in that case. -Will use digoxin  0.25 mg x 3 doses then daily for loading to help with rates and not impact blood pressure. This has helped. -Patient has a PTA prescription for oral diltiazem , for now continue IV diltiazem  given the ability to titrate.  He also has as needed metoprolol  at home.  Right now we are slightly limited by hypotension.   HFpEF -Patient received 2 doses of IV Lasix  on admission and currently appears largely euvolemic.  Would observe at this point and use diuresis to improve respiratory status as needed.  Can redose Lasix  as needed - need strict I/o. No I/o charted.    CAD status post NSTEMI with PCI to the LCx in 2017 November -No current chest pain.  Minimal elevation of troponin likely demand ischemia -Currently no procedures planned, continue Eliquis  5 mg twice daily, continue simvastatin    Hyperlipidemia -Continue simvastatin  20 mg daily.   Hypertension -Currently hypotensive, hold home medications in favor of IV diltiazem  for rate control.    For questions or updates, please contact  [facility] Please consult [email or web address] for contact info under         Signed, Soyla DELENA Merck, MD   "

## 2024-04-08 ENCOUNTER — Inpatient Hospital Stay (HOSPITAL_COMMUNITY)

## 2024-04-08 ENCOUNTER — Other Ambulatory Visit (HOSPITAL_COMMUNITY): Payer: Self-pay

## 2024-04-08 ENCOUNTER — Telehealth (HOSPITAL_COMMUNITY): Payer: Self-pay

## 2024-04-08 DIAGNOSIS — I4891 Unspecified atrial fibrillation: Secondary | ICD-10-CM | POA: Diagnosis not present

## 2024-04-08 DIAGNOSIS — J188 Other pneumonia, unspecified organism: Secondary | ICD-10-CM | POA: Diagnosis not present

## 2024-04-08 DIAGNOSIS — I480 Paroxysmal atrial fibrillation: Secondary | ICD-10-CM | POA: Diagnosis not present

## 2024-04-08 LAB — ECHOCARDIOGRAM COMPLETE
AR max vel: 2.28 cm2
AV Area VTI: 2.26 cm2
AV Area mean vel: 2.27 cm2
AV Mean grad: 3.5 mmHg
AV Peak grad: 6.4 mmHg
Ao pk vel: 1.26 m/s
Area-P 1/2: 2.8 cm2
Height: 73.2 in
MV M vel: 1.36 m/s
MV Peak grad: 7.4 mmHg
MV VTI: 1.7 cm2
S' Lateral: 3.9 cm
Weight: 3040 [oz_av]

## 2024-04-08 LAB — EXPECTORATED SPUTUM ASSESSMENT W GRAM STAIN, RFLX TO RESP C

## 2024-04-08 LAB — BASIC METABOLIC PANEL WITH GFR
Anion gap: 10 (ref 5–15)
BUN: 28 mg/dL — ABNORMAL HIGH (ref 8–23)
CO2: 28 mmol/L (ref 22–32)
Calcium: 8.3 mg/dL — ABNORMAL LOW (ref 8.9–10.3)
Chloride: 97 mmol/L — ABNORMAL LOW (ref 98–111)
Creatinine, Ser: 0.96 mg/dL (ref 0.61–1.24)
GFR, Estimated: >60 mL/min
Glucose, Bld: 105 mg/dL — ABNORMAL HIGH (ref 70–99)
Potassium: 3.3 mmol/L — ABNORMAL LOW (ref 3.5–5.1)
Sodium: 135 mmol/L (ref 135–145)

## 2024-04-08 LAB — MAGNESIUM: Magnesium: 2 mg/dL (ref 1.7–2.4)

## 2024-04-08 MED ORDER — DILTIAZEM HCL ER COATED BEADS 120 MG PO CP24
240.0000 mg | ORAL_CAPSULE | Freq: Every day | ORAL | Status: DC
  Administered 2024-04-09 – 2024-04-15 (×7): 240 mg via ORAL
  Filled 2024-04-08 (×7): qty 2

## 2024-04-08 MED ORDER — POTASSIUM CHLORIDE CRYS ER 20 MEQ PO TBCR
40.0000 meq | EXTENDED_RELEASE_TABLET | Freq: Once | ORAL | Status: AC
  Administered 2024-04-08: 40 meq via ORAL
  Filled 2024-04-08: qty 2

## 2024-04-08 MED ORDER — PERFLUTREN LIPID MICROSPHERE
1.0000 mL | INTRAVENOUS | Status: AC | PRN
  Administered 2024-04-08: 6 mL via INTRAVENOUS

## 2024-04-08 MED ORDER — DILTIAZEM HCL ER COATED BEADS 180 MG PO CP24
180.0000 mg | ORAL_CAPSULE | Freq: Once | ORAL | Status: AC
  Administered 2024-04-08: 180 mg via ORAL
  Filled 2024-04-08: qty 1

## 2024-04-08 NOTE — Progress Notes (Signed)
 "  Rounding Note   Patient Name: Troy Jackson Date of Encounter: 04/08/2024  [facility] Cardiologist: Jolana Runkles, MD   Subjective Feels better. Still feels rough with mild SOB and continued cough. No chest pain.  Scheduled Meds:  ALPRAZolam   1 mg Oral QHS   apixaban   5 mg Oral BID   diltiazem   240 mg Oral Daily   fluticasone  furoate-vilanterol  1 puff Inhalation Daily   pantoprazole   40 mg Oral Daily   simvastatin   20 mg Oral Daily   Continuous Infusions:  cefTRIAXone  (ROCEPHIN )  IV 1 g (04/08/24 0839)   PRN Meds: acetaminophen , albuterol    Vital Signs  Vitals:   04/08/24 0617 04/08/24 0700 04/08/24 0735 04/08/24 0840  BP: (!) 118/54 (!) 118/54    Pulse:  71 74 75  Resp:  (!) 25 19   Temp:  (!) 97.3 F (36.3 C)    TempSrc:  Oral    SpO2:  93% (!) 88%   Weight:      Height:        Intake/Output Summary (Last 24 hours) at 04/08/2024 0849 Last data filed at 04/08/2024 0622 Gross per 24 hour  Intake 337.74 ml  Output 650 ml  Net -312.26 ml      04/06/2024    8:50 AM 11/14/2019   10:48 AM 03/20/2019    8:12 AM  Last 3 Weights  Weight (lbs) 190 lb 214 lb 3.2 oz 210 lb  Weight (kg) 86.183 kg 97.16 kg 95.255 kg      Telemetry NSR. Converted from Afib yesterday am 10:40 - Personally Reviewed  ECG  none - Personally Reviewed  Physical Exam  GEN: No acute distress.   Neck: No JVD Cardiac: RRR, no murmurs, rubs, or gallops.  Respiratory: scattered rhonchi GI: Soft, nontender, non-distended  MS: No edema; No deformity. Neuro:  Nonfocal  Psych: Normal affect   Labs High Sensitivity Troponin:  No results for input(s): TROPONINIHS in the last 720 hours.  Recent Labs  Lab 04/06/24 1058  TRNPT 53*       Chemistry Recent Labs  Lab 04/06/24 0848 04/06/24 1058 04/07/24 0205 04/08/24 0018  NA 134*  --  136 135  K 4.0  --  3.8 3.3*  CL 100  --  100 97*  CO2 24  --  27 28  GLUCOSE 165*  --  116* 105*  BUN 17  --  22 28*  CREATININE  1.14 1.09 1.16 0.96  CALCIUM 9.0  --  8.3* 8.3*  PROT 6.4*  --   --   --   ALBUMIN 3.1*  --   --   --   AST 20  --   --   --   ALT 14  --   --   --   ALKPHOS 54  --   --   --   BILITOT 0.9  --   --   --   GFRNONAA >60 >60 >60 >60  ANIONGAP 11  --  10 10    Lipids No results for input(s): CHOL, TRIG, HDL, LABVLDL, LDLCALC, CHOLHDL in the last 168 hours.  Hematology Recent Labs  Lab 04/06/24 0848 04/06/24 1058 04/07/24 0205  WBC 9.6 9.4 8.7  RBC 3.88* 3.78* 3.74*  HGB 12.2* 11.9* 11.7*  HCT 36.4* 35.4* 34.6*  MCV 93.8 93.7 92.5  MCH 31.4 31.5 31.3  MCHC 33.5 33.6 33.8  RDW 13.3 13.5 13.5  PLT 203 207 214   Thyroid  No  results for input(s): TSH, FREET4 in the last 168 hours.  BNP Recent Labs  Lab 04/06/24 0848  PROBNP 4,494.0*    DDimer No results for input(s): DDIMER in the last 168 hours.   Radiology  DG Chest Port 1 View Result Date: 04/06/2024 EXAM: 1 VIEW(S) XRAY OF THE CHEST 04/06/2024 09:20:00 AM COMPARISON: 07/23/2018 CLINICAL HISTORY: Questionable sepsis. Evaluate for abnormality. FINDINGS: LINES, TUBES AND DEVICES: Spinal stimulator placement. LUNGS AND PLEURA: Bilateral chronic interstitial lung changes and emphysema. New patchy alveolar opacities in left lung base and right mid to lower lung zone. No pleural effusion. No pneumothorax. HEART AND MEDIASTINUM: No acute abnormality of the cardiac and mediastinal silhouettes. BONES AND SOFT TISSUES: No acute osseous abnormality. IMPRESSION: 1. New patchy alveolar opacities in the left lung base and right mid to lower lung zone, concerning for infection. Electronically signed by: Katheleen Faes MD 04/06/2024 09:47 AM EST RP Workstation: [id number]    Cardiac Studies none  Patient Profile   79 y.o. male with a hx of CAD status post PCI, hyperlipidemia, tobacco abuse, COPD, GERD and prior paroxysmal atrial fibrillation chronically anticoagulated with Eliquis  who is being seen 04/06/2024 for the evaluation  of A-fib RVR and the setting of multifocal pneumonia at the request of Dr. Georgina.   Assessment & Plan  Atrial fibrillation with RVR in setting of CAP. Now converted back to NSR. Will consolidate diltiazem  to CD 240 mg daily which is what he was on at home. Will stop digoxin . Continue Eliquis . Will update Echo. CAD s/p remote PCI to LCx in 2017. No active angina.  HLD on simvastatin  20 mg  HTN  well controlled.  5.   CAP with acute hypoxic respiratory failure. Per hospitalist team    For questions or updates, please contact [facility] Please consult [email or web address] for contact info under       Signed, Haruko Mersch, MD  04/08/2024, 8:49 AM    "

## 2024-04-08 NOTE — Evaluation (Signed)
 Physical Therapy Evaluation Patient Details Name: Troy Jackson MRN: [id number] DOB: [birth year] Today's Date: 04/08/2024  History of Present Illness  79 y.o. male who presented to ED 04/06/24 with  SOB/DOE and found to have multifocal CAP c/b AFRVR and HFpEF exacerbation. PMH:CAD s/p PCI, Afib, HTN, HLD, and COPD  Clinical Impression  PTA pt living with wife in single story home with 6 steps to enter. 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 Pt is historically limited in mobility by back pain and has nerve stimulator that needs to be replaced. Pt is currently limited in safe mobility by increased SoB/DoE and O2 demand. Pt requiring CGA for transfers and ambulation with RW. Pt requiring 10L O2 via [place] to maintain SpO2 86%O2 with ambulation. Pt audibly breathing through mouth, PT stopped pt ambulation and had him breath through his nose. Pt with complaints of discomfort but able to rebound O2 to >90%O2. Encouraged pt to try to breath through his nose to utilize supplemental O2 appropriately. Also had pt practice purse lip exhale and educated pt on COPD disease process. Pt would benefit from increased energy conservation education and possibly HHPT at discharge but will likely refuse. PT will continue to follow acutely and refer to Mobility Specialist.       If plan is discharge home, recommend the following: A little help with walking and/or transfers;A little help with bathing/dressing/bathroom;Assistance with cooking/housework;Help with stairs or ramp for entrance   Can travel by private vehicle    Yes    Equipment Recommendations Rolling walker (2 wheels)     Functional Status Assessment Patient has had a recent decline in their functional status and demonstrates the ability to make significant improvements in function in a reasonable and predictable amount of time.     Precautions / Restrictions Precautions Precautions: Fall;Other (comment) Recall of  Precautions/Restrictions: Intact Precaution/Restrictions Comments: watch O2 (does not wear at baseline) Restrictions Weight Bearing Restrictions Per Provider Order: No      Mobility  Bed Mobility               General bed mobility comments: sitting up in recliner on entry    Transfers Overall transfer level: Needs assistance Equipment used: Rolling walker (2 wheels) Transfers: Sit to/from Stand Sit to Stand: Contact guard assist           General transfer comment: contact guard for power up and steadying at RW    Ambulation/Gait Ambulation/Gait assistance: Contact guard assist Gait Distance (Feet): 150 Feet Assistive device: Rolling walker (2 wheels) Gait Pattern/deviations: Step-through pattern, Trunk flexed, WFL(Within Functional Limits) Gait velocity: slowed Gait velocity interpretation: <1.8 ft/sec, indicate of risk for recurrent falls   General Gait Details: Contact guard for safety and management of IV pole, pt requires cuing for safety and proximity to RW         Balance Overall balance assessment: Needs assistance Sitting-balance support: No upper extremity supported, Feet supported Sitting balance-Leahy Scale: Good     Standing balance support: No upper extremity supported, Bilateral upper extremity supported, During functional activity Standing balance-Leahy Scale: Fair                               Pertinent Vitals/Pain Pain Assessment Pain Assessment: Faces Faces Pain Scale: Hurts little more Pain Location: back Pain Descriptors / Indicators: Grimacing, Guarding Pain Intervention(s): Monitored during session, Limited activity within patient's tolerance, Repositioned    Home Living  Family/patient expects to be discharged to:: Private residence Living Arrangements: Spouse/significant other Available Help at Discharge: Family;Available 24 hours/day Type of Home: House Home Access: Stairs to enter Entrance Stairs-Rails:  Right Entrance Stairs-Number of Steps: 5-6   Home Layout: One level Home Equipment: Shower seat - built in      Prior Function Prior Level of Function : Independent/Modified Independent;Driving             Mobility Comments: no AD ADLs Comments: Plays golf, works in field seismologist for medical illustrator     Extremity/Trunk Assessment   Upper Extremity Assessment Upper Extremity Assessment: Defer to OT evaluation    Lower Extremity Assessment Lower Extremity Assessment: Overall WFL for tasks assessed    Cervical / Trunk Assessment Cervical / Trunk Assessment: Normal  Communication   Communication Communication: No apparent difficulties    Cognition Arousal: Alert Behavior During Therapy: WFL for tasks assessed/performed                             Following commands: Intact       Cueing Cueing Techniques: Verbal cues     General Comments General comments (skin integrity, edema, etc.): SpO2 86% on 10 L O2 with activity at lowest. highest 93%. pt reports O2 runs low 90s at baseline, pt likely could use less O2 however, has difficulty sequencing purse lip breathing and inhalation through nose. provided education on weaning supplemental O2 and COPD disease mechanism    Exercises     Assessment/Plan    PT Assessment Patient needs continued PT services  PT Problem List Decreased strength;Decreased activity tolerance;Decreased balance;Cardiopulmonary status limiting activity;Pain       PT Treatment Interventions DME instruction;Gait training;Stair training;Functional mobility training;Therapeutic activities;Therapeutic exercise;Balance training;Cognitive remediation;Patient/family education    PT Goals (Current goals can be found in the Care Plan section)  Acute Rehab PT Goals PT Goal Formulation: With patient Time For Goal Achievement: 04/22/24 Potential to Achieve Goals: Fair    Frequency Min 2X/week        AM-PAC PT 6 Clicks Mobility  Outcome Measure  Help needed turning from your back to your side while in a flat bed without using bedrails?: None Help needed moving from lying on your back to sitting on the side of a flat bed without using bedrails?: A Little Help needed moving to and from a bed to a chair (including a wheelchair)?: A Little Help needed standing up from a chair using your arms (e.g., wheelchair or bedside chair)?: A Little Help needed to walk in hospital room?: A Little Help needed climbing 3-5 steps with a railing? : A Little 6 Click Score: 19    End of Session Equipment Utilized During Treatment: Gait belt;Oxygen  Activity Tolerance: Patient tolerated treatment well;Patient limited by pain (back pain) Patient left: in bed;with call bell/phone within reach;with bed alarm set Nurse Communication: Mobility status;Other (comment) (O2 demand) PT Visit Diagnosis: Muscle weakness (generalized) (M62.81);Difficulty in walking, not elsewhere classified (R26.2);Pain Pain - part of body:  (back)    Time: 9061-9044 PT Time Calculation (min) (ACUTE ONLY): 17 min   Charges:   PT Evaluation $PT Eval Low Complexity: 1 Low   PT General Charges $$ ACUTE PT VISIT: 1 Visit         Evelean Bigler B. Fleeta Lapidus PT, DPT Acute Rehabilitation Services Please use secure chat or  Call Office [phone]   Almarie KATHEE Fleeta [facility] 04/08/2024, 12:10 PM

## 2024-04-08 NOTE — Evaluation (Signed)
 Occupational Therapy Evaluation Patient Details Name: Troy Jackson MRN: [id number] DOB: 07/21/1945 Today's Date: 04/08/2024   History of Present Illness   79 y.o. male who presented to ED 04/06/24 with  SOB/DOE and found to have multifocal CAP c/b AFRVR and HFpEF exacerbation. PMH:CAD s/p PCI, Afib, HTN, HLD, and COPD     Clinical Impressions PTA, pt lives with spouse, typically completely independent and active at baseline. Pt presents now with deficits in cardiopulmonary endurance with new supplemental O2 needs. Pt requiring CGA for mobility using RW in hallway (chair follow provided but was not needed). Pt requires no more than Setup-Supervision for ADLs. Anticipate no OT needs upon DC but will continue to follow acutely to maximize independence and educate in energy conservation as needed.   SpO2 down to 86% with activity on 10 L O2. With cues for pursed lip breathing, able to maintain 91-93% on 10 L O2     If plan is discharge home, recommend the following:   Other (comment) (PRN)     Functional Status Assessment   Patient has had a recent decline in their functional status and demonstrates the ability to make significant improvements in function in a reasonable and predictable amount of time.     Equipment Recommendations   None recommended by OT     Recommendations for Other Services         Precautions/Restrictions   Precautions Precautions: Fall;Other (comment) Recall of Precautions/Restrictions: Intact Precaution/Restrictions Comments: watch O2 (does not wear at baseline) Restrictions Weight Bearing Restrictions Per Provider Order: No     Mobility Bed Mobility Overal bed mobility: Needs Assistance Bed Mobility: Sit to Supine       Sit to supine: Min assist   General bed mobility comments: Min A for BLE back to bed    Transfers Overall transfer level: Needs assistance Equipment used: Rolling walker (2 wheels) Transfers: Sit to/from  Stand Sit to Stand: Contact guard assist                  Balance Overall balance assessment: Needs assistance Sitting-balance support: No upper extremity supported, Feet supported Sitting balance-Leahy Scale: Good     Standing balance support: No upper extremity supported, Bilateral upper extremity supported, During functional activity Standing balance-Leahy Scale: Fair                             ADL either performed or assessed with clinical judgement   ADL Overall ADL's : Needs assistance/impaired Eating/Feeding: Independent   Grooming: Supervision/safety;Set up;Standing   Upper Body Bathing: Set up   Lower Body Bathing: Supervison/ safety;Set up;Sit to/from stand;Sitting/lateral leans   Upper Body Dressing : Set up   Lower Body Dressing: Supervision/safety;Set up;Sitting/lateral leans;Sit to/from stand   Toilet Transfer: Contact guard assist;Ambulation;Rolling walker (2 wheels)   Toileting- Clothing Manipulation and Hygiene: Supervision/safety;Sit to/from stand;Sitting/lateral lean       Functional mobility during ADLs: Contact guard assist;Rolling walker (2 wheels) General ADL Comments: able to mobilize in hallway, chair follow provided but not needed. Cues for breathing techniques throughout     Vision Baseline Vision/History: 1 Wears glasses Ability to See in Adequate Light: 0 Adequate Patient Visual Report: No change from baseline Vision Assessment?: No apparent visual deficits     Perception         Praxis         Pertinent Vitals/Pain Pain Assessment Pain Assessment: Faces Faces Pain Scale: Hurts little more  Pain Location: back Pain Descriptors / Indicators: Grimacing, Guarding Pain Intervention(s): Monitored during session, Limited activity within patient's tolerance, Repositioned     Extremity/Trunk Assessment Upper Extremity Assessment Upper Extremity Assessment: Defer to OT evaluation   Lower Extremity Assessment Lower  Extremity Assessment: Overall WFL for tasks assessed   Cervical / Trunk Assessment Cervical / Trunk Assessment: Normal   Communication Communication Communication: No apparent difficulties   Cognition Arousal: Alert Behavior During Therapy: WFL for tasks assessed/performed Cognition: No apparent impairments                               Following commands: Intact       Cueing  General Comments   Cueing Techniques: Verbal cues  SpO2 86% on 10 L O2 with activity at lowest. highest 93%. pt reports O2 runs low 90s at baseline   Exercises     Shoulder Instructions      Home Living Family/patient expects to be discharged to:: Private residence Living Arrangements: Spouse/significant other Available Help at Discharge: Family;Available 24 hours/day Type of Home: House Home Access: Stairs to enter [employer] of Steps: 5-6 Entrance Stairs-Rails: Right Home Layout: One level     Bathroom Shower/Tub: Producer, Television/film/video: Handicapped height     Home Equipment: Shower seat - built in          Prior Functioning/Environment Prior Level of Function : Independent/Modified Independent;Driving             Mobility Comments: no AD ADLs Comments: Emergency planning/management officer, works in field seismologist for [employer]    OT Problem List: Decreased activity tolerance;Cardiopulmonary status limiting activity;Impaired balance (sitting and/or standing)   OT Treatment/Interventions: Self-care/ADL training;Therapeutic exercise;Energy conservation;DME and/or AE instruction;Therapeutic activities;Patient/family education;Balance training      OT Goals(Current goals can be found in the care plan section)   Acute Rehab OT Goals Patient Stated Goal: wean off of O2 OT Goal Formulation: With patient Time For Goal Achievement: 04/22/24 Potential to Achieve Goals: Good   OT Frequency:  Min 2X/week    Co-evaluation              AM-PAC OT 6 Clicks Daily  Activity     Outcome Measure Help from another person eating meals?: None Help from another person taking care of personal grooming?: A Little Help from another person toileting, which includes using toliet, bedpan, or urinal?: A Little Help from another person bathing (including washing, rinsing, drying)?: A Little Help from another person to put on and taking off regular upper body clothing?: A Little Help from another person to put on and taking off regular lower body clothing?: A Little 6 Click Score: 19   End of Session Equipment Utilized During Treatment: Gait belt;Rolling walker (2 wheels);Oxygen  Nurse Communication: Mobility status;Other (comment) (O2)  Activity Tolerance: Patient tolerated treatment well Patient left: in bed;with bed alarm set;with call bell/phone within reach  OT Visit Diagnosis: Muscle weakness (generalized) (M62.81)                Time: 9075-9041 OT Time Calculation (min): 34 min Charges:  OT General Charges $OT Visit: 1 Visit OT Evaluation $OT Eval Moderate Complexity: 1 Mod  Mliss NOVAK, OTR/L Acute Rehab Services Office: [phone]   Mliss Fish 04/08/2024, 11:26 AM

## 2024-04-08 NOTE — Progress Notes (Signed)
 " PROGRESS NOTE Troy Jackson  FMW:[id number] DOB: 02-03-1946 DOA: 04/06/2024 PCP: Shayne Anes, MD  Brief Narrative/Hospital Course: Troy Jackson is a 79 y.o. male with PMH of CAD s/p PCI, Afib, HTN, HLD, and COPD presented to the ED with shortness of breath and dyspnea on exertion since last Tuesday along with chills fever loss of appetite, hypoxic and wheezing and gasping for air  In the ED patient tachycardic tachypneic,w/ hypoxia (required BiPAP initially and transitioned to HFNC 6L/min; on RA PTA). Labs notable for pro-BNP 4494. CXR showed focal consolidations in LLL and RML. EKG showed AFRVR.  Patient was admitted for multifocal pneumonia along with A-fib with RVR and HFpEF exacerbation  Subjective: Seen and examined  Resting comfortably on the bedside chair Nursing reports needing higher oxygen  up to 10-11 L on mobilizing otherwise at rest 5 to 6 L as he gets dyspneic with activity Overnight afebrile vitals stable, labs with mild hypokalemia.  Patient remains on 5 L of [place], PTA on room air  Assessment and plan:  Multifocal pneumonia Acute hypoxic respiratory failure: Presenting with shortness of breath hypoxia chest x-ray with multifocal consolidation although no fever and no WBC count. Blood culture NGTD, MRSA PCR negative, follow-up based on culture, urine antigens. Continue with ceftriaxone  azithromycin  IS, FV,Breo, prn nebs,supplemental [place] to keep spo2>92%. OOB PTOT.  Persistent A-fib with RVR Hypotension, iatrogenic: History of A-fib on Eliquis , Cardizem , PRN metoprolol  PTA. RVR likely due to underlying infection.  Patient converted to NSR and switch to p.o. Cardizem  1/25 evening> consolidating to to 40 mg. Cardiology following, continue Eliquis .stopping digoxin  0.25 daily  HFpEF: S/p 2 doses of IV Lasix , volume status stable. Cont lasix  prn   CAD with history of NSTEMI s/p PCI to LCx in 01/2016 Hyperlipidemia Hypertension: No chest pain.  BP fairly stable on  Cardizem .Troponin minimally elevated from demand ischemia in the setting of hypoxia. Continue home Eliquis , statin. Continue to hold home antihypertensive except Cardizem    Hypokalemia: Replace  Mild anemia: Hemoglobin stable.  Monitor  Deconditioning Mobility: PT Orders: Active PT Follow up Rec:     DVT prophylaxis: Eliquis  Code Status:   Code Status: Full Code Family Communication: plan of care discussed with patient/ at bedside. Patient status is: Remains hospitalized because of severity of illness Level of care: Progressive   Dispo: The patient is from: home            Anticipated disposition: TBD Objective: Vitals last 24 hrs: Vitals:   04/08/24 0617 04/08/24 0700 04/08/24 0735 04/08/24 0840  BP: (!) 118/54 (!) 118/54    Pulse:  71 74 75  Resp:  (!) 25 19   Temp:  (!) 97.3 F (36.3 C)    TempSrc:  Oral    SpO2:  93% (!) 88%   Weight:      Height:       Physical Examination: General exam: AAOX3 HEENT:Oral mucosa moist, Ear/Nose WNL grossly Respiratory system: Bilaterally distant breath sounds Cardiovascular system: S1 & S2 +, No JVD. Gastrointestinal system: Abdomen soft,NT,ND, BS+ Nervous System: Alert, awake, Non focal Extremities: extremities warm, leg edema neg Skin: Warm, no rashes MSK: Normal muscle bulk,tone, power   Medications reviewed:  Scheduled Meds:  ALPRAZolam   1 mg Oral QHS   apixaban   5 mg Oral BID   diltiazem   240 mg Oral Daily   fluticasone  furoate-vilanterol  1 puff Inhalation Daily   pantoprazole   40 mg Oral Daily   simvastatin   20 mg Oral Daily  Continuous Infusions:  cefTRIAXone  (ROCEPHIN )  IV 1 g (04/08/24 0839)   Diet Order             Diet 2 gram sodium Room service appropriate? Yes; Fluid consistency: Thin  Diet effective now                    Unresulted Labs (From admission, onward)     Start     Ordered   04/09/24 0500  Basic metabolic panel with GFR  Tomorrow morning,   R       Question:  Specimen collection  method  Answer:  Lab=Lab collect   04/08/24 0753   04/08/24 0748  Expectorated Sputum Assessment w Gram Stain, Rflx to Resp Cult  Once,   R        04/08/24 0749   04/08/24 0748  Strep pneumoniae urinary antigen  Once,   R        04/08/24 0749   04/08/24 0748  Legionella Pneumophila Serogp 1 Ur Ag  Once,   R        04/08/24 0749           Data Reviewed: I have personally reviewed following labs and imaging studies ( see epic result tab) CBC: Recent Labs  Lab 04/06/24 0848 04/06/24 1058 04/07/24 0205  WBC 9.6 9.4 8.7  NEUTROABS 8.3*  --   --   HGB 12.2* 11.9* 11.7*  HCT 36.4* 35.4* 34.6*  MCV 93.8 93.7 92.5  PLT 203 207 214   CMP: Recent Labs  Lab 04/06/24 0848 04/06/24 1058 04/07/24 0205 04/08/24 0018  NA 134*  --  136 135  K 4.0  --  3.8 3.3*  CL 100  --  100 97*  CO2 24  --  27 28  GLUCOSE 165*  --  116* 105*  BUN 17  --  22 28*  CREATININE 1.14 1.09 1.16 0.96  CALCIUM 9.0  --  8.3* 8.3*  MG  --   --   --  2.0   GFR: Estimated Creatinine Clearance: 72.1 mL/min (by C-G formula based on SCr of 0.96 mg/dL). Recent Labs  Lab 04/06/24 0848  AST 20  ALT 14  ALKPHOS 54  BILITOT 0.9  PROT 6.4*  ALBUMIN 3.1*   No results for input(s): LIPASE, AMYLASE in the last 168 hours. No results for input(s): AMMONIA in the last 168 hours. Coagulation Profile:  Recent Labs  Lab 04/06/24 0848  INR 1.2   Antimicrobials/Microbiology: Anti-infectives (From admission, onward)    Start     Dose/Rate Route Frequency Ordered Stop   04/07/24 1000  cefTRIAXone  (ROCEPHIN ) 1 g in sodium chloride  0.9 % 100 mL IVPB        1 g 200 mL/hr over 30 Minutes Intravenous Every 24 hours 04/06/24 1046 04/11/24 0959   04/06/24 1100  azithromycin  (ZITHROMAX ) tablet 500 mg        500 mg Oral Daily 04/06/24 1046 04/08/24 0840   04/06/24 1015  cefTRIAXone  (ROCEPHIN ) 1 g in sodium chloride  0.9 % 100 mL IVPB        1 g 200 mL/hr over 30 Minutes Intravenous  Once 04/06/24 1001 04/06/24 1038    04/06/24 1015  doxycycline  (VIBRAMYCIN ) 100 mg in sodium chloride  0.9 % 250 mL IVPB  Status:  Discontinued        100 mg 125 mL/hr over 120 Minutes Intravenous Every 12 hours 04/06/24 1001 04/06/24 1046         Component Value Date/Time  SDES BLOOD SITE NOT SPECIFIED 04/06/2024 0853   SPECREQUEST  04/06/2024 0853    BOTTLES DRAWN AEROBIC AND ANAEROBIC Blood Culture results may not be optimal due to an inadequate volume of blood received in culture bottles   CULT  04/06/2024 0853    NO GROWTH 1 DAY Performed at [facility], [address] [place], [place] [address]    REPTSTATUS PENDING 04/06/2024 9146    Procedures:  Mennie LAMY, MD [facility] 04/08/2024, 10:09 AM   "

## 2024-04-08 NOTE — Telephone Encounter (Signed)
 Pharmacy Patient Advocate Encounter  Insurance verification completed.    The patient is insured through [facility]. Patient has [employer], may use a copay card, and/or apply for patient assistance if available.    Ran test claim for Eliquis  5mg  tablet and the current 30 day co-pay is $0.   This test claim was processed through [employer]- copay amounts may vary at other pharmacies due to [employer], or as the patient moves through the different stages of their insurance plan.

## 2024-04-08 NOTE — Plan of Care (Signed)
°  Problem: Education: °Goal: Knowledge of General Education information will improve °Description: Including pain rating scale, medication(s)/side effects and non-pharmacologic comfort measures °Outcome: Progressing °  °Problem: Clinical Measurements: °Goal: Ability to maintain clinical measurements within normal limits will improve °Outcome: Progressing °Goal: Respiratory complications will improve °Outcome: Progressing °  °Problem: Nutrition: °Goal: Adequate nutrition will be maintained °Outcome: Progressing °  °Problem: Coping: °Goal: Level of anxiety will decrease °Outcome: Progressing °  °

## 2024-04-08 NOTE — TOC Initial Note (Signed)
 Transition of Care [facility]) - Initial/Assessment Note    Patient Details  Name: Troy Jackson MRN: [id number] Date of Birth: 1945-05-04  [facility] CM/SW Contact:    Roxie KANDICE Stain, RN Phone Number: 04/08/2024, 3:03 PM  Clinical Narrative:                 Spoke to patient regarding transition needs.  Patient lives with wife and states he can afford his medications.  Patient is agreeable to home health and defers to his wife to chose agency. Patient requests this RNCM to call wife tomorrow am since she takes naps during the afternoons. Patient declines walker.  Address, Phone number and PCP verified. ICM will continue to follow for needs.  Expected Discharge Plan: Home w Home Health Services Barriers to Discharge: Continued Medical Work up   Patient Goals and CMS Choice Patient states their goals for this hospitalization and ongoing recovery are:: get better CMS Medicare.gov Compare Post Acute Care list provided to:: Patient Choice offered to / list presented to : Patient      Expected Discharge Plan and Services   Discharge Planning Services: CM Consult Post Acute Care Choice: Home Health Living arrangements for the past 2 months: Single Family Home                                      Prior Living Arrangements/Services Living arrangements for the past 2 months: Single Family Home Lives with:: Spouse Patient language and need for interpreter reviewed:: Yes Do you feel safe going back to the place where you live?: Yes      Need for Family Participation in Patient Care: Yes (Comment) Care giver support system in place?: Yes (comment) Current home services: DME (nebulizer) Criminal Activity/Legal Involvement Pertinent to Current Situation/Hospitalization: No - Comment as needed  Activities of Daily Living   ADL Screening (condition at time of admission) Independently performs ADLs?: No Does the patient have a NEW difficulty with  bathing/dressing/toileting/self-feeding that is expected to last >3 days?: Yes (Initiates electronic notice to provider for possible OT consult) Does the patient have a NEW difficulty with getting in/out of bed, walking, or climbing stairs that is expected to last >3 days?: Yes (Initiates electronic notice to provider for possible PT consult) Does the patient have a NEW difficulty with communication that is expected to last >3 days?: No Is the patient deaf or have difficulty hearing?: No Does the patient have difficulty seeing, even when wearing glasses/contacts?: No Does the patient have difficulty concentrating, remembering, or making decisions?: Yes  Permission Sought/Granted                  Emotional Assessment   Attitude/Demeanor/Rapport: Engaged Affect (typically observed): Accepting Orientation: : Oriented to Self, Oriented to Place, Oriented to  Time, Oriented to Situation Alcohol / Substance Use: Not Applicable Psych Involvement: No (comment)  Admission diagnosis:  Shortness of breath [R06.02] Hypoxia [R09.02] Multifocal pneumonia [J18.8] Community acquired pneumonia, unspecified laterality [J18.9] Acute congestive heart failure, unspecified heart failure type [facility]) [I50.9] Patient Active Problem List   Diagnosis Date Noted   Multifocal pneumonia 04/06/2024   Chronic pain 07/25/2018   Atrial fibrillation with RVR (HCC) 07/27/2017   Sepsis due to pneumonia (HCC) 07/24/2017   CAD (coronary artery disease) 07/24/2017   NSTEMI (non-ST elevated myocardial infarction) (HCC) 02/11/2016   Chest pain 02/03/2016   COPD (chronic obstructive pulmonary  disease) (HCC) 02/03/2016   Unstable angina (HCC)    Hyperlipidemia 01/31/2011   Tobacco abuse 01/31/2011   PCP:  Shayne Anes, MD Pharmacy:   [facility] [address] ([address]), [place] - [address] [place] ([place]) [place] [address] Phone: [phone] Fax: [phone]     Social Drivers of  Health (SDOH) Social History: SDOH Screenings   Food Insecurity: No Food Insecurity (04/06/2024)  Housing: Low Risk (04/06/2024)  Transportation Needs: No Transportation Needs (04/06/2024)  Utilities: Not At Risk (04/06/2024)  Social Connections: Socially Integrated (04/06/2024)  Tobacco Use: Medium Risk (04/07/2024)   SDOH Interventions:     Readmission Risk Interventions     No data to display

## 2024-04-09 DIAGNOSIS — I4891 Unspecified atrial fibrillation: Secondary | ICD-10-CM | POA: Diagnosis not present

## 2024-04-09 DIAGNOSIS — J188 Other pneumonia, unspecified organism: Secondary | ICD-10-CM | POA: Diagnosis not present

## 2024-04-09 LAB — BASIC METABOLIC PANEL WITH GFR
Anion gap: 9 (ref 5–15)
BUN: 25 mg/dL — ABNORMAL HIGH (ref 8–23)
CO2: 30 mmol/L (ref 22–32)
Calcium: 8.4 mg/dL — ABNORMAL LOW (ref 8.9–10.3)
Chloride: 100 mmol/L (ref 98–111)
Creatinine, Ser: 0.89 mg/dL (ref 0.61–1.24)
GFR, Estimated: >60 mL/min
Glucose, Bld: 110 mg/dL — ABNORMAL HIGH (ref 70–99)
Potassium: 3.8 mmol/L (ref 3.5–5.1)
Sodium: 139 mmol/L (ref 135–145)

## 2024-04-09 MED ORDER — VITAMIN D 25 MCG (1000 UNIT) PO TABS
2000.0000 [IU] | ORAL_TABLET | Freq: Every day | ORAL | Status: DC
  Administered 2024-04-10 – 2024-04-15 (×6): 2000 [IU] via ORAL
  Filled 2024-04-09 (×6): qty 2

## 2024-04-09 MED ORDER — BUDESONIDE 0.25 MG/2ML IN SUSP
0.2500 mg | Freq: Two times a day (BID) | RESPIRATORY_TRACT | Status: DC
  Administered 2024-04-09 – 2024-04-15 (×12): 0.25 mg via RESPIRATORY_TRACT
  Filled 2024-04-09 (×12): qty 2

## 2024-04-09 MED ORDER — EZETIMIBE 10 MG PO TABS
10.0000 mg | ORAL_TABLET | Freq: Every evening | ORAL | Status: DC
  Administered 2024-04-09 – 2024-04-14 (×6): 10 mg via ORAL
  Filled 2024-04-09 (×6): qty 1

## 2024-04-09 MED ORDER — ALPRAZOLAM 0.5 MG PO TABS
0.5000 mg | ORAL_TABLET | Freq: Every day | ORAL | Status: DC
  Administered 2024-04-10 – 2024-04-15 (×6): 0.5 mg via ORAL
  Filled 2024-04-09 (×6): qty 1

## 2024-04-09 MED ORDER — SALINE SPRAY 0.65 % NA SOLN
1.0000 | NASAL | Status: DC | PRN
  Filled 2024-04-09: qty 44

## 2024-04-09 MED ORDER — SENNOSIDES-DOCUSATE SODIUM 8.6-50 MG PO TABS
1.0000 | ORAL_TABLET | ORAL | Status: DC | PRN
  Administered 2024-04-09 – 2024-04-10 (×2): 1 via ORAL
  Filled 2024-04-09 (×2): qty 1

## 2024-04-09 MED ORDER — REVEFENACIN 175 MCG/3ML IN SOLN
175.0000 ug | Freq: Every day | RESPIRATORY_TRACT | Status: DC
  Administered 2024-04-09 – 2024-04-15 (×7): 175 ug via RESPIRATORY_TRACT
  Filled 2024-04-09 (×7): qty 3

## 2024-04-09 MED ORDER — METHYLPREDNISOLONE SODIUM SUCC 40 MG IJ SOLR
40.0000 mg | Freq: Every day | INTRAMUSCULAR | Status: DC
  Administered 2024-04-09 – 2024-04-10 (×2): 40 mg via INTRAVENOUS
  Filled 2024-04-09 (×2): qty 1

## 2024-04-09 MED ORDER — ARFORMOTEROL TARTRATE 15 MCG/2ML IN NEBU
15.0000 ug | INHALATION_SOLUTION | Freq: Two times a day (BID) | RESPIRATORY_TRACT | Status: DC
  Administered 2024-04-09 – 2024-04-15 (×12): 15 ug via RESPIRATORY_TRACT
  Filled 2024-04-09 (×12): qty 2

## 2024-04-09 NOTE — Progress Notes (Signed)
 Mobility Specialist Progress Note;    04/09/24 1114  Mobility  Activity Ambulated with assistance  Level of Assistance Contact guard assist, steadying assist  Assistive Device Front wheel walker  Distance Ambulated (ft) 200 ft  Activity Response Tolerated well  Mobility Referral Yes  Mobility visit 1 Mobility  Mobility Specialist Start Time (ACUTE ONLY) 1114  Mobility Specialist Stop Time (ACUTE ONLY) 1142  Mobility Specialist Time Calculation (min) (ACUTE ONLY) 28 min   Pt received in bed, agreeable to mobility. No physical assistance needed, CGA for safety. Pt found on 9L/min, ambulated on 10L/min SPO2 88-90%. No c/o throughout. Pt returned to bed, unable to get SPO2 above 88% on 10L/min, increased to 12L/min SPO2 89%. Call bell and personal belongings in reach. All needs met. Bed alarm on. RN aware. Visitors in room.   Florine Oak Mobility Specialist Please Neurosurgeon or [employer] [phone]

## 2024-04-09 NOTE — Plan of Care (Signed)
   Problem: Education: Goal: Knowledge of General Education information will improve Description: Including pain rating scale, medication(s)/side effects and non-pharmacologic comfort measures Outcome: Progressing   Problem: Activity: Goal: Risk for activity intolerance will decrease Outcome: Progressing   Problem: Nutrition: Goal: Adequate nutrition will be maintained Outcome: Progressing

## 2024-04-09 NOTE — Progress Notes (Signed)
 " PROGRESS NOTE Troy Jackson  FMW:[id number] DOB: 08/12/1945 DOA: 04/06/2024 PCP: Shayne Anes, MD  Brief Narrative/Hospital Course: Troy Jackson is a 79 y.o. male with PMH of CAD s/p PCI, Afib, HTN, HLD, and COPD presented to the ED with shortness of breath and dyspnea on exertion since last Tuesday along with chills fever loss of appetite, hypoxic and wheezing and gasping for air  In the ED patient tachycardic tachypneic,w/ hypoxia (required BiPAP initially and transitioned to HFNC 6L/min; on RA PTA). Labs notable for pro-BNP 4494. CXR showed focal consolidations in LLL and RML. EKG showed AFRVR. Patient was admitted for multifocal pneumonia along with A-fib with RVR and HFpEF exacerbation. Patient needed BiPAP initially in the ED and was admitted for further management  Subjective: Seen and examined No new complaints Having nasal drainage He reports he just saw Dr. Calton has no questions Overnight patient is afebrile BP stable remains still on [place] 5l at rest,labs reviewed stable electrolytes and blood sugar.  Assessment and plan:  Multifocal pneumonia Acute hypoxic respiratory failure: Presenting with shortness of breath hypoxia chest x-ray with multifocal consolidation although no fever and no WBC count. Blood culture NGTD,MRSA PCR negative, no sputum to culture, Legionella/strep urine antigen pending. Completed azithromycin , continue ceftriaxone  x 7 days Continue on IS, FV,Breo, prn nebs, add nasal saline spray> still needing [place]  OOB PTOT-continue on weaning down oxygen .Will add RT eval.  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 Change to her pleurae Brovana  and Pulmicort , we will supplemental IV in the setting of hypoxia and pneumonia.  Continue PRN bronchodilator.  Persistent A-fib with RVR Hypotension, iatrogenic: History of A-fib on Eliquis , Cardizem , PRN metoprolol  PTA. RVR likely due to underlying infection.  Patient converted to NSR-continue  current oral Cardizem , Eliquis . Echo 1/26 shows EF 55-60%, G2 DD.  HFpEF: S/p 2 doses of IV Lasix , volume status stable.    CAD with history of NSTEMI s/p PCI to LCx in 01/2016 Hyperlipidemia Hypertension: No chest pain.  BP controlled on Cardizem .Troponin minimally elevated from demand ischemia in the setting of hypoxia.  Continue Eliquis , statin. hold rest of BP meds  Hypokalemia: Replaced  Mild anemia: Hemoglobin stable.  Monitor  Deconditioning Mobility: PT Orders: Active PT Follow up Rec: Home Health Pt1/26/2026 1100    DVT prophylaxis: Eliquis  Code Status:   Code Status: Full Code Family Communication: plan of care discussed with patient at bedside.  I reached out to wife this morning updated extensively she is retired respiratory therapist-she is concerned patient is not getting adequate food,-she had questions why he is not getting albuterol  nebs-   I explained that it is ordered PRN and with him being A-fib RVR-need to be careful with use of albuterol  round-the-clock I did add RD eval given her concern about nutrition, also ordered RT eval. Informed I-S flutter valve and bronchodilator and antibiotic has been ordered-to treat his pneumonia hypoxia. she was concerned about no one picking the phone yesterday at station. I provided therapeutic listening, and answered all the questions and concerns to the best of my ability. She is requesting to get udpate regularly and I assured that I will be calling her every day with updates on her husband. I put in PT eval again to mobilize. Again seen the patient at the bedside, with patient's daughter and wife and updated care plan  Patient status is: Remains hospitalized because of severity of illness Level of care: Progressive   Dispo: The patient is from: home  Anticipated disposition: TBD Objective: Vitals last 24 hrs: Vitals:   04/08/24 1924 04/08/24 2304 04/09/24 0433 04/09/24 0700  BP: 135/63 (!) 115/56 (!) 118/50  (!) 127/56  Pulse: 78 73 72 78  Resp: 16 20 18 20   Temp: 97.6 F (36.4 C) 97.9 F (36.6 C) 97.8 F (36.6 C) 98.7 F (37.1 C)  TempSrc: Oral Oral Oral Oral  SpO2: 90% 98% 98% 95%  Weight:      Height:       Physical Examination: General exam: AAOX3 HEENT:Oral mucosa moist, Ear/Nose WNL grossly Respiratory system: Bilaterally distant breath sounds, mild wheezing. Cardiovascular system: S1 & S2 +, No JVD. Gastrointestinal system: Abdomen soft,NT,ND, BS+ Nervous System: Alert, awake, Non focal Extremities: extremities warm, leg edema neg Skin: Warm, no rashes MSK: Normal muscle bulk,tone, power   Medications reviewed:  Scheduled Meds:  ALPRAZolam   1 mg Oral QHS   apixaban   5 mg Oral BID   diltiazem   240 mg Oral Daily   fluticasone  furoate-vilanterol  1 puff Inhalation Daily   pantoprazole   40 mg Oral Daily   simvastatin   20 mg Oral Daily  Continuous Infusions:  cefTRIAXone  (ROCEPHIN )  IV 1 g (04/08/24 0839)   Diet Order             Diet 2 gram sodium Room service appropriate? Yes; Fluid consistency: Thin  Diet effective now                    Unresulted Labs (From admission, onward)     Start     Ordered   04/08/24 1741  Expectorated Sputum Assessment w Gram Stain, Rflx to Resp Cult  Once,   R        04/08/24 1741   04/08/24 0748  Strep pneumoniae urinary antigen  Once,   R        04/08/24 0749   04/08/24 0748  Legionella Pneumophila Serogp 1 Ur Ag  Once,   R        04/08/24 0749           Data Reviewed: I have personally reviewed following labs and imaging studies ( see epic result tab) CBC: Recent Labs  Lab 04/06/24 0848 04/06/24 1058 04/07/24 0205  WBC 9.6 9.4 8.7  NEUTROABS 8.3*  --   --   HGB 12.2* 11.9* 11.7*  HCT 36.4* 35.4* 34.6*  MCV 93.8 93.7 92.5  PLT 203 207 214   CMP: Recent Labs  Lab 04/06/24 0848 04/06/24 1058 04/07/24 0205 04/08/24 0018 04/09/24 0237  NA 134*  --  136 135 139  K 4.0  --  3.8 3.3* 3.8  CL 100  --  100 97*  100  CO2 24  --  27 28 30   GLUCOSE 165*  --  116* 105* 110*  BUN 17  --  22 28* 25*  CREATININE 1.14 1.09 1.16 0.96 0.89  CALCIUM 9.0  --  8.3* 8.3* 8.4*  MG  --   --   --  2.0  --    GFR: Estimated Creatinine Clearance: 77.8 mL/min (by C-G formula based on SCr of 0.89 mg/dL). Recent Labs  Lab 04/06/24 0848  AST 20  ALT 14  ALKPHOS 54  BILITOT 0.9  PROT 6.4*  ALBUMIN 3.1*   No results for input(s): LIPASE, AMYLASE in the last 168 hours. No results for input(s): AMMONIA in the last 168 hours. Coagulation Profile:  Recent Labs  Lab 04/06/24 0848  INR 1.2   Antimicrobials/Microbiology:  Anti-infectives (From admission, onward)    Start     Dose/Rate Route Frequency Ordered Stop   04/07/24 1000  cefTRIAXone  (ROCEPHIN ) 1 g in sodium chloride  0.9 % 100 mL IVPB        1 g 200 mL/hr over 30 Minutes Intravenous Every 24 hours 04/06/24 1046 04/11/24 0959   04/06/24 1100  azithromycin  (ZITHROMAX ) tablet 500 mg        500 mg Oral Daily 04/06/24 1046 04/08/24 0840   04/06/24 1015  cefTRIAXone  (ROCEPHIN ) 1 g in sodium chloride  0.9 % 100 mL IVPB        1 g 200 mL/hr over 30 Minutes Intravenous  Once 04/06/24 1001 04/06/24 1038   04/06/24 1015  doxycycline  (VIBRAMYCIN ) 100 mg in sodium chloride  0.9 % 250 mL IVPB  Status:  Discontinued        100 mg 125 mL/hr over 120 Minutes Intravenous Every 12 hours 04/06/24 1001 04/06/24 1046         Component Value Date/Time   SDES EXPECTORATED SPUTUM 04/08/2024 0748   SPECREQUEST NONE 04/08/2024 0748   CULT  04/06/2024 0853    NO GROWTH 3 DAYS Performed at [facility], [address] [place], [place] [address]    REPTSTATUS 04/08/2024 FINAL 04/08/2024 9251  Procedures: Mennie LAMY, MD [facility] 04/09/2024, 1:46 PM   "

## 2024-04-09 NOTE — TOC Progression Note (Signed)
 Transition of Care [facility]) - Progression Note    Patient Details  Name: Troy Jackson MRN: [id number] Date of Birth: 1945-04-14  Transition of Care [facility]) CM/SW Contact  Roxie KANDICE Stain, RN Phone Number: 04/09/2024, 5:12 PM  Clinical Narrative:      Beatris to wife Rhoda regarding home health needs. Becky requesting [facility] home health. Joane with [facility] excepted referral.  Expected Discharge Plan: Home w Home Health Services Barriers to Discharge: Continued Medical Work up               Expected Discharge Plan and Services   Discharge Planning Services: CM Consult Post Acute Care Choice: Home Health Living arrangements for the past 2 months: Single Family Home                                       Social Drivers of Health (SDOH) Interventions SDOH Screenings   Food Insecurity: No Food Insecurity (04/06/2024)  Housing: Low Risk (04/06/2024)  Transportation Needs: No Transportation Needs (04/06/2024)  Utilities: Not At Risk (04/06/2024)  Social Connections: Socially Integrated (04/06/2024)  Tobacco Use: Medium Risk (04/07/2024)    Readmission Risk Interventions     No data to display

## 2024-04-09 NOTE — Progress Notes (Signed)
 "  Rounding Note   Patient Name: Troy Jackson Date of Encounter: 04/09/2024  [facility] Cardiologist: Melvenia Favela, MD   Subjective Feels better. Some SOB and cough  Scheduled Meds:  ALPRAZolam   1 mg Oral QHS   apixaban   5 mg Oral BID   diltiazem   240 mg Oral Daily   fluticasone  furoate-vilanterol  1 puff Inhalation Daily   pantoprazole   40 mg Oral Daily   simvastatin   20 mg Oral Daily   Continuous Infusions:  cefTRIAXone  (ROCEPHIN )  IV 1 g (04/09/24 0833)   PRN Meds: acetaminophen , albuterol    Vital Signs  Vitals:   04/08/24 1924 04/08/24 2304 04/09/24 0433 04/09/24 0700  BP: 135/63 (!) 115/56 (!) 118/50 (!) 127/56  Pulse: 78 73 72 78  Resp: 16 20 18 20   Temp: 97.6 F (36.4 C) 97.9 F (36.6 C) 97.8 F (36.6 C) 98.7 F (37.1 C)  TempSrc: Oral Oral Oral Oral  SpO2: 90% 98% 98% 95%  Weight:      Height:        Intake/Output Summary (Last 24 hours) at 04/09/2024 0852 Last data filed at 04/09/2024 0754 Gross per 24 hour  Intake 840 ml  Output 1300 ml  Net -460 ml      04/06/2024    8:50 AM 11/14/2019   10:48 AM 03/20/2019    8:12 AM  Last 3 Weights  Weight (lbs) 190 lb 214 lb 3.2 oz 210 lb  Weight (kg) 86.183 kg 97.16 kg 95.255 kg      Telemetry NSR. Converted from Afib yesterday am 10:40 - Personally Reviewed  ECG  none - Personally Reviewed  Physical Exam  GEN: No acute distress.   Neck: No JVD Cardiac: RRR, no murmurs, rubs, or gallops.  Respiratory: scattered rhonchi GI: Soft, nontender, non-distended  MS: No edema; No deformity. Neuro:  Nonfocal  Psych: Normal affect   Labs High Sensitivity Troponin:  No results for input(s): TROPONINIHS in the last 720 hours.  Recent Labs  Lab 04/06/24 1058  TRNPT 53*       Chemistry Recent Labs  Lab 04/06/24 0848 04/06/24 1058 04/07/24 0205 04/08/24 0018 04/09/24 0237  NA 134*  --  136 135 139  K 4.0  --  3.8 3.3* 3.8  CL 100  --  100 97* 100  CO2 24  --  27 28 30   GLUCOSE 165*   --  116* 105* 110*  BUN 17  --  22 28* 25*  CREATININE 1.14   < > 1.16 0.96 0.89  CALCIUM 9.0  --  8.3* 8.3* 8.4*  MG  --   --   --  2.0  --   PROT 6.4*  --   --   --   --   ALBUMIN 3.1*  --   --   --   --   AST 20  --   --   --   --   ALT 14  --   --   --   --   ALKPHOS 54  --   --   --   --   BILITOT 0.9  --   --   --   --   GFRNONAA >60   < > >60 >60 >60  ANIONGAP 11  --  10 10 9    < > = values in this interval not displayed.    Lipids No results for input(s): CHOL, TRIG, HDL, LABVLDL, LDLCALC, CHOLHDL in the last 168 hours.  Hematology Recent Labs  Lab 04/06/24 0848 04/06/24 1058 04/07/24 0205  WBC 9.6 9.4 8.7  RBC 3.88* 3.78* 3.74*  HGB 12.2* 11.9* 11.7*  HCT 36.4* 35.4* 34.6*  MCV 93.8 93.7 92.5  MCH 31.4 31.5 31.3  MCHC 33.5 33.6 33.8  RDW 13.3 13.5 13.5  PLT 203 207 214   Thyroid  No results for input(s): TSH, FREET4 in the last 168 hours.  BNP Recent Labs  Lab 04/06/24 0848  PROBNP 4,494.0*    DDimer No results for input(s): DDIMER in the last 168 hours.   Radiology  ECHOCARDIOGRAM COMPLETE Result Date: 04/08/2024    ECHOCARDIOGRAM REPORT   Patient Name:   Troy Jackson Date of Exam: 04/08/2024 Medical Rec #:  [id number]        Height:       73.2 in Accession #:    [id number]       Weight:       190.0 lb Date of Birth:  06/19/1945         BSA:          2.109 m Patient Age:    78 years         BP:           108/69 mmHg Patient Gender: M                HR:           72 bpm. Exam Location:  Inpatient Procedure: 2D Echo, Cardiac Doppler, Color Doppler and Intracardiac            Opacification Agent (Both Spectral and Color Flow Doppler were            utilized during procedure). Indications:    Atrial Fibrillation  History:        Patient has prior history of Echocardiogram examinations, most                 recent 07/27/2017. CAD and Acute MI, COPD, Arrythmias:Atrial                 Fibrillation; Risk Factors:Dyslipidemia.  Sonographer:    Sherlean Dubin Referring Phys: MAUDE HERO Madison Direnzo  Sonographer Comments: Image acquisition challenging due to respiratory motion. IMPRESSIONS  1. Left ventricular ejection fraction, by estimation, is 55 to 60%. The left ventricle has normal function. The left ventricle has no regional wall motion abnormalities. There is mild concentric left ventricular hypertrophy. Left ventricular diastolic parameters are consistent with Grade II diastolic dysfunction (pseudonormalization).  2. Right ventricular systolic function is normal. The right ventricular size is normal.  3. Left atrial size was mildly dilated.  4. The mitral valve is degenerative. Trivial mitral valve regurgitation. No evidence of mitral stenosis.  5. The aortic valve is normal in structure. Aortic valve regurgitation is not visualized. No aortic stenosis is present.  6. The inferior vena cava is normal in size with greater than 50% respiratory variability, suggesting right atrial pressure of 3 mmHg. FINDINGS  Left Ventricle: Left ventricular ejection fraction, by estimation, is 55 to 60%. The left ventricle has normal function. The left ventricle has no regional wall motion abnormalities. Definity  contrast agent was given IV to delineate the left ventricular  endocardial borders. The left ventricular internal cavity size was normal in size. There is mild concentric left ventricular hypertrophy. Left ventricular diastolic parameters are consistent with Grade II diastolic dysfunction (pseudonormalization). Right Ventricle: The right ventricular size is normal. No increase in right ventricular wall thickness. Right  ventricular systolic function is normal. Left Atrium: Left atrial size was mildly dilated. Right Atrium: Right atrial size was normal in size. Prominent Chiari network. Pericardium: There is no evidence of pericardial effusion. Mitral Valve: The mitral valve is degenerative in appearance. Mild mitral annular calcification. Trivial mitral valve regurgitation. No  evidence of mitral valve stenosis. MV peak gradient, 6.2 mmHg. The mean mitral valve gradient is 2.0 mmHg. Tricuspid Valve: The tricuspid valve is normal in structure. Tricuspid valve regurgitation is not demonstrated. No evidence of tricuspid stenosis. Aortic Valve: The aortic valve is normal in structure. Aortic valve regurgitation is not visualized. No aortic stenosis is present. Aortic valve mean gradient measures 3.5 mmHg. Aortic valve peak gradient measures 6.4 mmHg. Aortic valve area, by VTI measures 2.26 cm. Pulmonic Valve: The pulmonic valve was normal in structure. Pulmonic valve regurgitation is not visualized. No evidence of pulmonic stenosis. Aorta: The aortic root is normal in size and structure. Venous: The inferior vena cava is normal in size with greater than 50% respiratory variability, suggesting right atrial pressure of 3 mmHg. IAS/Shunts: No atrial level shunt detected by color flow Doppler.  LEFT VENTRICLE PLAX 2D LVIDd:         5.40 cm   Diastology LVIDs:         3.90 cm   LV e' medial:    6.64 cm/s LV PW:         1.10 cm   LV E/e' medial:  18.2 LV IVS:        1.30 cm   LV e' lateral:   7.80 cm/s LVOT diam:     1.90 cm   LV E/e' lateral: 15.5 LV SV:         62 LV SV Index:   29 LVOT Area:     2.84 cm  RIGHT VENTRICLE             IVC RV Basal diam:  3.70 cm     IVC diam: 2.00 cm RV Mid diam:    3.30 cm RV S prime:     11.60 cm/s TAPSE (M-mode): 2.0 cm LEFT ATRIUM             Index        RIGHT ATRIUM           Index LA diam:        4.20 cm 1.99 cm/m   RA Area:     18.80 cm LA Vol (A2C):   86.0 ml 40.77 ml/m  RA Volume:   55.50 ml  26.31 ml/m LA Vol (A4C):   49.0 ml 23.23 ml/m LA Biplane Vol: 66.2 ml 31.38 ml/m  AORTIC VALVE AV Area (Vmax):    2.28 cm AV Area (Vmean):   2.27 cm AV Area (VTI):     2.26 cm AV Vmax:           126.00 cm/s AV Vmean:          85.950 cm/s AV VTI:            0.272 m AV Peak Grad:      6.4 mmHg AV Mean Grad:      3.5 mmHg LVOT Vmax:         101.50 cm/s LVOT  Vmean:        68.900 cm/s LVOT VTI:          0.217 m LVOT/AV VTI ratio: 0.80  AORTA Ao Root diam: 3.20 cm MITRAL VALVE MV Area (PHT): 2.80 cm  SHUNTS MV Area VTI:   1.70 cm     Systemic VTI:  0.22 m MV Peak grad:  6.2 mmHg     Systemic Diam: 1.90 cm MV Mean grad:  2.0 mmHg MV Vmax:       1.24 m/s MV Vmean:      70.2 cm/s MV Decel Time: 271 msec MR Peak grad: 7.4 mmHg MR Vmax:      136.00 cm/s MV E velocity: 121.00 cm/s MV A velocity: 105.00 cm/s MV E/A ratio:  1.15 Morene Brownie Electronically signed by Morene Brownie Signature Date/Time: 04/08/2024/2:02:49 PM    Final     Cardiac Studies Echo: IMPRESSIONS     1. Left ventricular ejection fraction, by estimation, is 55 to 60%. The  left ventricle has normal function. The left ventricle has no regional  wall motion abnormalities. There is mild concentric left ventricular  hypertrophy. Left ventricular diastolic  parameters are consistent with Grade II diastolic dysfunction  (pseudonormalization).   2. Right ventricular systolic function is normal. The right ventricular  size is normal.   3. Left atrial size was mildly dilated.   4. The mitral valve is degenerative. Trivial mitral valve regurgitation.  No evidence of mitral stenosis.   5. The aortic valve is normal in structure. Aortic valve regurgitation is  not visualized. No aortic stenosis is present.   6. The inferior vena cava is normal in size with greater than 50%  respiratory variability, suggesting right atrial pressure of 3 mmHg.   Patient Profile   79 y.o. male with a hx of CAD status post PCI, hyperlipidemia, tobacco abuse, COPD, GERD and prior paroxysmal atrial fibrillation chronically anticoagulated with Eliquis  who is being seen 04/06/2024 for the evaluation of A-fib RVR and the setting of multifocal pneumonia at the request of Dr. Georgina.   Assessment & Plan  Atrial fibrillation with RVR in setting of CAP. Now converted back to NSR. Back on diltiazem   CD 240 mg daily  which is what he was on at home. Continue Eliquis . Echo looked good with normal LV and valve function.  CAD s/p remote PCI to LCx in 2017. No active angina.  HLD on simvastatin  20 mg  HTN  well controlled.  5.   CAP with acute hypoxic respiratory failure. Per hospitalist team  [facility] will sign off.   Medication Recommendations:  as per [facility] Other recommendations (labs, testing, etc):  none Follow up as an outpatient:  recommend follow up office visit with us  in 3-4 weeks.      For questions or updates, please contact [facility] Please consult [email or web address] for contact info under       Signed, Caili Escalera, MD  04/09/2024, 8:52 AM    "

## 2024-04-10 ENCOUNTER — Telehealth (HOSPITAL_COMMUNITY): Payer: Self-pay

## 2024-04-10 ENCOUNTER — Other Ambulatory Visit (HOSPITAL_COMMUNITY): Payer: Self-pay

## 2024-04-10 ENCOUNTER — Inpatient Hospital Stay (HOSPITAL_COMMUNITY)

## 2024-04-10 DIAGNOSIS — I4891 Unspecified atrial fibrillation: Secondary | ICD-10-CM | POA: Diagnosis not present

## 2024-04-10 DIAGNOSIS — R918 Other nonspecific abnormal finding of lung field: Secondary | ICD-10-CM | POA: Diagnosis not present

## 2024-04-10 DIAGNOSIS — J9621 Acute and chronic respiratory failure with hypoxia: Secondary | ICD-10-CM | POA: Diagnosis not present

## 2024-04-10 DIAGNOSIS — J188 Other pneumonia, unspecified organism: Secondary | ICD-10-CM | POA: Diagnosis not present

## 2024-04-10 LAB — RESPIRATORY PANEL BY PCR

## 2024-04-10 LAB — CBC
HCT: 39.1 % (ref 39.0–52.0)
Hemoglobin: 13.1 g/dL (ref 13.0–17.0)
MCH: 31 pg (ref 26.0–34.0)
MCHC: 33.5 g/dL (ref 30.0–36.0)
MCV: 92.7 fL (ref 80.0–100.0)
Platelets: 366 10*3/uL (ref 150–400)
RBC: 4.22 MIL/uL (ref 4.22–5.81)
RDW: 13 % (ref 11.5–15.5)
WBC: 10.1 10*3/uL (ref 4.0–10.5)
nRBC: 0 % (ref 0.0–0.2)

## 2024-04-10 LAB — BASIC METABOLIC PANEL WITH GFR
Anion gap: 9 (ref 5–15)
BUN: 24 mg/dL — ABNORMAL HIGH (ref 8–23)
CO2: 31 mmol/L (ref 22–32)
Calcium: 9.1 mg/dL (ref 8.9–10.3)
Chloride: 97 mmol/L — ABNORMAL LOW (ref 98–111)
Creatinine, Ser: 0.85 mg/dL (ref 0.61–1.24)
GFR, Estimated: >60 mL/min
Glucose, Bld: 206 mg/dL — ABNORMAL HIGH (ref 70–99)
Potassium: 4.2 mmol/L (ref 3.5–5.1)
Sodium: 137 mmol/L (ref 135–145)

## 2024-04-10 LAB — PRO BRAIN NATRIURETIC PEPTIDE: Pro Brain Natriuretic Peptide: 2508 pg/mL — ABNORMAL HIGH

## 2024-04-10 LAB — BLOOD GAS, ARTERIAL
Acid-Base Excess: 6.8 mmol/L — ABNORMAL HIGH (ref 0.0–2.0)
Bicarbonate: 31.3 mmol/L — ABNORMAL HIGH (ref 20.0–28.0)
Drawn by: 441371
O2 Saturation: 88.4 %
Patient temperature: 36.6
pCO2 arterial: 42 mmHg (ref 32–48)
pH, Arterial: 7.48 — ABNORMAL HIGH (ref 7.35–7.45)
pO2, Arterial: 53 mmHg — ABNORMAL LOW (ref 83–108)

## 2024-04-10 LAB — PROCALCITONIN: Procalcitonin: 0.51 ng/mL

## 2024-04-10 LAB — LEGIONELLA PNEUMOPHILA SEROGP 1 UR AG: L. pneumophila Serogp 1 Ur Ag: NEGATIVE

## 2024-04-10 LAB — EXPECTORATED SPUTUM ASSESSMENT W GRAM STAIN, RFLX TO RESP C

## 2024-04-10 LAB — TROPONIN T, HIGH SENSITIVITY
Troponin T High Sensitivity: 39 ng/L — ABNORMAL HIGH (ref 0–19)
Troponin T High Sensitivity: 40 ng/L — ABNORMAL HIGH (ref 0–19)

## 2024-04-10 LAB — SEDIMENTATION RATE: Sed Rate: 100 mm/h — ABNORMAL HIGH (ref 0–16)

## 2024-04-10 MED ORDER — FUROSEMIDE 10 MG/ML IJ SOLN
40.0000 mg | Freq: Two times a day (BID) | INTRAMUSCULAR | Status: AC
  Administered 2024-04-10 – 2024-04-13 (×7): 40 mg via INTRAVENOUS
  Filled 2024-04-10 (×7): qty 4

## 2024-04-10 MED ORDER — VANCOMYCIN HCL IN DEXTROSE 1-5 GM/200ML-% IV SOLN
1000.0000 mg | Freq: Two times a day (BID) | INTRAVENOUS | Status: DC
  Administered 2024-04-11 – 2024-04-13 (×5): 1000 mg via INTRAVENOUS
  Filled 2024-04-10 (×6): qty 200

## 2024-04-10 MED ORDER — METHYLPREDNISOLONE SODIUM SUCC 40 MG IJ SOLR
40.0000 mg | Freq: Every day | INTRAMUSCULAR | Status: AC
  Administered 2024-04-11 – 2024-04-14 (×4): 40 mg via INTRAVENOUS
  Filled 2024-04-10 (×4): qty 1

## 2024-04-10 MED ORDER — NYSTATIN 100000 UNIT/ML MT SUSP
5.0000 mL | Freq: Four times a day (QID) | OROMUCOSAL | Status: DC
  Administered 2024-04-10 – 2024-04-15 (×17): 500000 [IU] via ORAL
  Filled 2024-04-10 (×16): qty 5

## 2024-04-10 MED ORDER — FUROSEMIDE 10 MG/ML IJ SOLN
40.0000 mg | INTRAMUSCULAR | Status: AC
  Administered 2024-04-10: 40 mg via INTRAVENOUS
  Filled 2024-04-10: qty 4

## 2024-04-10 MED ORDER — BOOST PLUS PO LIQD
237.0000 mL | Freq: Two times a day (BID) | ORAL | Status: DC
  Administered 2024-04-10 – 2024-04-15 (×10): 237 mL via ORAL
  Filled 2024-04-10 (×11): qty 237

## 2024-04-10 MED ORDER — SODIUM CHLORIDE 0.9 % IV SOLN: 1.0000 g | INTRAVENOUS | Status: DC

## 2024-04-10 MED ORDER — VANCOMYCIN HCL 1750 MG/350ML IV SOLN
1750.0000 mg | Freq: Once | INTRAVENOUS | Status: AC
  Administered 2024-04-10: 1750 mg via INTRAVENOUS
  Filled 2024-04-10: qty 350

## 2024-04-10 MED ORDER — AZITHROMYCIN 500 MG PO TABS: 500.0000 mg | ORAL_TABLET | Freq: Every day | ORAL | Status: DC

## 2024-04-10 MED ORDER — DRONEDARONE HCL 400 MG PO TABS
400.0000 mg | ORAL_TABLET | Freq: Two times a day (BID) | ORAL | Status: DC
  Administered 2024-04-10 – 2024-04-15 (×10): 400 mg via ORAL
  Filled 2024-04-10 (×11): qty 1

## 2024-04-10 MED ORDER — PIPERACILLIN-TAZOBACTAM 3.375 G IVPB
3.3750 g | Freq: Three times a day (TID) | INTRAVENOUS | Status: AC
  Administered 2024-04-10 – 2024-04-14 (×14): 3.375 g via INTRAVENOUS
  Filled 2024-04-10 (×13): qty 50

## 2024-04-10 NOTE — Progress Notes (Signed)
 Physical Therapy Treatment Patient Details Name: Troy Jackson MRN: [id number] DOB: 1945-10-07 Today's Date: 04/10/2024   History of Present Illness 79 y.o. male who presented to ED 04/06/24 with  SOB/DOE and found to have multifocal CAP c/b AFRVR and HFpEF exacerbation. PMH:CAD s/p PCI, Afib, HTN, HLD, and COPD    PT Comments  Pt resting in bed on arrival, agreeable to session and eager for OOB mobility. Pt limited in safe mobility this session by increased supplemental O2 demands with pt desatting to 76% on HHFNC 40L 93% FiO2 with short in room ambulation. Pt requiring cues for breathing techniques throughout activity, with pt able to rebound >90% with seated rest and pursed lip breathing in ~60 seconds. Pt requiring min A to complete bed mobility, and grossly CGA to perform transfers sit<>stand and gait with RW for support. Gait distance limited by decreased cardiopulmonary endurance and HHFNC restraints. Pt up in chair at end of session with all needs met. Pt continues to benefit from skilled PT services to progress toward functional mobility goals.     If plan is discharge home, recommend the following: A little help with walking and/or transfers;A little help with bathing/dressing/bathroom;Assistance with cooking/housework;Help with stairs or ramp for entrance   Can travel by private vehicle        Equipment Recommendations  Rolling walker (2 wheels)    Recommendations for Other Services       Precautions / Restrictions Precautions Precautions: Fall;Other (comment) Recall of Precautions/Restrictions: Intact Precaution/Restrictions Comments: watch O2 (does not wear at baseline) HHFNC Restrictions Weight Bearing Restrictions Per Provider Order: No     Mobility  Bed Mobility Overal bed mobility: Needs Assistance Bed Mobility: Supine to Sit     Supine to sit: Min assist, HOB elevated, Used rails     General bed mobility comments: pt coming to sit on L EOB, able to bring LEs  to and off EOB, min A to eleavte trunk and scoot out to EOB    Transfers Overall transfer level: Needs assistance Equipment used: Rolling walker (2 wheels) Transfers: Sit to/from Stand Sit to Stand: Contact guard assist, From elevated surface           General transfer comment: pt standing from slightly elevated EOB, cues for hand placement    Ambulation/Gait Ambulation/Gait assistance: Contact guard assist Gait Distance (Feet): 18 Feet Assistive device: Rolling walker (2 wheels) Gait Pattern/deviations: Step-through pattern, Trunk flexed, WFL(Within Functional Limits) Gait velocity: slowed     General Gait Details: CGA for safety, distance limited by HHFNC to wall, cues for focusing on breathing tecnhiques without talking during ambultaion, Spo2 to 76% during gait, rebounding to 93% with seated rest and cues for PLB in ~60 seconds   Stairs             Wheelchair Mobility     Tilt Bed    Modified Rankin (Stroke Patients Only)       Balance Overall balance assessment: Needs assistance Sitting-balance support: No upper extremity supported, Feet supported Sitting balance-Leahy Scale: Good     Standing balance support: No upper extremity supported, Bilateral upper extremity supported, During functional activity Standing balance-Leahy Scale: Fair                              Musician Communication: No apparent difficulties  Cognition Arousal: Alert Behavior During Therapy: WFL for tasks assessed/performed  Following commands: Intact      Cueing Cueing Techniques: Verbal cues  Exercises      General Comments General comments (skin integrity, edema, etc.): SpO2 92-96% on HHFNC 40L 92% FiO2, with gait pt dropping to 76%, rebounding >90 with seated rest and cues for PLB      Pertinent Vitals/Pain      Home Living                          Prior Function             PT Goals (current goals can now be found in the care plan section) Acute Rehab PT Goals PT Goal Formulation: With patient Time For Goal Achievement: 04/22/24 Progress towards PT goals: Not progressing toward goals - comment (increased supplemtnal O2 demand)    Frequency    Min 2X/week      PT Plan      Co-evaluation              AM-PAC PT 6 Clicks Mobility   Outcome Measure  Help needed turning from your back to your side while in a flat bed without using bedrails?: None Help needed moving from lying on your back to sitting on the side of a flat bed without using bedrails?: A Little Help needed moving to and from a bed to a chair (including a wheelchair)?: A Little Help needed standing up from a chair using your arms (e.g., wheelchair or bedside chair)?: A Little Help needed to walk in hospital room?: A Little Help needed climbing 3-5 steps with a railing? : A Little 6 Click Score: 19    End of Session Equipment Utilized During Treatment: Gait belt;Oxygen  Activity Tolerance: Patient tolerated treatment well Patient left: with call bell/phone within reach;in chair;with family/visitor present;with nursing/sitter in room Nurse Communication: Mobility status;Other (comment) (sats with ambulation) PT Visit Diagnosis: Muscle weakness (generalized) (M62.81);Difficulty in walking, not elsewhere classified (R26.2);Pain Pain - part of body:  (back)     Time: 8871-8798 PT Time Calculation (min) (ACUTE ONLY): 33 min  Charges:    $Therapeutic Activity: 23-37 mins PT General Charges $$ ACUTE PT VISIT: 1 Visit                     Myrlene Riera R. PTA Acute Rehabilitation Services Office: [phone]   Therisa CHRISTELLA Boor 04/10/2024, 12:12 PM

## 2024-04-10 NOTE — Progress Notes (Signed)
 " PROGRESS NOTE Troy Jackson  FMW:[id number] DOB: [birth year] DOA: 04/06/2024 PCP: Shayne Anes, MD  Brief Narrative/Hospital Course: Troy Jackson is a 79 y.o. male with PMH of CAD s/p PCI, Afib, HTN, HLD, and COPD presented to the ED with shortness of breath and dyspnea on exertion since last Tuesday along with chills fever loss of appetite, hypoxic and wheezing and gasping for air  In the ED patient tachycardic tachypneic,w/ hypoxia (required BiPAP initially and transitioned to HFNC 6L/min; on RA PTA). Labs notable for pro-BNP 4494. CXR showed focal consolidations in LLL and RML. EKG showed AFRVR. Patient was admitted for multifocal pneumonia along with A-fib with RVR and HFpEF exacerbation. Patient needed BiPAP initially in the ED and was admitted for further management Patient continued on IV antibiotics, seen by cardiology for A-fib with RVR  Subjective: Seen and examined Patient currently on nonrebreather Nursing states after having his breakfast patient is more hypoxic  However patient denies any shortness of breath, is comfortable, had some cough, no other new symptoms Remains afebrile BP stable chest x-ray ordered for the morning  Assessment and plan:  Multifocal pneumonia Acute hypoxic respiratory failure w/ respiratory distress: Presenting with shortness of breath hypoxia chest x-ray with multifocal consolidation - although no fever and no WBC count. Initially needed BiPAP along with IV Lasix  x 2 and admitted on personal antibiotics. Has been on HFNC since then. Blood culture NGTD,MRSA PCR negative, no sputum to culture, Legionella/strep urine antigen pending. Repeat chest x-ray shows this morning needing nonrebreather-stat chest x-ray w/ worsened b/l and airspace opacities-concerning for worsening CHF, ? If worsening pneumonia- but no fever. ABG-no CO2 retention pO2 53 pH 7.4> changing to HHFNC. Checking BNP and troponin.  Will do IV Lasix -cardiology notified, plan to repeat  chest x-ray in the a.m.  He completed azithromycin . Cont ceftriaxone  x 7 days Continue on IS, FV, prn nebs, nasal saline spray, Cont Brovana  Pulmicort  Yupleri. No suspicion of PE, his echo was unchanged with preserved EF-and on Eliquis  already Will request pulmonary to evaluate him today. Notified PCCM team.  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  Continue PRN bronchodilator.  Acute on chronic HFpEF: S/p 2 doses of IV Lasix  In ED. chest x-ray with interstitial opacities worsening suspect CHF exacerbation checking troponin x 2 and proBNP giving IV Lasix .  Notified cardiology Net IO Since Admission: -548.96 mL [04/10/24 1028]   Persistent A-fib with RVR Hypotension, iatrogenic: History of A-fib PTA on Eliquis , Cardizem , PRN metoprolol . On admit RVR likely due to underlying infection  Patient converted to NSR- Now on cardizem , Eliquis . Echo 1/26 shows EF 55-60%, G2 DD.  CAD with history of NSTEMI s/p PCI to LCx in 01/2016 Hyperlipidemia Hypertension:  BP controlled on Cardizem .Troponin minimally elevated from demand ischemia in the setting of hypoxia. Continue Eliquis , statin. hold rest of BP meds.  Checking troponins.  Hypokalemia: Replaced.Follow-up labs  Chronic anxiety on Xanax : He takes Xanax  twice daily at home continue the same.  Mild anemia: Hemoglobin stable.  Monitor  Deconditioning/debility Failure to thrive Mobility: Continue PT OT, RD eval augment diet. PT Orders: Active PT Follow up Rec: Home Health Pt1/26/2026 1100    DVT prophylaxis: Eliquis  Code Status:   Code Status: Full Code Family Communication: plan of care discussed with patient at bedside.  1/27 I reached out to  wife and met with them at bedside along with the daughter -extensively updated and provided  therapeutic listening 1/28-called and updated wife on the phone about his worsening respiratory status  Patient status is: Remains hospitalized because of severity of illness Level of care: Progressive   Dispo: The patient is from: home            Anticipated disposition: TBD Objective: Vitals last 24 hrs: Vitals:   04/10/24 0836 04/10/24 0903 04/10/24 0905 04/10/24 0910  BP: 130/71     Pulse: 73 69    Resp: 16 (!) 21    Temp: 97.8 F (36.6 C)     TempSrc: Oral     SpO2: 92% 96% 94% 95%  Weight:      Height:       Physical Examination: General exam: AAOX3, not in distress HEENT:Oral mucosa moist, Ear/Nose WNL grossly Respiratory system: Bilaterally diminished breath sound, no use of accessory muscles  Cardiovascular system: S1 & S2 +, No JVD. Gastrointestinal system: Abdomen soft,NT,ND, BS+ Nervous System: Alert, awake, Non focal Extremities: extremities warm, leg edema neg Skin: Warm, no rashes MSK: Normal muscle bulk,tone, power   Medications reviewed:  Scheduled Meds:  ALPRAZolam   0.5 mg Oral Q0600   ALPRAZolam   1 mg Oral QHS   apixaban   5 mg Oral BID   arformoterol   15 mcg Nebulization BID   budesonide  (PULMICORT ) nebulizer solution  0.25 mg Nebulization BID   cholecalciferol   2,000 Units Oral Daily   diltiazem   240 mg Oral Daily   dronedarone   400 mg Oral BID WC   ezetimibe   10 mg Oral QPM   furosemide   40 mg Intravenous Q12H   methylPREDNISolone  (SOLU-MEDROL ) injection  40 mg Intravenous Daily   pantoprazole   40 mg Oral Daily   revefenacin   175 mcg Nebulization Daily   simvastatin   20 mg Oral Daily  Continuous Infusions:  [START ON 04/11/2024] cefTRIAXone  (ROCEPHIN )  IV     Diet Order             Diet 2 gram sodium Room service appropriate? Yes; Fluid consistency: Thin  Diet effective now                    Unresulted Labs (From admission, onward)     Start     Ordered   04/11/24 0500  CBC  Tomorrow morning,   R       Question:  Specimen  collection method  Answer:  Lab=Lab collect   04/10/24 0740   04/11/24 0500  Basic metabolic panel  Daily,   R     Question:  Specimen collection method  Answer:  Lab=Lab collect   04/10/24 1027   04/08/24 1741  Expectorated Sputum Assessment w Gram Stain, Rflx to Resp Cult  Once,   R        04/08/24 1741   04/08/24 0748  Strep pneumoniae urinary antigen  Once,   R        04/08/24 0749   04/08/24 0748  Legionella Pneumophila Serogp 1 Ur Ag  Once,   R        04/08/24 0749           Data Reviewed: I have personally reviewed following labs and imaging studies ( see epic result tab) CBC: Recent Labs  Lab 04/06/24 0848 04/06/24 1058 04/07/24 0205 04/10/24 0957  WBC 9.6 9.4 8.7 10.1  NEUTROABS 8.3*  --   --   --   HGB 12.2*  11.9* 11.7* 13.1  HCT 36.4* 35.4* 34.6* 39.1  MCV 93.8 93.7 92.5 92.7  PLT 203 207 214 366   CMP: Recent Labs  Lab 04/06/24 0848 04/06/24 1058 04/07/24 0205 04/08/24 0018 04/09/24 0237 04/10/24 0957  NA 134*  --  136 135 139 137  K 4.0  --  3.8 3.3* 3.8 4.2  CL 100  --  100 97* 100 97*  CO2 24  --  27 28 30 31   GLUCOSE 165*  --  116* 105* 110* 206*  BUN 17  --  22 28* 25* 24*  CREATININE 1.14 1.09 1.16 0.96 0.89 0.85  CALCIUM 9.0  --  8.3* 8.3* 8.4* 9.1  MG  --   --   --  2.0  --   --    GFR: Estimated Creatinine Clearance: 81.5 mL/min (by C-G formula based on SCr of 0.85 mg/dL). Recent Labs  Lab 04/06/24 0848  AST 20  ALT 14  ALKPHOS 54  BILITOT 0.9  PROT 6.4*  ALBUMIN 3.1*   No results for input(s): LIPASE, AMYLASE in the last 168 hours. No results for input(s): AMMONIA in the last 168 hours. Coagulation Profile:  Recent Labs  Lab 04/06/24 0848  INR 1.2   Antimicrobials/Microbiology: Anti-infectives (From admission, onward)    Start     Dose/Rate Route Frequency Ordered Stop   04/11/24 0900  cefTRIAXone  (ROCEPHIN ) 1 g in sodium chloride  0.9 % 100 mL IVPB        1 g 200 mL/hr over 30 Minutes Intravenous Every 24 hours  04/10/24 0848 04/13/24 0859   04/07/24 1000  cefTRIAXone  (ROCEPHIN ) 1 g in sodium chloride  0.9 % 100 mL IVPB  Status:  Discontinued        1 g 200 mL/hr over 30 Minutes Intravenous Every 24 hours 04/06/24 1046 04/10/24 0905   04/06/24 1100  azithromycin  (ZITHROMAX ) tablet 500 mg        500 mg Oral Daily 04/06/24 1046 04/08/24 0840   04/06/24 1015  cefTRIAXone  (ROCEPHIN ) 1 g in sodium chloride  0.9 % 100 mL IVPB        1 g 200 mL/hr over 30 Minutes Intravenous  Once 04/06/24 1001 04/06/24 1038   04/06/24 1015  doxycycline  (VIBRAMYCIN ) 100 mg in sodium chloride  0.9 % 250 mL IVPB  Status:  Discontinued        100 mg 125 mL/hr over 120 Minutes Intravenous Every 12 hours 04/06/24 1001 04/06/24 1046         Component Value Date/Time   SDES EXPECTORATED SPUTUM 04/08/2024 0748   SPECREQUEST NONE 04/08/2024 0748   CULT  04/06/2024 0853    NO GROWTH 4 DAYS Performed at [facility], [address], [place], [place] [address]    REPTSTATUS 04/08/2024 FINAL 04/08/2024 9251    Procedures:  Mennie LAMY, MD [facility] 04/10/2024, 10:58 AM   "

## 2024-04-10 NOTE — Consult Note (Signed)
 "  NAME:  Troy Jackson, MRN:  [id number], DOB:  09/20/1945, LOS: 4 ADMISSION DATE:  04/06/2024, CONSULTATION DATE:  04/10/24 REFERRING MD:  TRH, CHIEF COMPLAINT:  SOB   History of Present Illness:  79 year old man w/ hx of longstanding smoking, COPD, Afib on AC, HFpEF p/w worsening SOB and chills that started maybe 2 weeks ago.  He minimizes symptoms so have to get history from wife.  +cough with whitish sputum.  + wheezing improved somewhat with home nebs (wife is former RT).  Wife finally convinced him to go to hospital.  Here working diagnosis of uncontrolled afib leading to decompensated HFpEF +/- CAP; O2 needs have progressed despite diuresis and conversion to sinus rhythm (HHFNC, sats low mid 90s) so PCCM consulted.  He states overall he feels better than admit.    States no obvious inciting new meds or exposures.  Not currently smoking.  Sats have been 80s at home past week but again he minimizes DOE symptoms.  Flu/covid neg.  Patient has lost good deal of weight recently.  He states this is on purpose as he is dieting.  Pertinent  Medical History   Past Medical History:  Diagnosis Date   Anxiety    COPD (chronic obstructive pulmonary disease) (HCC)    Coronary artery disease 2017   stent   Diverticulitis    Dyspnea    doe, with exertion   Dysrhythmia    atrial fibrillation   GERD (gastroesophageal reflux disease)    Hyperlipidemia    Hypertension    Pneumonia    2019   Tobacco abuse 2017     Significant Hospital Events: Including procedures, antibiotic start and stop dates in addition to other pertinent events     Interim History / Subjective:  consult  Objective    Blood pressure 130/71, pulse 77, temperature 97.8 F (36.6 C), temperature source Oral, resp. rate 20, height 6' 1.2 (1.859 m), weight 86.2 kg, SpO2 90%.    FiO2 (%):  [90 %] 90 %   Intake/Output Summary (Last 24 hours) at 04/10/2024 1227 Last data filed at 04/10/2024 1100 Gross per 24 hour  Intake  240 ml  Output 1250 ml  Net -1010 ml   Filed Weights   04/06/24 0850  Weight: 86.2 kg    Examination: General: no distress HENT: +thrush Lungs: faint crackles bases Cardiovascular: regular, no loud murmurs Abdomen: soft, +BS Extremities: minimal edema Neuro: moves to command GU: condom cath in place  Labs/imaging reviewed  Resolved problem list   Assessment and Plan  Acute (on probably chronic) type 1 respiratory failure w/ Bilateral opacities on CXR- differential remains ongoing pulmonary edema (most likely), HCAP (new O2 needs would be c/w), or a inflammatory pneumonitis on top of baseline advanced emphysema.  Given O2 need level reasonable to treat all 3.  No s/s of aspiration. Vanc/zosyn , check Pct and sputum culture Diuresis as tolerated by BP and renal function Check ESR, start steroids Triple neb therapy O2 for sats > 88%, seems comfortable on HHFNC Go more by symptoms (WOB/mentation) then sats when accessing efficacy of therapy, suspect he is chronically hypoxemic although has low normal Hgb which argues against but could just have concurrent anemia Family updated, will follow   Labs   CBC: Recent Labs  Lab 04/06/24 0848 04/06/24 1058 04/07/24 0205 04/10/24 0957  WBC 9.6 9.4 8.7 10.1  NEUTROABS 8.3*  --   --   --   HGB 12.2* 11.9* 11.7* 13.1  HCT  36.4* 35.4* 34.6* 39.1  MCV 93.8 93.7 92.5 92.7  PLT 203 207 214 366    Basic Metabolic Panel: Recent Labs  Lab 04/06/24 0848 04/06/24 1058 04/07/24 0205 04/08/24 0018 04/09/24 0237 04/10/24 0957  NA 134*  --  136 135 139 137  K 4.0  --  3.8 3.3* 3.8 4.2  CL 100  --  100 97* 100 97*  CO2 24  --  27 28 30 31   GLUCOSE 165*  --  116* 105* 110* 206*  BUN 17  --  22 28* 25* 24*  CREATININE 1.14 1.09 1.16 0.96 0.89 0.85  CALCIUM 9.0  --  8.3* 8.3* 8.4* 9.1  MG  --   --   --  2.0  --   --    GFR: Estimated Creatinine Clearance: 81.5 mL/min (by C-G formula based on SCr of 0.85 mg/dL). Recent Labs  Lab  04/06/24 0848 04/06/24 0912 04/06/24 1057 04/06/24 1058 04/07/24 0205 04/10/24 0957  WBC 9.6  --   --  9.4 8.7 10.1  LATICACIDVEN  --  1.8 1.6  --   --   --     Liver Function Tests: Recent Labs  Lab 04/06/24 0848  AST 20  ALT 14  ALKPHOS 54  BILITOT 0.9  PROT 6.4*  ALBUMIN 3.1*   No results for input(s): LIPASE, AMYLASE in the last 168 hours. No results for input(s): AMMONIA in the last 168 hours.  ABG    Component Value Date/Time   PHART 7.48 (H) 04/10/2024 1000   PCO2ART 42 04/10/2024 1000   PO2ART 53 (L) 04/10/2024 1000   HCO3 31.3 (H) 04/10/2024 1000   TCO2 27 07/31/2017 2317   O2SAT 88.4 04/10/2024 1000     Coagulation Profile: Recent Labs  Lab 04/06/24 0848  INR 1.2    Cardiac Enzymes: No results for input(s): CKTOTAL, CKMB, CKMBINDEX, TROPONINI in the last 168 hours.  HbA1C: No results found for: HGBA1C  CBG: No results for input(s): GLUCAP in the last 168 hours.  Review of Systems:    Positive Symptoms in bold:  Constitutional fevers, chills, weight loss, fatigue, anorexia, malaise  Eyes decreased vision, double vision, eye irritation  Ears, Nose, Mouth, Throat sore throat, trouble swallowing, sinus congestion  Cardiovascular chest pain, paroxysmal nocturnal dyspnea, lower ext edema, palpitations   Respiratory SOB, cough, DOE, hemoptysis, wheezing  Gastrointestinal nausea, vomiting, diarrhea  Genitourinary burning with urination, trouble urinating  Musculoskeletal joint aches, joint swelling, back pain  Integumentary  rashes, skin lesions  Neurological focal weakness, focal numbness, trouble speaking, headaches  Psychiatric depression, anxiety, confusion  Endocrine polyuria, polydipsia, cold intolerance, heat intolerance  Hematologic abnormal bruising, abnormal bleeding, unexplained nose bleeds  Allergic/Immunologic recurrent infections, hives, swollen lymph nodes     Past Medical History:  He,  has a past medical  history of Anxiety, COPD (chronic obstructive pulmonary disease) (HCC), Coronary artery disease (2017), Diverticulitis, Dyspnea, Dysrhythmia, GERD (gastroesophageal reflux disease), Hyperlipidemia, Hypertension, Pneumonia, and Tobacco abuse (2017).   Surgical History:   Past Surgical History:  Procedure Laterality Date   CARDIAC CATHETERIZATION N/A 02/03/2016   Procedure: Left Heart Cath and Coronary Angiography;  Surgeon: Peter M Jordan, MD;  Location: [facility] INVASIVE CV LAB;  Service: Cardiovascular;  Laterality: N/A;   CARDIAC CATHETERIZATION N/A 02/03/2016   Procedure: Coronary Stent Intervention;  Surgeon: Peter M Jordan, MD;  Location: [facility] INVASIVE CV LAB;  Service: Cardiovascular;  Laterality: N/A;   COLONOSCOPY     CORONARY ANGIOPLASTY WITH STENT  PLACEMENT  02/03/2016   SHOULDER SURGERY Right    as young adult   SHOULDER SURGERY Left    as child   SPINAL CORD STIMULATOR INSERTION N/A 07/25/2018   Procedure: LUMBAR SPINAL CORD STIMULATOR INSERTION;  Surgeon: Burnetta Aures, MD;  Location: [facility] OR;  Service: Orthopedics;  Laterality: N/A;  2 hrs     Social History:   reports that he quit smoking about 9 years ago. His smoking use included cigarettes. He started smoking about 69 years ago. He has a 60 pack-year smoking history. He has never used smokeless tobacco. He reports that he does not currently use alcohol after a past usage of about 20.0 standard drinks of alcohol per week. He reports that he does not use drugs.   Family History:  His family history includes Alcohol abuse in his brother, father, and paternal grandfather; Alzheimer's disease in his mother; Heart attack in his father and paternal grandfather; Heart disease in his father; Lung cancer in his father.   Allergies Allergies[1]   Home Medications  Prior to Admission medications  Medication Sig Start Date End Date Taking? Authorizing Provider  albuterol  (PROVENTIL  HFA;VENTOLIN  HFA) 108 (90 Base) MCG/ACT inhaler Inhale 2  puffs into the lungs every 6 (six) hours as needed for wheezing or shortness of breath.    Yes [provider]  albuterol  (PROVENTIL ) (2.5 MG/3ML) 0.083% nebulizer solution Take 2.5 mg by nebulization every 4 (four) hours as needed for shortness of breath or wheezing.   Yes [provider]  ALPRAZolam  (XANAX ) 0.5 MG tablet Take 0.5-1 mg by mouth See admin instructions. Take 0.5 mg every morning and take 1 mg every evening 01/13/11  Yes [provider]  apixaban  (ELIQUIS ) 5 MG TABS tablet Take 5 mg by mouth 2 (two) times daily.   Yes [provider]  budesonide -formoterol  (SYMBICORT) 80-4.5 MCG/ACT inhaler Inhale 2 puffs into the lungs 2 (two) times daily.    Yes [provider]  Cholecalciferol  (VITAMIN D3) 50 MCG (2000 UT) capsule Take 2,000 Units by mouth daily.   Yes [provider]  dicyclomine  (BENTYL ) 10 MG capsule Take 10 mg by mouth every 8 (eight) hours as needed for spasms.    Yes [provider]  diltiazem  (CARDIZEM  CD) 240 MG 24 hr capsule Take 1 capsule (240 mg total) by mouth daily. 08/03/17  Yes Akula, Vijaya, MD  ezetimibe  (ZETIA ) 10 MG tablet Take 10 mg by mouth every evening.   Yes [provider]  Fluticasone -Umeclidin-Vilant (TRELEGY ELLIPTA) 100-62.5-25 MCG/ACT AEPB Inhale 1 puff into the lungs in the morning and at bedtime.   Yes [provider]  ibuprofen (ADVIL) 200 MG tablet Take 400 mg by mouth every 8 (eight) hours as needed for mild pain (pain score 1-3) or moderate pain (pain score 4-6).   Yes [provider]  metoprolol  tartrate (LOPRESSOR ) 25 MG tablet Take 0.5 tablets (12.5 mg total) by mouth 2 (two) times daily as needed (as needed fOR HR> 120/min.). Patient taking differently: Take 25 mg by mouth 2 (two) times daily as needed (as needed fOR HR> 120/min.). 08/02/17 04/06/24 Yes Akula, Vijaya, MD  nitroGLYCERIN  (NITROSTAT ) 0.4 MG SL tablet Place 1 tablet (0.4 mg total) under the tongue  every 5 (five) minutes as needed for chest pain. 08/02/17  Yes Akula, Vijaya, MD  simvastatin  (ZOCOR ) 20 MG tablet Take 20 mg by mouth every evening.   Yes [provider]     Critical care time: N/A              [  1]  Allergies Allergen Reactions   Statins Other (See Comments)    Myalgia with some statins, on simvastatin  currently per spouse   "

## 2024-04-10 NOTE — Progress Notes (Signed)
 Pharmacy Antibiotic Note  Troy Jackson is a 79 y.o. male admitted on 04/06/2024 with pneumonia.  Pharmacy has been consulted for vancomycin  + Zosyn  dosing.  Plan: Vancomycin  1750mg  IV x1 then 1000mg  IV q12h - est AUC 451 Zosyn  EI q8h Follow Cr, LOT Cx Vancomycin  levels PRN  Height: 6' 1.2 (185.9 cm) Weight: 86.2 kg (190 lb) IBW/kg (Calculated) : 80.36  Temp (24hrs), Avg:98 F (36.7 C), Min:97.6 F (36.4 C), Max:98.4 F (36.9 C)  Recent Labs  Lab 04/06/24 0848 04/06/24 0912 04/06/24 1057 04/06/24 1058 04/07/24 0205 04/08/24 0018 04/09/24 0237 04/10/24 0957  WBC 9.6  --   --  9.4 8.7  --   --  10.1  CREATININE 1.14  --   --  1.09 1.16 0.96 0.89 0.85  LATICACIDVEN  --  1.8 1.6  --   --   --   --   --     Estimated Creatinine Clearance: 81.5 mL/min (by C-G formula based on SCr of 0.85 mg/dL).    Allergies[1]    Ozell Jamaica, PharmD, BCPS, [facility] Clinical Pharmacist [phone] Please check AMION for all [facility] numbers 04/10/2024      [1]  Allergies Allergen Reactions   Statins Other (See Comments)    Myalgia with some statins, on simvastatin  currently per spouse

## 2024-04-10 NOTE — Telephone Encounter (Signed)
 Pharmacy Patient Advocate Encounter  Insurance verification completed.    The patient is insured through [employer]   Ran test claim for Multaq  400mg  tablet and the current 30 day co-pay is $0.   This test claim was processed through [facility]- copay amounts may vary at other pharmacies due to [employer], or as the patient moves through the different stages of their insurance plan.

## 2024-04-10 NOTE — Progress Notes (Signed)
 Initial Nutrition Assessment  DOCUMENTATION CODES:   Severe malnutrition in context of acute illness/injury. While he meets criteria in the setting of acute illness, it is likely that his underlying comorbid conditions are a significant contributor.   INTERVENTION:  Add Boost Plus po BID, each supplement provides 360 kcal and 14 grams of protein  Add Magic cup BID as a snack to be mixed with Boost, each supplement provides 290 kcal and 9 grams of protein To be mixed with Boost supplement  Liberalize diet to maximize PO intake Discussed importance of adequate protein/calorie intake to preserve lean body mass and progress with therapy   NUTRITION DIAGNOSIS:  Severe Malnutrition related to acute illness as evidenced by severe muscle depletion, moderate fat depletion.  GOAL:   Patient will meet greater than or equal to 90% of their needs  MONITOR:   PO intake, Supplement acceptance, Weight trends  REASON FOR ASSESSMENT:   Consult Assessment of nutrition requirement/status  ASSESSMENT:   Pt with PMH significant for: CAD s/p PCI, afib, HTN, HLD, COPD, GERD and anxiety. Admitted for multifocal PNA and acute hypoxic respiratory failure w/ respiratory distress.  Developed worsening hypoxia overnight. CXR noted with worsening interstitial opacities. Initially on BiPAP along with IV Lasix  x2. Required nonrebreather mask this morning and now on HHFNC. Pulmonary critical care has been consulted.   Met with patient's wife and daughter in hallway as patient working with therapy and undergoing hygiene at time of visit. Wife was able to report most of patient recent nutrition history. States he has not been eating well for last week prior to admission. Prior to that, stable intake consisting of cereal in the morning w/ coffee, sandwich with chips at lunch, and protein/starch/veg for supper. No issues chewing or swallowing at baseline. 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   Met with patient later in the afternoon to perform NFPE. Pleasant and conversant, but with labored breathing and difficulty phonating because of this. Significant muscle and fat depletions on exam. Meets criteria for severe malnutrition in the context of chronic illness. Discussed need for increased protein/calorie intake as his chronic illnesses and difficulty breathing have contributed to his current debilitated state. He verbalized understanding and willing to receive ONS while admitted. States he took care of his mother in his younger years and is aware of ONS and the intended impact.   Admit/Current weight: 86.2 kg   Patient UBW reported as around 86-88 kg over last 6-12 months. No edema noted. Per wife, he tries to maintain his current weight. Bowels stable. RN reported administering his Senokot this morning as he is very particular about his bowel habits.    Intake/Output Summary (Last 24 hours) at 04/10/2024 2022 Last data filed at 04/10/2024 1630 Gross per 24 hour  Intake 509.03 ml  Output 1650 ml  Net -1140.97 ml   Net IO Since Admission: -889.93 mL [04/10/24 2022]  Average Meal Intake: 1/26: 50-75% intake x 3 recorded meals 1/27: 50% x2 recorded  meals  Nutritionally Relevant Medications: Scheduled Meds:  cholecalciferol   2,000 Units Oral Daily   furosemide   40 mg Intravenous Q12H   lactose free nutrition  237 mL Oral BID BM   methylPREDNISolone  (SOLU-MEDROL ) injection  40 mg Intravenous Daily   pantoprazole   40 mg Oral Daily   Continuous Infusions:  piperacillin -tazobactam (ZOSYN )  IV 3.375 g (04/10/24 1323)   vancomycin      PRN Meds:.acetaminophen , albuterol ,  senna-docusate, sodium chloride   Labs Reviewed: K+ 3.3>4.2 (wdl) CBG ranges from 110-206 mg/dL over the last 24 hours    NUTRITION - FOCUSED PHYSICAL EXAM:  Flowsheet Row Most Recent Value  Orbital Region No depletion  Upper Arm Region Moderate depletion  Thoracic  and Lumbar Region Moderate depletion  Buccal Region Unable to assess  Temple Region Moderate depletion  Clavicle Bone Region Moderate depletion  Clavicle and Acromion Bone Region Severe depletion  Scapular Bone Region Severe depletion  Dorsal Hand Moderate depletion  Patellar Region Severe depletion  Anterior Thigh Region Severe depletion  Posterior Calf Region Moderate depletion  Hair Reviewed  Eyes Reviewed  Mouth Reviewed  Skin Reviewed  Nails Reviewed    Diet Order:   Diet Order             Diet regular Room service appropriate? Yes; Fluid consistency: Thin  Diet effective now                   EDUCATION NEEDS:   Education needs have been addressed  Skin:  Skin Assessment: Reviewed RN Assessment  Last BM:  1/27  Height:  Ht Readings from Last 1 Encounters:  04/06/24 6' 1.2 (1.859 m)   Weight:  Wt Readings from Last 1 Encounters:  04/06/24 86.2 kg    Ideal Body Weight:  83.6 kg  BMI:  Body mass index is 24.93 kg/m.  Estimated Nutritional Needs:   Kcal:  2000-2200 kcals  Protein:  100-115g  Fluid:  >2L/day  Blair Deaner MS, RD, LDN Registered Dietitian I Clinical Nutrition RD Inpatient Contact Info in Amion

## 2024-04-10 NOTE — Progress Notes (Signed)
 "  Rounding Note   Patient Name: Troy Jackson Date of Encounter: 04/10/2024  [facility] Cardiologist: Maan Zarcone, MD   Subjective Since I saw patient yesterday has developed worsening hypoxia and CXR with worsening bilateral infiltrates.  Scheduled Meds:  ALPRAZolam   0.5 mg Oral Q0600   ALPRAZolam   1 mg Oral QHS   apixaban   5 mg Oral BID   arformoterol   15 mcg Nebulization BID   budesonide  (PULMICORT ) nebulizer solution  0.25 mg Nebulization BID   cholecalciferol   2,000 Units Oral Daily   diltiazem   240 mg Oral Daily   ezetimibe   10 mg Oral QPM   methylPREDNISolone  (SOLU-MEDROL ) injection  40 mg Intravenous Daily   pantoprazole   40 mg Oral Daily   revefenacin   175 mcg Nebulization Daily   simvastatin   20 mg Oral Daily   Continuous Infusions:  [START ON 04/11/2024] cefTRIAXone  (ROCEPHIN )  IV     PRN Meds: acetaminophen , albuterol , senna-docusate, sodium chloride    Vital Signs  Vitals:   04/10/24 0836 04/10/24 0903 04/10/24 0905 04/10/24 0910  BP: 130/71     Pulse: 73 69    Resp: 16 (!) 21    Temp: 97.8 F (36.6 C)     TempSrc: Oral     SpO2: 92% 96% 94% 95%  Weight:      Height:        Intake/Output Summary (Last 24 hours) at 04/10/2024 1019 Last data filed at 04/10/2024 0700 Gross per 24 hour  Intake 240 ml  Output 800 ml  Net -560 ml      04/06/2024    8:50 AM 11/14/2019   10:48 AM 03/20/2019    8:12 AM  Last 3 Weights  Weight (lbs) 190 lb 214 lb 3.2 oz 210 lb  Weight (kg) 86.183 kg 97.16 kg 95.255 kg      Telemetry NSR. Yesterday went back into Afib with controlled rate. In Afib from 9:24 to 21:17. In NSR since then  ECG  none - Personally Reviewed  Physical Exam  GEN: No acute distress.   Neck: No JVD Cardiac: RRR, no murmurs, rubs, or gallops.  Respiratory: bilateral wheezes/rales.  GI: Soft, nontender, non-distended  MS: No edema; No deformity. Neuro:  Nonfocal  Psych: Normal affect   Labs High Sensitivity Troponin:  No  results for input(s): TROPONINIHS in the last 720 hours.  Recent Labs  Lab 04/06/24 1058  TRNPT 53*       Chemistry Recent Labs  Lab 04/06/24 0848 04/06/24 1058 04/07/24 0205 04/08/24 0018 04/09/24 0237  NA 134*  --  136 135 139  K 4.0  --  3.8 3.3* 3.8  CL 100  --  100 97* 100  CO2 24  --  27 28 30   GLUCOSE 165*  --  116* 105* 110*  BUN 17  --  22 28* 25*  CREATININE 1.14   < > 1.16 0.96 0.89  CALCIUM 9.0  --  8.3* 8.3* 8.4*  MG  --   --   --  2.0  --   PROT 6.4*  --   --   --   --   ALBUMIN 3.1*  --   --   --   --   AST 20  --   --   --   --   ALT 14  --   --   --   --   ALKPHOS 54  --   --   --   --  BILITOT 0.9  --   --   --   --   GFRNONAA >60   < > >60 >60 >60  ANIONGAP 11  --  10 10 9    < > = values in this interval not displayed.    Lipids No results for input(s): CHOL, TRIG, HDL, LABVLDL, LDLCALC, CHOLHDL in the last 168 hours.  Hematology Recent Labs  Lab 04/06/24 0848 04/06/24 1058 04/07/24 0205  WBC 9.6 9.4 8.7  RBC 3.88* 3.78* 3.74*  HGB 12.2* 11.9* 11.7*  HCT 36.4* 35.4* 34.6*  MCV 93.8 93.7 92.5  MCH 31.4 31.5 31.3  MCHC 33.5 33.6 33.8  RDW 13.3 13.5 13.5  PLT 203 207 214   Thyroid  No results for input(s): TSH, FREET4 in the last 168 hours.  BNP Recent Labs  Lab 04/06/24 0848  PROBNP 4,494.0*    DDimer No results for input(s): DDIMER in the last 168 hours.   Radiology  DG Chest Port 1 View Result Date: 04/10/2024 EXAM: 1 VIEW(S) XRAY OF THE CHEST 04/10/2024 07:55:00 AM COMPARISON: 04/06/2024 CLINICAL HISTORY: Shortness of breath. FINDINGS: LINES, TUBES AND DEVICES: Spinal Stimulator in place, tip overlying the midline. LUNGS AND PLEURA: Worsened bilateral interstitial and airspace opacities compared to prior. No pleural effusion. No pneumothorax. HEART AND MEDIASTINUM: No acute abnormality of the cardiac and mediastinal silhouettes. BONES AND SOFT TISSUES: No acute osseous abnormality. IMPRESSION: 1. Worsened bilateral  interstitial and airspace opacities compared to prior. 2. Spinal stimulator in place with tip overlying the midline. Electronically signed by: Evalene Coho MD 04/10/2024 08:00 AM EST RP Workstation: [id number]   ECHOCARDIOGRAM COMPLETE Result Date: 04/08/2024    ECHOCARDIOGRAM REPORT   Patient Name:   Troy Jackson Date of Exam: 04/08/2024 Medical Rec #:  [id number]        Height:       73.2 in Accession #:    [id number]       Weight:       190.0 lb Date of Birth:  29-Dec-1945         BSA:          2.109 m Patient Age:    78 years         BP:           108/69 mmHg Patient Gender: M                HR:           72 bpm. Exam Location:  Inpatient Procedure: 2D Echo, Cardiac Doppler, Color Doppler and Intracardiac            Opacification Agent (Both Spectral and Color Flow Doppler were            utilized during procedure). Indications:    Atrial Fibrillation  History:        Patient has prior history of Echocardiogram examinations, most                 recent 07/27/2017. CAD and Acute MI, COPD, Arrythmias:Atrial                 Fibrillation; Risk Factors:Dyslipidemia.  Sonographer:    Sherlean Dubin Referring Phys: MAUDE HERO Ariaunna Longsworth  Sonographer Comments: Image acquisition challenging due to respiratory motion. IMPRESSIONS  1. Left ventricular ejection fraction, by estimation, is 55 to 60%. The left ventricle has normal function. The left ventricle has no regional wall motion abnormalities. There is mild concentric left ventricular hypertrophy. Left ventricular diastolic parameters are consistent  with Grade II diastolic dysfunction (pseudonormalization).  2. Right ventricular systolic function is normal. The right ventricular size is normal.  3. Left atrial size was mildly dilated.  4. The mitral valve is degenerative. Trivial mitral valve regurgitation. No evidence of mitral stenosis.  5. The aortic valve is normal in structure. Aortic valve regurgitation is not visualized. No aortic stenosis is present.  6. The  inferior vena cava is normal in size with greater than 50% respiratory variability, suggesting right atrial pressure of 3 mmHg. FINDINGS  Left Ventricle: Left ventricular ejection fraction, by estimation, is 55 to 60%. The left ventricle has normal function. The left ventricle has no regional wall motion abnormalities. Definity  contrast agent was given IV to delineate the left ventricular  endocardial borders. The left ventricular internal cavity size was normal in size. There is mild concentric left ventricular hypertrophy. Left ventricular diastolic parameters are consistent with Grade II diastolic dysfunction (pseudonormalization). Right Ventricle: The right ventricular size is normal. No increase in right ventricular wall thickness. Right ventricular systolic function is normal. Left Atrium: Left atrial size was mildly dilated. Right Atrium: Right atrial size was normal in size. Prominent Chiari network. Pericardium: There is no evidence of pericardial effusion. Mitral Valve: The mitral valve is degenerative in appearance. Mild mitral annular calcification. Trivial mitral valve regurgitation. No evidence of mitral valve stenosis. MV peak gradient, 6.2 mmHg. The mean mitral valve gradient is 2.0 mmHg. Tricuspid Valve: The tricuspid valve is normal in structure. Tricuspid valve regurgitation is not demonstrated. No evidence of tricuspid stenosis. Aortic Valve: The aortic valve is normal in structure. Aortic valve regurgitation is not visualized. No aortic stenosis is present. Aortic valve mean gradient measures 3.5 mmHg. Aortic valve peak gradient measures 6.4 mmHg. Aortic valve area, by VTI measures 2.26 cm. Pulmonic Valve: The pulmonic valve was normal in structure. Pulmonic valve regurgitation is not visualized. No evidence of pulmonic stenosis. Aorta: The aortic root is normal in size and structure. Venous: The inferior vena cava is normal in size with greater than 50% respiratory variability, suggesting  right atrial pressure of 3 mmHg. IAS/Shunts: No atrial level shunt detected by color flow Doppler.  LEFT VENTRICLE PLAX 2D LVIDd:         5.40 cm   Diastology LVIDs:         3.90 cm   LV e' medial:    6.64 cm/s LV PW:         1.10 cm   LV E/e' medial:  18.2 LV IVS:        1.30 cm   LV e' lateral:   7.80 cm/s LVOT diam:     1.90 cm   LV E/e' lateral: 15.5 LV SV:         62 LV SV Index:   29 LVOT Area:     2.84 cm  RIGHT VENTRICLE             IVC RV Basal diam:  3.70 cm     IVC diam: 2.00 cm RV Mid diam:    3.30 cm RV S prime:     11.60 cm/s TAPSE (M-mode): 2.0 cm LEFT ATRIUM             Index        RIGHT ATRIUM           Index LA diam:        4.20 cm 1.99 cm/m   RA Area:     18.80 cm LA Vol (A2C):  86.0 ml 40.77 ml/m  RA Volume:   55.50 ml  26.31 ml/m LA Vol (A4C):   49.0 ml 23.23 ml/m LA Biplane Vol: 66.2 ml 31.38 ml/m  AORTIC VALVE AV Area (Vmax):    2.28 cm AV Area (Vmean):   2.27 cm AV Area (VTI):     2.26 cm AV Vmax:           126.00 cm/s AV Vmean:          85.950 cm/s AV VTI:            0.272 m AV Peak Grad:      6.4 mmHg AV Mean Grad:      3.5 mmHg LVOT Vmax:         101.50 cm/s LVOT Vmean:        68.900 cm/s LVOT VTI:          0.217 m LVOT/AV VTI ratio: 0.80  AORTA Ao Root diam: 3.20 cm MITRAL VALVE MV Area (PHT): 2.80 cm     SHUNTS MV Area VTI:   1.70 cm     Systemic VTI:  0.22 m MV Peak grad:  6.2 mmHg     Systemic Diam: 1.90 cm MV Mean grad:  2.0 mmHg MV Vmax:       1.24 m/s MV Vmean:      70.2 cm/s MV Decel Time: 271 msec MR Peak grad: 7.4 mmHg MR Vmax:      136.00 cm/s MV E velocity: 121.00 cm/s MV A velocity: 105.00 cm/s MV E/A ratio:  1.15 Morene Brownie Electronically signed by Morene Brownie Signature Date/Time: 04/08/2024/2:02:49 PM    Final     Cardiac Studies Echo: IMPRESSIONS     1. Left ventricular ejection fraction, by estimation, is 55 to 60%. The  left ventricle has normal function. The left ventricle has no regional  wall motion abnormalities. There is mild concentric  left ventricular  hypertrophy. Left ventricular diastolic  parameters are consistent with Grade II diastolic dysfunction  (pseudonormalization).   2. Right ventricular systolic function is normal. The right ventricular  size is normal.   3. Left atrial size was mildly dilated.   4. The mitral valve is degenerative. Trivial mitral valve regurgitation.  No evidence of mitral stenosis.   5. The aortic valve is normal in structure. Aortic valve regurgitation is  not visualized. No aortic stenosis is present.   6. The inferior vena cava is normal in size with greater than 50%  respiratory variability, suggesting right atrial pressure of 3 mmHg.   Patient Profile   79 y.o. male with a hx of CAD status post PCI, hyperlipidemia, tobacco abuse, COPD, GERD and prior paroxysmal atrial fibrillation chronically anticoagulated with Eliquis  who is being seen 04/06/2024 for the evaluation of A-fib RVR and the setting of multifocal pneumonia at the request of Dr. Georgina.   Assessment & Plan  Atrial fibrillation with RVR in setting of CAP. Had converted to NSR but had recurrent Afib through most of the day yesterday with good rate control.  Continue Eliquis . Echo looked good with normal LV and valve function. On Cardizem  CD 240 mg daily. AAD therapy limited. Not a candidate for IC agent due to CAD. I am concerned about amiodarone  given underlying lung disease/COPD. Will try Multaq  400 mg bid with food.  Acute hypoxic respiratory failure. CXR is clearly worse. This could be pulmonary edema vs worsening PNA. Agree with IV lasix . Will order 40 mg IV bid. Monitor strict weight and I/O.  If this is pulmonary edema I would anticipate more rapid clearing. Will recheck CXR in am.  CAD s/p remote PCI to LCx in 2017. No active angina.  HLD on simvastatin  20 mg  HTN  well controlled.  5.   CAP Per hospitalist team    For questions or updates, please contact [facility] Please consult [email or web address] for contact  info under       Signed, Sanford Lindblad, MD  04/10/2024, 10:19 AM    "

## 2024-04-10 NOTE — Care Management Important Message (Signed)
 Important Message  Patient Details  Name: Troy Jackson MRN: [id number] Date of Birth: 1945-05-26   Important Message Given:     IM given on 04/09/2024   Claretta Deed 04/10/2024, 8:10 AM

## 2024-04-11 ENCOUNTER — Inpatient Hospital Stay (HOSPITAL_COMMUNITY)

## 2024-04-11 DIAGNOSIS — J188 Other pneumonia, unspecified organism: Secondary | ICD-10-CM | POA: Diagnosis not present

## 2024-04-11 DIAGNOSIS — I480 Paroxysmal atrial fibrillation: Secondary | ICD-10-CM | POA: Diagnosis not present

## 2024-04-11 DIAGNOSIS — E43 Unspecified severe protein-calorie malnutrition: Secondary | ICD-10-CM | POA: Diagnosis present

## 2024-04-11 LAB — CULTURE, BLOOD (ROUTINE X 2)
Culture: NO GROWTH
Culture: NO GROWTH

## 2024-04-11 LAB — BASIC METABOLIC PANEL WITH GFR
Anion gap: 8 (ref 5–15)
BUN: 30 mg/dL — ABNORMAL HIGH (ref 8–23)
CO2: 31 mmol/L (ref 22–32)
Calcium: 8.8 mg/dL — ABNORMAL LOW (ref 8.9–10.3)
Chloride: 97 mmol/L — ABNORMAL LOW (ref 98–111)
Creatinine, Ser: 0.86 mg/dL (ref 0.61–1.24)
GFR, Estimated: >60 mL/min
Glucose, Bld: 149 mg/dL — ABNORMAL HIGH (ref 70–99)
Potassium: 4.3 mmol/L (ref 3.5–5.1)
Sodium: 136 mmol/L (ref 135–145)

## 2024-04-11 LAB — CBC
HCT: 36.3 % — ABNORMAL LOW (ref 39.0–52.0)
Hemoglobin: 12.2 g/dL — ABNORMAL LOW (ref 13.0–17.0)
MCH: 31.2 pg (ref 26.0–34.0)
MCHC: 33.6 g/dL (ref 30.0–36.0)
MCV: 92.8 fL (ref 80.0–100.0)
Platelets: 397 10*3/uL (ref 150–400)
RBC: 3.91 MIL/uL — ABNORMAL LOW (ref 4.22–5.81)
RDW: 13.1 % (ref 11.5–15.5)
WBC: 13.4 10*3/uL — ABNORMAL HIGH (ref 4.0–10.5)
nRBC: 0 % (ref 0.0–0.2)

## 2024-04-11 NOTE — Progress Notes (Signed)
 " PROGRESS NOTE Troy Jackson  FMW:[id number] DOB: 12/04/1945 DOA: 04/06/2024 PCP: Shayne Anes, MD  Brief Narrative/Hospital Course: Troy Jackson is a 79 y.o. male with PMH of CAD s/p PCI, Afib, HTN, HLD, and COPD presented to the ED with shortness of breath and dyspnea on exertion since last Tuesday along with chills fever loss of appetite, hypoxic and wheezing and gasping for air  In the ED patient tachycardic tachypneic,w/ hypoxia (required BiPAP initially and transitioned to HFNC 6L/min; on RA PTA). Labs notable for pro-BNP 4494. CXR showed focal consolidations in LLL and RML. EKG showed AFRVR. Patient was admitted for multifocal pneumonia along with A-fib with RVR and HFpEF exacerbation. Patient needed BiPAP initially in the ED and was admitted for further management Patient continued on IV antibiotics, seen by cardiology for A-fib with RVR 1/28:  needing nonrebreather and HHFNC seen by pulmonary as well as cardiology  Subjective: Seen and examined Patient still short of breath, but not struggling to breathe alert awake resting comfortably Called and updated wife on the phone, later on daughter also walked in Overnight afebrile BP stable on HHFNC at 40 L/min FiO2 decreased from 90% to 60% Labs with mild leukocytosis anemia stable electrolytes  Assessment and plan:  Multifocal pneumonia Acute hypoxic respiratory failure w/ respiratory distress: Presenting with shortness of breath hypoxia chest x-ray with multifocal consolidation - although no fever and no WBC count. Initially needed BiPAP along with IV Lasix  x 2 and admitted on personal antibiotics. Has been on HFNC since then. Blood culture NGTD,MRSA PCR negative, no sputum to culture, Legionella/strep urine antigen pending. Repeat chest x-ray shows this morning needing nonrebreather-stat chest x-ray w/ worsened b/l and airspace opacities-concerning for worsening CHF, ? If worsening pneumonia- but no fever. ABG-no CO2 retention pO2  53 pH 7.4> changed to HHFNC-FiO2 90%, given IV Lasix  FiO2 down to 60% 1/19 am 1/29- repeat chest x-ray shows bilateral airspace parities right greater than left superimposed on chronic interstitial lung disease. Patient had been on antibiotics, ceftriaxone  azithromycin , changed to Zosyn , vancomycin  1/28, Continue IV steroid, pulmonary toileting IS, FV, prn nebs, Brovana  Pulmicort  Yupleri  No suspicion of PE, his echo was unchanged with preserved EF-and on Eliquis  already  COPD Emphysemia: On trelegy at home no longer on symbicort. Cont Brovana , yupleri and Pulmicort  and IV steroid in the setting of pneumonia and worsening hypoxia   Acute on chronic HFpEF: S/p 2 doses of IV Lasix  In ED. chest x-ray with interstitial opacities worsening suspect CHF exacerbation Continue IV Lasix  40 Ketalar, appreciate cardiology input.  Monitor intake output Daily weight. Net IO Since Admission: -1,166.7 mL [04/11/24 1119]   Persistent A-fib with RVR Hypotension, iatrogenic: History of A-fib PTA on Eliquis , Cardizem , PRN metoprolol . On admit RVR likely due to underlying infection  Patient converted to NSR- Now on cardizem , Eliquis  and Multaq .Echo 1/26 shows EF 55-60%, G2 DD.  CAD with history of NSTEMI s/p PCI to LCx in 01/2016 Hyperlipidemia Hypertension: BP controlled on Cardizem .Troponin minimally elevated from demand ischemia in the setting of hypoxia. Continue Eliquis , statin. hold rest of BP meds.   Hypokalemia: Resolved  Chronic anxiety on Xanax : He takes Xanax  bid, cont same  Mild anemia: Hemoglobin stable.  Monitor  Deconditioning/debility Failure to thrive Mobility: Continue PT OT, RD eval augment diet. PT Orders: Active PT Follow up Rec: Home Health Pt1/28/2026 1200    DVT prophylaxis: Eliquis  Code Status:   Code Status: Full Code Family Communication: plan of care discussed with patient at bedside.  Again updated patient's wife as well as daughter extensively All questions were  answered  Patient status is: Remains hospitalized because of severity of illness Level of care: Progressive   Dispo: The patient is from: home            Anticipated disposition: TBD Objective: Vitals last 24 hrs: Vitals:   04/11/24 0451 04/11/24 0838 04/11/24 0850 04/11/24 0854  BP: 121/60 113/64    Pulse: 70 69 71 72  Resp: 18 16 15 16   Temp: 97.7 F (36.5 C) 97.7 F (36.5 C)    TempSrc: Oral Oral    SpO2: 93% 90% 91% 94%  Weight: 84.7 kg     Height:       Physical Examination: General exam: AAOX3, not in acute distress, mildly short of breath able to speak in full sentence HEENT:Oral mucosa moist, Ear/Nose WNL grossly Respiratory system: Bilaterally distant breath sound, no use of accessory muscles  Cardiovascular system: S1 & S2 +, No JVD. Gastrointestinal system: Abdomen soft,NT,ND, BS+ Nervous System: Alert, awake, Non focal Extremities: extremities warm, leg edema neg Skin: Warm, no rashes MSK: Normal muscle bulk,tone, power   Medications reviewed:  Scheduled Meds:  ALPRAZolam   0.5 mg Oral Q0600   ALPRAZolam   1 mg Oral QHS   apixaban   5 mg Oral BID   arformoterol   15 mcg Nebulization BID   budesonide  (PULMICORT ) nebulizer solution  0.25 mg Nebulization BID   cholecalciferol   2,000 Units Oral Daily   diltiazem   240 mg Oral Daily   dronedarone   400 mg Oral BID WC   ezetimibe   10 mg Oral QPM   furosemide   40 mg Intravenous Q12H   lactose free nutrition  237 mL Oral BID BM   methylPREDNISolone  (SOLU-MEDROL ) injection  40 mg Intravenous Daily   nystatin   5 mL Oral QID   pantoprazole   40 mg Oral Daily   revefenacin   175 mcg Nebulization Daily   simvastatin   20 mg Oral Daily  Continuous Infusions:  piperacillin -tazobactam (ZOSYN )  IV 3.375 g (04/11/24 0451)   vancomycin  1,000 mg (04/11/24 1108)   Diet Order             Diet renal/carb modified with fluid restriction Diet-HS Snack? Nothing; Fluid restriction: 1500 mL Fluid; Room service appropriate? Yes; Fluid  consistency: Thin  Diet effective now                    Unresulted Labs (From admission, onward)     Start     Ordered   04/11/24 0500  Basic metabolic panel  Daily,   R     Question:  Specimen collection method  Answer:  Lab=Lab collect   04/10/24 1027   04/08/24 0748  Strep pneumoniae urinary antigen  Once,   R        04/08/24 0749           Data Reviewed: I have personally reviewed following labs and imaging studies ( see epic result tab) CBC: Recent Labs  Lab 04/06/24 0848 04/06/24 1058 04/07/24 0205 04/10/24 0957 04/11/24 0303  WBC 9.6 9.4 8.7 10.1 13.4*  NEUTROABS 8.3*  --   --   --   --   HGB 12.2* 11.9* 11.7* 13.1 12.2*  HCT 36.4* 35.4* 34.6* 39.1 36.3*  MCV 93.8 93.7 92.5 92.7 92.8  PLT 203 207 214 366 397   CMP: Recent Labs  Lab 04/07/24 0205 04/08/24 0018 04/09/24 0237 04/10/24 0957 04/11/24 0303  NA 136 135 139  137 136  K 3.8 3.3* 3.8 4.2 4.3  CL 100 97* 100 97* 97*  CO2 27 28 30 31 31   GLUCOSE 116* 105* 110* 206* 149*  BUN 22 28* 25* 24* 30*  CREATININE 1.16 0.96 0.89 0.85 0.86  CALCIUM 8.3* 8.3* 8.4* 9.1 8.8*  MG  --  2.0  --   --   --    GFR: Estimated Creatinine Clearance: 80.5 mL/min (by C-G formula based on SCr of 0.86 mg/dL). Recent Labs  Lab 04/06/24 0848  AST 20  ALT 14  ALKPHOS 54  BILITOT 0.9  PROT 6.4*  ALBUMIN 3.1*   No results for input(s): LIPASE, AMYLASE in the last 168 hours. No results for input(s): AMMONIA in the last 168 hours. Coagulation Profile:  Recent Labs  Lab 04/06/24 0848  INR 1.2   Antimicrobials/Microbiology: Anti-infectives (From admission, onward)    Start     Dose/Rate Route Frequency Ordered Stop   04/11/24 0900  cefTRIAXone  (ROCEPHIN ) 1 g in sodium chloride  0.9 % 100 mL IVPB  Status:  Discontinued        1 g 200 mL/hr over 30 Minutes Intravenous Every 24 hours 04/10/24 0848 04/10/24 1140   04/10/24 2300  vancomycin  (VANCOCIN ) IVPB 1000 mg/200 mL premix        1,000 mg 200 mL/hr over  60 Minutes Intravenous Every 12 hours 04/10/24 1237     04/10/24 1330  vancomycin  (VANCOREADY) IVPB 1750 mg/350 mL        1,750 mg 175 mL/hr over 120 Minutes Intravenous  Once 04/10/24 1232 04/10/24 1840   04/10/24 1245  piperacillin -tazobactam (ZOSYN ) IVPB 3.375 g        3.375 g 12.5 mL/hr over 240 Minutes Intravenous Every 8 hours 04/10/24 1232     04/10/24 1230  azithromycin  (ZITHROMAX ) tablet 500 mg  Status:  Discontinued        500 mg Oral Daily 04/10/24 1140 04/10/24 1230   04/07/24 1000  cefTRIAXone  (ROCEPHIN ) 1 g in sodium chloride  0.9 % 100 mL IVPB  Status:  Discontinued        1 g 200 mL/hr over 30 Minutes Intravenous Every 24 hours 04/06/24 1046 04/10/24 1417   04/06/24 1100  azithromycin  (ZITHROMAX ) tablet 500 mg        500 mg Oral Daily 04/06/24 1046 04/08/24 0840   04/06/24 1015  cefTRIAXone  (ROCEPHIN ) 1 g in sodium chloride  0.9 % 100 mL IVPB        1 g 200 mL/hr over 30 Minutes Intravenous  Once 04/06/24 1001 04/06/24 1038   04/06/24 1015  doxycycline  (VIBRAMYCIN ) 100 mg in sodium chloride  0.9 % 250 mL IVPB  Status:  Discontinued        100 mg 125 mL/hr over 120 Minutes Intravenous Every 12 hours 04/06/24 1001 04/06/24 1046         Component Value Date/Time   SDES EXPECTORATED SPUTUM 04/08/2024 1741   SPECREQUEST NONE 04/08/2024 1741   CULT  04/06/2024 0853    NO GROWTH 5 DAYS Performed at [facility], [address] [place], [place] [address]    REPTSTATUS 04/10/2024 FINAL 04/08/2024 1741    Procedures:    Mennie LAMY, MD [facility] 04/11/2024, 11:19 AM   "

## 2024-04-11 NOTE — Progress Notes (Signed)
 Occupational Therapy Treatment Patient Details Name: Troy Jackson MRN: [id number] DOB: [birth year] Today's Date: 04/11/2024   History of present illness 79 y.o. male who presented to ED 04/06/24 with  SOB/DOE and found to have multifocal CAP c/b AFRVR and HFpEF exacerbation. PMH:CAD s/p PCI, Afib, HTN, HLD, and COPD   OT comments  Patient received in supine and eager to mobilize. Patient on 40 liters HHFNC 70% with SpO2 90% in supine. Patient moved to EOB with SpO2 dropping into 80's with activity and is able to maintain 88% with PLB. Patient eager to walk and performed mobility in room short distance with O2 dropping to 76% and patient returned to EOB and then supine with SpO2 88-90% with PLB.  Discharge recommendations continue to be appropriate for home. Acute OT to continue to follow to address established goals.       If plan is discharge home, recommend the following:  Other (comment) (PRN)   Equipment Recommendations  None recommended by OT    Recommendations for Other Services      Precautions / Restrictions Precautions Precautions: Fall;Other (comment) Recall of Precautions/Restrictions: Intact Precaution/Restrictions Comments: watch O2 (does not wear at baseline) HHFNC Restrictions Weight Bearing Restrictions Per Provider Order: No       Mobility Bed Mobility Overal bed mobility: Needs Assistance Bed Mobility: Supine to Sit, Sit to Supine     Supine to sit: Min assist, HOB elevated, Used rails Sit to supine: Min assist   General bed mobility comments: increased time to get to EOB and assistance with BLEs    Transfers Overall transfer level: Needs assistance Equipment used: Rolling walker (2 wheels) Transfers: Sit to/from Stand Sit to Stand: Contact guard assist, From elevated surface           General transfer comment: limited mobility due to desating     Balance Overall balance assessment: Needs assistance Sitting-balance support: No upper extremity  supported, Feet supported Sitting balance-Leahy Scale: Good Sitting balance - Comments: EOB   Standing balance support: Bilateral upper extremity supported, No upper extremity supported, During functional activity Standing balance-Leahy Scale: Fair Standing balance comment: can stand without UE support, BUE for dynamic standing                           ADL either performed or assessed with clinical judgement   ADL Overall ADL's : Needs assistance/impaired     Grooming: Wash/dry hands;Wash/dry face;Oral care;Set up;Sitting Grooming Details (indicate cue type and reason): performed in sitting due to patient desating when standing                               General ADL Comments: limited due to desating    Extremity/Trunk Assessment              Vision       Perception     Praxis     Communication Communication Communication: No apparent difficulties   Cognition Arousal: Alert Behavior During Therapy: WFL for tasks assessed/performed Cognition: No apparent impairments                               Following commands: Intact        Cueing   Cueing Techniques: Verbal cues  Exercises      Shoulder Instructions       General Comments Patient  desating with activity    Pertinent Vitals/ Pain       Pain Assessment Pain Assessment: No/denies pain  Home Living                                          Prior Functioning/Environment              Frequency  Min 2X/week        Progress Toward Goals  OT Goals(current goals can now be found in the care plan section)  Progress towards OT goals: Progressing toward goals  Acute Rehab OT Goals Patient Stated Goal: to walk more OT Goal Formulation: With patient Time For Goal Achievement: 04/22/24 Potential to Achieve Goals: Good ADL Goals Additional ADL Goal #1: Pt to verbalize at least 3 energy conservation strategies and safe O2 tubing mgmt  strategies to implement during ADLs/mobility Additional ADL Goal #2: Pt to increase standing activity tolerance > 10 min during ADLs/mobility without need for seated rest break  Plan      Co-evaluation                 AM-PAC OT 6 Clicks Daily Activity     Outcome Measure   Help from another person eating meals?: None Help from another person taking care of personal grooming?: A Little Help from another person toileting, which includes using toliet, bedpan, or urinal?: A Little Help from another person bathing (including washing, rinsing, drying)?: A Little Help from another person to put on and taking off regular upper body clothing?: A Little Help from another person to put on and taking off regular lower body clothing?: A Little 6 Click Score: 19    End of Session Equipment Utilized During Treatment: Gait belt;Rolling walker (2 wheels);Oxygen   OT Visit Diagnosis: Muscle weakness (generalized) (M62.81)   Activity Tolerance Patient tolerated treatment well   Patient Left in bed;with bed alarm set;with call bell/phone within reach   Nurse Communication Mobility status;Other (comment) (SpO2 when standing)        Time: 9195-9168 OT Time Calculation (min): 27 min  Charges: OT General Charges $OT Visit: 1 Visit OT Treatments $Self Care/Home Management : 8-22 mins $Therapeutic Activity: 8-22 mins  Dick Laine, OTA Acute Rehabilitation Services  Office [phone]   Jeb LITTIE Laine 04/11/2024, 9:24 AM

## 2024-04-11 NOTE — Care Management Important Message (Signed)
 Important Message  Patient Details  Name: Troy Jackson MRN: [id number] Date of Birth: 11-Jun-1945   Important Message Given:  Yes - Medicare IM     Claretta Deed 04/11/2024, 1:29 PM

## 2024-04-11 NOTE — Progress Notes (Signed)
 "  Rounding Note   Patient Name: Troy Jackson Date of Encounter: 04/11/2024  [facility] Cardiologist: [place] Festa, MD   Subjective Patient still SOB. On HF nasal cannula now. No chest pain  Scheduled Meds:  ALPRAZolam   0.5 mg Oral Q0600   ALPRAZolam   1 mg Oral QHS   apixaban   5 mg Oral BID   arformoterol   15 mcg Nebulization BID   budesonide  (PULMICORT ) nebulizer solution  0.25 mg Nebulization BID   cholecalciferol   2,000 Units Oral Daily   diltiazem   240 mg Oral Daily   dronedarone   400 mg Oral BID WC   ezetimibe   10 mg Oral QPM   furosemide   40 mg Intravenous Q12H   lactose free nutrition  237 mL Oral BID BM   methylPREDNISolone  (SOLU-MEDROL ) injection  40 mg Intravenous Daily   nystatin   5 mL Oral QID   pantoprazole   40 mg Oral Daily   revefenacin   175 mcg Nebulization Daily   simvastatin   20 mg Oral Daily   Continuous Infusions:  piperacillin -tazobactam (ZOSYN )  IV 3.375 g (04/11/24 0451)   vancomycin  Stopped (04/11/24 0135)   PRN Meds: acetaminophen , albuterol , senna-docusate, sodium chloride    Vital Signs  Vitals:   04/10/24 2329 04/11/24 0207 04/11/24 0429 04/11/24 0451  BP: (!) 117/57   121/60  Pulse: 68 67 65 70  Resp: (!) 21 (!) 21 (!) 21 18  Temp: 97.9 F (36.6 C)   97.7 F (36.5 C)  TempSrc: Oral   Oral  SpO2: 94% 97% 97% 93%  Weight:    84.7 kg  Height:        Intake/Output Summary (Last 24 hours) at 04/11/2024 0752 Last data filed at 04/11/2024 0459 Gross per 24 hour  Intake 792.26 ml  Output 1050 ml  Net -257.74 ml      04/11/2024    4:51 AM 04/06/2024    8:50 AM 11/14/2019   10:48 AM  Last 3 Weights  Weight (lbs) 186 lb 11.7 oz 190 lb 214 lb 3.2 oz  Weight (kg) 84.7 kg 86.183 kg 97.16 kg      Telemetry NSR. No Afib yesterday or today  ECG  none - Personally Reviewed  Physical Exam  GEN: No acute distress.   Neck: No JVD Cardiac: RRR, no murmurs, rubs, or gallops.  Respiratory: bilateral wheezes/rales/crackles.   GI: Soft, nontender, non-distended  MS: No edema; No deformity. Neuro:  Nonfocal  Psych: Normal affect   Labs High Sensitivity Troponin:  No results for input(s): TROPONINIHS in the last 720 hours.  Recent Labs  Lab 04/06/24 1058 04/10/24 0957 04/10/24 1145  TRNPT 53* 39* 40*       Chemistry Recent Labs  Lab 04/06/24 0848 04/06/24 1058 04/08/24 0018 04/09/24 0237 04/10/24 0957 04/11/24 0303  NA 134*   < > 135 139 137 136  K 4.0   < > 3.3* 3.8 4.2 4.3  CL 100   < > 97* 100 97* 97*  CO2 24   < > 28 30 31 31   GLUCOSE 165*   < > 105* 110* 206* 149*  BUN 17   < > 28* 25* 24* 30*  CREATININE 1.14   < > 0.96 0.89 0.85 0.86  CALCIUM 9.0   < > 8.3* 8.4* 9.1 8.8*  MG  --   --  2.0  --   --   --   PROT 6.4*  --   --   --   --   --  ALBUMIN 3.1*  --   --   --   --   --   AST 20  --   --   --   --   --   ALT 14  --   --   --   --   --   ALKPHOS 54  --   --   --   --   --   BILITOT 0.9  --   --   --   --   --   GFRNONAA >60   < > >60 >60 >60 >60  ANIONGAP 11   < > 10 9 9 8    < > = values in this interval not displayed.    Lipids No results for input(s): CHOL, TRIG, HDL, LABVLDL, LDLCALC, CHOLHDL in the last 168 hours.  Hematology Recent Labs  Lab 04/07/24 0205 04/10/24 0957 04/11/24 0303  WBC 8.7 10.1 13.4*  RBC 3.74* 4.22 3.91*  HGB 11.7* 13.1 12.2*  HCT 34.6* 39.1 36.3*  MCV 92.5 92.7 92.8  MCH 31.3 31.0 31.2  MCHC 33.8 33.5 33.6  RDW 13.5 13.0 13.1  PLT 214 366 397   Thyroid  No results for input(s): TSH, FREET4 in the last 168 hours.  BNP Recent Labs  Lab 04/06/24 0848 04/10/24 0957  PROBNP 4,494.0* 2,508.0*    DDimer No results for input(s): DDIMER in the last 168 hours.   Radiology  DG CHEST PORT 1 VIEW Result Date: 04/11/2024 EXAM: 1 VIEW(S) XRAY OF THE CHEST 04/11/2024 05:41:57 AM COMPARISON: 04/10/2024 CLINICAL HISTORY: Shortness of breath. FINDINGS: LUNGS AND PLEURA: Bilateral airspace opacities, right greater than left. Chronic  interstitial lung disease. No pleural effusion. No pneumothorax. HEART AND MEDIASTINUM: Aortic calcification is present. No acute abnormality of the cardiac and mediastinal silhouettes. BONES AND SOFT TISSUES: Spinal stimulator partially imaged. No acute osseous abnormality. IMPRESSION: 1. Bilateral airspace opacities, right greater than left, superimposed on chronic interstitial lung disease. 2. Aortic atherosclerosis. 3. Partially imaged spinal stimulator. Electronically signed by: Evalene Coho MD 04/11/2024 06:22 AM EST RP Workstation: [id number]   DG Chest Port 1 View Result Date: 04/10/2024 EXAM: 1 VIEW(S) XRAY OF THE CHEST 04/10/2024 07:55:00 AM COMPARISON: 04/06/2024 CLINICAL HISTORY: Shortness of breath. FINDINGS: LINES, TUBES AND DEVICES: Spinal Stimulator in place, tip overlying the midline. LUNGS AND PLEURA: Worsened bilateral interstitial and airspace opacities compared to prior. No pleural effusion. No pneumothorax. HEART AND MEDIASTINUM: No acute abnormality of the cardiac and mediastinal silhouettes. BONES AND SOFT TISSUES: No acute osseous abnormality. IMPRESSION: 1. Worsened bilateral interstitial and airspace opacities compared to prior. 2. Spinal stimulator in place with tip overlying the midline. Electronically signed by: Evalene Coho MD 04/10/2024 08:00 AM EST RP Workstation: [id number]    Cardiac Studies Echo: IMPRESSIONS     1. Left ventricular ejection fraction, by estimation, is 55 to 60%. The  left ventricle has normal function. The left ventricle has no regional  wall motion abnormalities. There is mild concentric left ventricular  hypertrophy. Left ventricular diastolic  parameters are consistent with Grade II diastolic dysfunction  (pseudonormalization).   2. Right ventricular systolic function is normal. The right ventricular  size is normal.   3. Left atrial size was mildly dilated.   4. The mitral valve is degenerative. Trivial mitral valve regurgitation.   No evidence of mitral stenosis.   5. The aortic valve is normal in structure. Aortic valve regurgitation is  not visualized. No aortic stenosis is present.   6. The inferior vena cava is  normal in size with greater than 50%  respiratory variability, suggesting right atrial pressure of 3 mmHg.   Patient Profile   79 y.o. male with a hx of CAD status post PCI, hyperlipidemia, tobacco abuse, COPD, GERD and prior paroxysmal atrial fibrillation chronically anticoagulated with Eliquis  who is being seen 04/06/2024 for the evaluation of A-fib RVR and the setting of multifocal pneumonia at the request of Dr. Georgina.   Assessment & Plan  Atrial fibrillation with RVR in setting of CAP. Had converted to NSR but had recurrent Afib. Continue Eliquis . Echo looked good with normal LV and valve function. On Cardizem  CD 240 mg daily. AAD therapy limited. Not a candidate for IC agent due to CAD. I am concerned about amiodarone  given underlying lung disease/COPD. Now on Multaq  with no recurrence so far Acute hypoxic respiratory failure. CXR with continued diffuse infiltrates despite fairly good diuresis yesterday. Increased oxygen  requirements. Suspect this is more PNA than CHF with normal Echo but would continue IV diuresis today. Fluid restriction. Starting steroids per primary team.  CAD s/p remote PCI to LCx in 2017. No active angina.  HLD on simvastatin  20 mg  HTN  well controlled.  5.   CAP Per hospitalist team    For questions or updates, please contact [facility] Please consult [email or web address] for contact info under       Signed, Faven Watterson, MD  04/11/2024, 7:52 AM    "

## 2024-04-12 DIAGNOSIS — R918 Other nonspecific abnormal finding of lung field: Secondary | ICD-10-CM | POA: Diagnosis not present

## 2024-04-12 DIAGNOSIS — J188 Other pneumonia, unspecified organism: Secondary | ICD-10-CM | POA: Diagnosis not present

## 2024-04-12 DIAGNOSIS — I480 Paroxysmal atrial fibrillation: Secondary | ICD-10-CM | POA: Diagnosis not present

## 2024-04-12 DIAGNOSIS — J9601 Acute respiratory failure with hypoxia: Secondary | ICD-10-CM | POA: Diagnosis not present

## 2024-04-12 LAB — EXPECTORATED SPUTUM ASSESSMENT W GRAM STAIN, RFLX TO RESP C

## 2024-04-12 LAB — BASIC METABOLIC PANEL WITH GFR
Anion gap: 11 (ref 5–15)
BUN: 34 mg/dL — ABNORMAL HIGH (ref 8–23)
CO2: 34 mmol/L — ABNORMAL HIGH (ref 22–32)
Calcium: 8.6 mg/dL — ABNORMAL LOW (ref 8.9–10.3)
Chloride: 92 mmol/L — ABNORMAL LOW (ref 98–111)
Creatinine, Ser: 0.97 mg/dL (ref 0.61–1.24)
GFR, Estimated: >60 mL/min
Glucose, Bld: 171 mg/dL — ABNORMAL HIGH (ref 70–99)
Potassium: 4 mmol/L (ref 3.5–5.1)
Sodium: 138 mmol/L (ref 135–145)

## 2024-04-12 MED ORDER — SODIUM CHLORIDE 3 % IN NEBU
4.0000 mL | INHALATION_SOLUTION | Freq: Two times a day (BID) | RESPIRATORY_TRACT | Status: DC
  Administered 2024-04-12 – 2024-04-15 (×4): 4 mL via RESPIRATORY_TRACT
  Filled 2024-04-12 (×7): qty 4

## 2024-04-12 NOTE — Progress Notes (Signed)
 Pt refuses to use Instinctive Spirometry- educated pt

## 2024-04-12 NOTE — Consult Note (Signed)
 "  NAME:  Troy Jackson, MRN:  [id number], DOB:  [birth year], LOS: 6 ADMISSION DATE:  04/06/2024, CONSULTATION DATE:  04/10/24 REFERRING MD:  TRH, CHIEF COMPLAINT:  SOB   History of Present Illness:  79 year old man w/ hx of longstanding smoking, COPD, Afib on AC, HFpEF p/w worsening SOB and chills that started maybe 2 weeks ago.  He minimizes symptoms so have to get history from wife.  +cough with whitish sputum.  + wheezing improved somewhat with home nebs (wife is former RT).  Wife finally convinced him to go to hospital.  Here working diagnosis of uncontrolled afib leading to decompensated HFpEF +/- CAP; O2 needs have progressed despite diuresis and conversion to sinus rhythm (HHFNC, sats low mid 90s) so PCCM consulted.  He states overall he feels better than admit.    States no obvious inciting new meds or exposures.  Not currently smoking.  Sats have been 80s at home past week but again he minimizes DOE symptoms.  Flu/covid neg.  Patient has lost good deal of weight recently.  He states this is on purpose as he is dieting.  Pertinent  Medical History   Past Medical History:  Diagnosis Date   Anxiety    COPD (chronic obstructive pulmonary disease) (HCC)    Coronary artery disease 2017   stent   Diverticulitis    Dyspnea    doe, with exertion   Dysrhythmia    atrial fibrillation   GERD (gastroesophageal reflux disease)    Hyperlipidemia    Hypertension    Pneumonia    2019   Tobacco abuse 2017     Significant Hospital Events: Including procedures, antibiotic start and stop dates in addition to other pertinent events   1/24 admitted 1/28 PCCM admitted, started on antibiotics - vancomycin /zosyn , started on steroids  Interim History / Subjective:   Patient is feeling better Remains on 15L [place] Wife and daughter at bedside, updated  Objective    Blood pressure (!) 120/54, pulse 70, temperature 97.8 F (36.6 C), temperature source Oral, resp. rate 14, height 6' 1.2 (1.859  m), weight 83.8 kg, SpO2 92%.        Intake/Output Summary (Last 24 hours) at 04/12/2024 1143 Last data filed at 04/12/2024 1040 Gross per 24 hour  Intake 1257 ml  Output 3225 ml  Net -1968 ml   Filed Weights   04/06/24 0850 04/11/24 0451 04/12/24 0500  Weight: 86.2 kg 84.7 kg 83.8 kg    Examination: General: no distress, sitting up in bed HENT: /AT, moist mucous membranes Lungs: clear to auscultation, no wheezing or crackles Cardiovascular: regular, no murmurs Abdomen: soft, non-tender, non-distended, +BS Extremities: no edema, warm Neuro: moving all extremities GU: n/a  Labs/imaging reviewed  CXR 1/29 1. Bilateral airspace opacities, right greater than left, superimposed on chronic interstitial lung disease. 2. Aortic atherosclerosis. 3. Partially imaged spinal stimulator.  Resolved problem list   Assessment and Plan  Acute Hypoxemic Respiratory failure w/ Bilateral opacities on CXR - differential includes pulmonary edema, HCAP vs inflammatory pneumonitis on top of baseline advanced emphysema.  Given O2 need level reasonable to treat all 3.  No s/s of aspiration. Continue course of Vanc/zosyn , pro cal elevated Check sputum culture, new order placed. Prior specimen not acceptable Diuresis as tolerated by BP and renal function ESR elevated, continue steroids  Triple neb therapy Add hypertonic nebs and continue flutter valve O2 for sats > 88%, seems comfortable on HFNC Family updated, will follow   Labs  CBC: Recent Labs  Lab 04/06/24 0848 04/06/24 1058 04/07/24 0205 04/10/24 0957 04/11/24 0303  WBC 9.6 9.4 8.7 10.1 13.4*  NEUTROABS 8.3*  --   --   --   --   HGB 12.2* 11.9* 11.7* 13.1 12.2*  HCT 36.4* 35.4* 34.6* 39.1 36.3*  MCV 93.8 93.7 92.5 92.7 92.8  PLT 203 207 214 366 397    Basic Metabolic Panel: Recent Labs  Lab 04/08/24 0018 04/09/24 0237 04/10/24 0957 04/11/24 0303 04/12/24 0216  NA 135 139 137 136 138  K 3.3* 3.8 4.2 4.3 4.0  CL  97* 100 97* 97* 92*  CO2 28 30 31 31  34*  GLUCOSE 105* 110* 206* 149* 171*  BUN 28* 25* 24* 30* 34*  CREATININE 0.96 0.89 0.85 0.86 0.97  CALCIUM 8.3* 8.4* 9.1 8.8* 8.6*  MG 2.0  --   --   --   --    GFR: Estimated Creatinine Clearance: 71.4 mL/min (by C-G formula based on SCr of 0.97 mg/dL). Recent Labs  Lab 04/06/24 0912 04/06/24 1057 04/06/24 1058 04/07/24 0205 04/10/24 0957 04/10/24 1145 04/11/24 0303  PROCALCITON  --   --   --   --   --  0.51  --   WBC  --   --  9.4 8.7 10.1  --  13.4*  LATICACIDVEN 1.8 1.6  --   --   --   --   --     Liver Function Tests: Recent Labs  Lab 04/06/24 0848  AST 20  ALT 14  ALKPHOS 54  BILITOT 0.9  PROT 6.4*  ALBUMIN 3.1*   No results for input(s): LIPASE, AMYLASE in the last 168 hours. No results for input(s): AMMONIA in the last 168 hours.  ABG    Component Value Date/Time   PHART 7.48 (H) 04/10/2024 1000   PCO2ART 42 04/10/2024 1000   PO2ART 53 (L) 04/10/2024 1000   HCO3 31.3 (H) 04/10/2024 1000   TCO2 27 07/31/2017 2317   O2SAT 88.4 04/10/2024 1000     Coagulation Profile: Recent Labs  Lab 04/06/24 0848  INR 1.2    Cardiac Enzymes: No results for input(s): CKTOTAL, CKMB, CKMBINDEX, TROPONINI in the last 168 hours.  HbA1C: No results found for: HGBA1C  CBG: No results for input(s): GLUCAP in the last 168 hours.     Critical care time: N/A    Dorn Chill, MD [facility] Office: [phone]   See Amion for personal pager PCCM on call pager [phone] until 7pm. Please call Elink 7p-7a. [phone]        "

## 2024-04-12 NOTE — Progress Notes (Signed)
 "  Rounding Note   Patient Name: Troy Jackson Date of Encounter: 04/12/2024  [facility] Cardiologist: Onofre Gains, MD   Subjective Patient reports breathing is better today. Still has high oxygen  requirement.   Scheduled Meds:  ALPRAZolam   0.5 mg Oral Q0600   ALPRAZolam   1 mg Oral QHS   apixaban   5 mg Oral BID   arformoterol   15 mcg Nebulization BID   budesonide  (PULMICORT ) nebulizer solution  0.25 mg Nebulization BID   cholecalciferol   2,000 Units Oral Daily   diltiazem   240 mg Oral Daily   dronedarone   400 mg Oral BID WC   ezetimibe   10 mg Oral QPM   furosemide   40 mg Intravenous Q12H   lactose free nutrition  237 mL Oral BID BM   methylPREDNISolone  (SOLU-MEDROL ) injection  40 mg Intravenous Daily   nystatin   5 mL Oral QID   pantoprazole   40 mg Oral Daily   revefenacin   175 mcg Nebulization Daily   simvastatin   20 mg Oral Daily   Continuous Infusions:  piperacillin -tazobactam (ZOSYN )  IV 3.375 g (04/12/24 0539)   vancomycin  1,000 mg (04/12/24 0033)   PRN Meds: acetaminophen , albuterol , senna-docusate, sodium chloride    Vital Signs  Vitals:   04/11/24 2300 04/12/24 0305 04/12/24 0500 04/12/24 0752  BP: (!) 118/57 110/61    Pulse: 67 62 62 76  Resp: 13 20 (!) 21 20  Temp: 97.6 F (36.4 C) 97.7 F (36.5 C)    TempSrc: Oral Oral    SpO2: 91% 94% 95%   Weight:   83.8 kg   Height:        Intake/Output Summary (Last 24 hours) at 04/12/2024 0810 Last data filed at 04/12/2024 0626 Gross per 24 hour  Intake 874.26 ml  Output 3100 ml  Net -2225.74 ml      04/12/2024    5:00 AM 04/11/2024    4:51 AM 04/06/2024    8:50 AM  Last 3 Weights  Weight (lbs) 184 lb 12.8 oz 186 lb 11.7 oz 190 lb  Weight (kg) 83.825 kg 84.7 kg 86.183 kg      Telemetry NSR. No recurrent Afib. Some PACs  ECG  none - Personally Reviewed  Physical Exam  GEN: No acute distress.   Neck: No JVD Cardiac: RRR, no murmurs, rubs, or gallops.  Respiratory: few bilateral  crackles but significantly better than yesterday GI: Soft, nontender, non-distended  MS: No edema; No deformity. Neuro:  Nonfocal  Psych: Normal affect   Labs High Sensitivity Troponin:  No results for input(s): TROPONINIHS in the last 720 hours.  Recent Labs  Lab 04/06/24 1058 04/10/24 0957 04/10/24 1145  TRNPT 53* 39* 40*       Chemistry Recent Labs  Lab 04/06/24 0848 04/06/24 1058 04/08/24 0018 04/09/24 0237 04/10/24 0957 04/11/24 0303 04/12/24 0216  NA 134*   < > 135   < > 137 136 138  K 4.0   < > 3.3*   < > 4.2 4.3 4.0  CL 100   < > 97*   < > 97* 97* 92*  CO2 24   < > 28   < > 31 31 34*  GLUCOSE 165*   < > 105*   < > 206* 149* 171*  BUN 17   < > 28*   < > 24* 30* 34*  CREATININE 1.14   < > 0.96   < > 0.85 0.86 0.97  CALCIUM 9.0   < > 8.3*   < >  9.1 8.8* 8.6*  MG  --   --  2.0  --   --   --   --   PROT 6.4*  --   --   --   --   --   --   ALBUMIN 3.1*  --   --   --   --   --   --   AST 20  --   --   --   --   --   --   ALT 14  --   --   --   --   --   --   ALKPHOS 54  --   --   --   --   --   --   BILITOT 0.9  --   --   --   --   --   --   GFRNONAA >60   < > >60   < > >60 >60 >60  ANIONGAP 11   < > 10   < > 9 8 11    < > = values in this interval not displayed.    Lipids No results for input(s): CHOL, TRIG, HDL, LABVLDL, LDLCALC, CHOLHDL in the last 168 hours.  Hematology Recent Labs  Lab 04/07/24 0205 04/10/24 0957 04/11/24 0303  WBC 8.7 10.1 13.4*  RBC 3.74* 4.22 3.91*  HGB 11.7* 13.1 12.2*  HCT 34.6* 39.1 36.3*  MCV 92.5 92.7 92.8  MCH 31.3 31.0 31.2  MCHC 33.8 33.5 33.6  RDW 13.5 13.0 13.1  PLT 214 366 397   Thyroid  No results for input(s): TSH, FREET4 in the last 168 hours.  BNP Recent Labs  Lab 04/06/24 0848 04/10/24 0957  PROBNP 4,494.0* 2,508.0*    DDimer No results for input(s): DDIMER in the last 168 hours.   Radiology  DG CHEST PORT 1 VIEW Result Date: 04/11/2024 EXAM: 1 VIEW(S) XRAY OF THE CHEST 04/11/2024  05:41:57 AM COMPARISON: 04/10/2024 CLINICAL HISTORY: Shortness of breath. FINDINGS: LUNGS AND PLEURA: Bilateral airspace opacities, right greater than left. Chronic interstitial lung disease. No pleural effusion. No pneumothorax. HEART AND MEDIASTINUM: Aortic calcification is present. No acute abnormality of the cardiac and mediastinal silhouettes. BONES AND SOFT TISSUES: Spinal stimulator partially imaged. No acute osseous abnormality. IMPRESSION: 1. Bilateral airspace opacities, right greater than left, superimposed on chronic interstitial lung disease. 2. Aortic atherosclerosis. 3. Partially imaged spinal stimulator. Electronically signed by: Evalene Coho MD 04/11/2024 06:22 AM EST RP Workstation: [id number]    Cardiac Studies Echo: IMPRESSIONS     1. Left ventricular ejection fraction, by estimation, is 55 to 60%. The  left ventricle has normal function. The left ventricle has no regional  wall motion abnormalities. There is mild concentric left ventricular  hypertrophy. Left ventricular diastolic  parameters are consistent with Grade II diastolic dysfunction  (pseudonormalization).   2. Right ventricular systolic function is normal. The right ventricular  size is normal.   3. Left atrial size was mildly dilated.   4. The mitral valve is degenerative. Trivial mitral valve regurgitation.  No evidence of mitral stenosis.   5. The aortic valve is normal in structure. Aortic valve regurgitation is  not visualized. No aortic stenosis is present.   6. The inferior vena cava is normal in size with greater than 50%  respiratory variability, suggesting right atrial pressure of 3 mmHg.   Patient Profile   79 y.o. male with a hx of CAD status post PCI, hyperlipidemia, tobacco abuse, COPD, GERD and prior paroxysmal atrial  fibrillation chronically anticoagulated with Eliquis  who is being seen 04/06/2024 for the evaluation of A-fib RVR and the setting of multifocal pneumonia at the request of Dr.  Georgina.   Assessment & Plan  Atrial fibrillation with RVR in setting of CAP. Had converted to NSR but had recurrent Afib. Continue Eliquis . Echo looked good with normal LV and valve function. On Cardizem  CD 240 mg daily. AAD therapy limited. Not a candidate for IC agent due to CAD. I am concerned about amiodarone  given underlying lung disease/COPD. Now on Multaq  with no recurrence so far Acute hypoxic respiratory failure. CXR with continued diffuse infiltrates. Increased oxygen  requirements. Suspect this is more PNA than CHF with normal Echo but would continue IV diuresis another day today. Good response with weight down 5 lbs and negative 2 liters yesterday. Fluid restriction. On steroids and antibiotics per primary team. Will repeat CXR in am. Possibly transition lasix  to po tomorrow.  CAD s/p remote PCI to LCx in 2017. No active angina.  HLD on simvastatin  20 mg  HTN  well controlled.  5.   CAP Per hospitalist team    For questions or updates, please contact [facility] Please consult [email or web address] for contact info under       Signed, Kien Mirsky, MD  04/12/2024, 8:10 AM    "

## 2024-04-12 NOTE — Progress Notes (Signed)
 " PROGRESS NOTE MARE LUDTKE  FMW:[id number] DOB: May 30, 1945 DOA: 04/06/2024 PCP: Shayne Anes, MD  Brief Narrative/Hospital Course: MARQUS MACPHEE is a 79 y.o. male with PMH of CAD s/p PCI, Afib, HTN, HLD, and COPD presented to the ED with shortness of breath and dyspnea on exertion since last Tuesday along with chills fever loss of appetite, hypoxic and wheezing and gasping for air  In the ED patient tachycardic tachypneic,w/ hypoxia (required BiPAP initially and transitioned to HFNC 6L/min; on RA PTA). Labs notable for pro-BNP 4494. CXR showed focal consolidations in LLL and RML. EKG showed AFRVR. Patient was admitted for multifocal pneumonia along with A-fib with RVR and HFpEF exacerbation. Patient needed BiPAP initially in the ED and was admitted for further management Patient continued on IV antibiotics, seen by cardiology for A-fib with RVR 1/28:  needing nonrebreather and HHFNC seen by pulmonary as well as cardiology  Subjective: Seen and examined He states he feels better this am, he is coughing up stuffs No chest pain , appears some short of breath Overnight afebrile BP stable patient is down to HFNC at 10 L no longer on HHFNC AT 40L. BMP with stable electrolytes.  Assessment and plan:  Multifocal pneumonia Acute hypoxic respiratory failure w/ respiratory distress: Presenting with shortness of breath hypoxia, CXR-multifocal consolidation  Initially needed BiPAP, LASIX  X 2  then switched to HFNC since, antibiotics continued to cover CAP. Blood culture NGTD,MRSA PCR negative, no sputum to culture, Legionella urien ag neg. Patient having worsening hypoxia on 1/28 am w/ needing NRB  CXR-w/ worsened b/l and airspace opacities-concerning for worsening CHF, or worsening pneumonia- but no fever. ABG-no CO2 retention pO2 53 pH 7.4> changed to HHFNC Seen by PCCM placed on aggressive IV Lasix , IV steroid continued changed to Zosyn , vancomycin  1/28 HHFNC 40 l/m  transitiond to HFNC 1/30  am, overall clinically improving. Cont current plan Continue pulmonary toileting IS, FV, prn nebs, Brovana  Pulmicort  Yupleri, diuretics No suspicion of PE, his echo was unchanged with preserved EF-and on Eliquis  already Check cxr In am  COPD Emphysemia: On trelegy at home here on nebs Brovana , yupleri and Pulmicort   and IV steroid in the setting of pneumonia and worsening hypoxia   Acute on chronic HFpEF: S/p 2 doses of IV Lasix  In ED. chest x-ray with interstitial opacities worsening suspect CHF exacerbation, also component of pneumonia Started back on IV Lasix  twice daily continue same with monitoring of renal function.  Appreciate cardiology input Weight on admission 190lb> now at 184.8lb Net IO Since Admission: -3,210.21 mL [04/12/24 1030]   Persistent A-fib with RVR Hypotension, iatrogenic: History of A-fib PTA on Eliquis , Cardizem , PRN metoprolol . On admission RVR likely due to underlying infection  Patient converted to NSR- now on cardizem , Eliquis  and Multaq .Echo 1/26 shows EF 55-60%, G2 DD.  Cardiology following.  CAD with history of NSTEMI s/p PCI to LCx in 01/2016 Hyperlipidemia Hypertension: BP controlled on Cardizem .Troponin minimally elevated from demand ischemia in the setting of hypoxia. Continue Eliquis , statin. hold rest of BP meds.   Hypokalemia: Resolved  Chronic anxiety on Xanax : He takes Xanax  bid, cont same  Mild anemia: Hemoglobin stable.  Monitor  Deconditioning/debility Failure to thrive Mobility: Continue PT OT, RD eval augment diet. PT Orders: Active PT Follow up Rec: Home Health Pt1/28/2026 1200    DVT prophylaxis: Eliquis  Code Status:   Code Status: Full Code Family Communication: plan of care discussed with patient at bedside.  Multiple times updated patient's wife and daughter-On daily basis.  Patient reports no need to call wife today  Wife is a retired RT. All questions were answered  Patient status is: Remains hospitalized because of  severity of illness Level of care: Progressive   Dispo: The patient is from: home            Anticipated disposition: TBD Objective: Vitals last 24 hrs: Vitals:   04/12/24 0305 04/12/24 0500 04/12/24 0752 04/12/24 0800  BP: 110/61     Pulse: 62 62 76 71  Resp: 20 (!) 21 20 20   Temp: 97.7 F (36.5 C)   97.7 F (36.5 C)  TempSrc: Oral     SpO2: 94% 95%  90%  Weight:  83.8 kg    Height:       Physical Examination: General exam: AAOX3, pleasant, not in distress, on HFNC HEENT:Oral mucosa moist, Ear/Nose WNL grossly Respiratory system: Bilaterally breath sounds diminished and no use of accessory muscles  Cardiovascular system: S1 & S2 +, No JVD. Gastrointestinal system: Abdomen soft,NT,ND, BS+ Nervous System: Alert, awake, Non focal Extremities: extremities warm, leg edema neg Skin: Warm, no rashes MSK: Normal muscle bulk,tone, power   Medications reviewed:  Scheduled Meds:  ALPRAZolam   0.5 mg Oral Q0600   ALPRAZolam   1 mg Oral QHS   apixaban   5 mg Oral BID   arformoterol   15 mcg Nebulization BID   budesonide  (PULMICORT ) nebulizer solution  0.25 mg Nebulization BID   cholecalciferol   2,000 Units Oral Daily   diltiazem   240 mg Oral Daily   dronedarone   400 mg Oral BID WC   ezetimibe   10 mg Oral QPM   furosemide   40 mg Intravenous Q12H   lactose free nutrition  237 mL Oral BID BM   methylPREDNISolone  (SOLU-MEDROL ) injection  40 mg Intravenous Daily   nystatin   5 mL Oral QID   pantoprazole   40 mg Oral Daily   revefenacin   175 mcg Nebulization Daily   simvastatin   20 mg Oral Daily  Continuous Infusions:  piperacillin -tazobactam (ZOSYN )  IV 12.5 mL/hr at 04/12/24 0900   vancomycin  1,000 mg (04/12/24 1019)   Diet Order             Diet renal/carb modified with fluid restriction Diet-HS Snack? Nothing; Fluid restriction: 1500 mL Fluid; Room service appropriate? Yes; Fluid consistency: Thin  Diet effective now                    Unresulted Labs (From admission,  onward)     Start     Ordered   04/13/24 0500  CBC  Tomorrow morning,   R       Question:  Specimen collection method  Answer:  Lab=Lab collect   04/12/24 0757   04/11/24 0500  Basic metabolic panel  Daily,   R     Question:  Specimen collection method  Answer:  Lab=Lab collect   04/10/24 1027   04/08/24 0748  Strep pneumoniae urinary antigen  Once,   R        04/08/24 0749           Data Reviewed: I have personally reviewed following labs and imaging studies ( see epic result tab) CBC: Recent Labs  Lab 04/06/24 0848 04/06/24 1058 04/07/24 0205 04/10/24 0957 04/11/24 0303  WBC 9.6 9.4 8.7 10.1 13.4*  NEUTROABS 8.3*  --   --   --   --   HGB 12.2* 11.9* 11.7* 13.1 12.2*  HCT 36.4* 35.4* 34.6* 39.1 36.3*  MCV 93.8 93.7  92.5 92.7 92.8  PLT 203 207 214 366 397   CMP: Recent Labs  Lab 04/08/24 0018 04/09/24 0237 04/10/24 0957 04/11/24 0303 04/12/24 0216  NA 135 139 137 136 138  K 3.3* 3.8 4.2 4.3 4.0  CL 97* 100 97* 97* 92*  CO2 28 30 31 31  34*  GLUCOSE 105* 110* 206* 149* 171*  BUN 28* 25* 24* 30* 34*  CREATININE 0.96 0.89 0.85 0.86 0.97  CALCIUM 8.3* 8.4* 9.1 8.8* 8.6*  MG 2.0  --   --   --   --    GFR: Estimated Creatinine Clearance: 71.4 mL/min (by C-G formula based on SCr of 0.97 mg/dL). Recent Labs  Lab 04/06/24 0848  AST 20  ALT 14  ALKPHOS 54  BILITOT 0.9  PROT 6.4*  ALBUMIN 3.1*   No results for input(s): LIPASE, AMYLASE in the last 168 hours. No results for input(s): AMMONIA in the last 168 hours. Coagulation Profile:  Recent Labs  Lab 04/06/24 0848  INR 1.2   Antimicrobials/Microbiology: Anti-infectives (From admission, onward)    Start     Dose/Rate Route Frequency Ordered Stop   04/11/24 0900  cefTRIAXone  (ROCEPHIN ) 1 g in sodium chloride  0.9 % 100 mL IVPB  Status:  Discontinued        1 g 200 mL/hr over 30 Minutes Intravenous Every 24 hours 04/10/24 0848 04/10/24 1140   04/10/24 2300  vancomycin  (VANCOCIN ) IVPB 1000 mg/200 mL  premix        1,000 mg 200 mL/hr over 60 Minutes Intravenous Every 12 hours 04/10/24 1237     04/10/24 1330  vancomycin  (VANCOREADY) IVPB 1750 mg/350 mL        1,750 mg 175 mL/hr over 120 Minutes Intravenous  Once 04/10/24 1232 04/10/24 1840   04/10/24 1245  piperacillin -tazobactam (ZOSYN ) IVPB 3.375 g        3.375 g 12.5 mL/hr over 240 Minutes Intravenous Every 8 hours 04/10/24 1232     04/10/24 1230  azithromycin  (ZITHROMAX ) tablet 500 mg  Status:  Discontinued        500 mg Oral Daily 04/10/24 1140 04/10/24 1230   04/07/24 1000  cefTRIAXone  (ROCEPHIN ) 1 g in sodium chloride  0.9 % 100 mL IVPB  Status:  Discontinued        1 g 200 mL/hr over 30 Minutes Intravenous Every 24 hours 04/06/24 1046 04/10/24 1417   04/06/24 1100  azithromycin  (ZITHROMAX ) tablet 500 mg        500 mg Oral Daily 04/06/24 1046 04/08/24 0840   04/06/24 1015  cefTRIAXone  (ROCEPHIN ) 1 g in sodium chloride  0.9 % 100 mL IVPB        1 g 200 mL/hr over 30 Minutes Intravenous  Once 04/06/24 1001 04/06/24 1038   04/06/24 1015  doxycycline  (VIBRAMYCIN ) 100 mg in sodium chloride  0.9 % 250 mL IVPB  Status:  Discontinued        100 mg 125 mL/hr over 120 Minutes Intravenous Every 12 hours 04/06/24 1001 04/06/24 1046         Component Value Date/Time   SDES EXPECTORATED SPUTUM 04/08/2024 1741   SPECREQUEST NONE 04/08/2024 1741   CULT  04/06/2024 0853    NO GROWTH 5 DAYS Performed at [facility], [address] [place], [place] [address]    REPTSTATUS 04/10/2024 FINAL 04/08/2024 1741    Procedures:    Mennie LAMY, MD [facility] 04/12/2024, 10:31 AM   "

## 2024-04-12 NOTE — Progress Notes (Signed)
 Physical Therapy Treatment Patient Details Name: Troy Jackson MRN: [id number] DOB: 02/10/1946 Today's Date: 04/12/2024   History of Present Illness 79 y.o. male who presented to ED 04/06/24 with  SOB/DOE and found to have multifocal CAP c/b AFRVR and HFpEF exacerbation. PMH:CAD s/p PCI, Afib, HTN, HLD, and COPD    PT Comments  Pt resting in bed on arrival, eager for mobility and demonstrating steady progress this session. Pt continues to be limited by decreased cardiopulmonary endurance, requiring standing rest breaks every ~25' during ambulation due to desaturation on 15L O2 into upper 70s/low 80s. With cues for purse lip breathing pt able to recover >90% in ~2 mins. Pt eager and motivated to progress mobilit as tolerated. Encouraged him to be up with staff over weekend as tolerated. Pt continues to benefit from skilled PT services to progress toward functional mobility goals.     If plan is discharge home, recommend the following: A little help with walking and/or transfers;A little help with bathing/dressing/bathroom;Assistance with cooking/housework;Help with stairs or ramp for entrance   Can travel by private vehicle        Equipment Recommendations  Rolling walker (2 wheels)    Recommendations for Other Services       Precautions / Restrictions Precautions Precautions: Fall;Other (comment) Recall of Precautions/Restrictions: Intact Precaution/Restrictions Comments: watch O2 (does not wear at baseline) [facility] Restrictions Weight Bearing Restrictions Per Provider Order: No     Mobility  Bed Mobility Overal bed mobility: Needs Assistance Bed Mobility: Supine to Sit, Sit to Supine     Supine to sit: Min assist, HOB elevated, Used rails Sit to supine: Min assist   General bed mobility comments: increased time to get to EOB and assistance with BLEs    Transfers Overall transfer level: Needs assistance Equipment used: Rolling walker (2 wheels) Transfers: Sit to/from  Stand Sit to Stand: Contact guard assist, From elevated surface           General transfer comment: cues for hand placement    Ambulation/Gait Ambulation/Gait assistance: Contact guard assist Gait Distance (Feet): 200 Feet (x5 standing rest breaks) Assistive device: Rolling walker (2 wheels) Gait Pattern/deviations: Step-through pattern, Trunk flexed, WFL(Within Functional Limits) Gait velocity: slowed     General Gait Details: CGA for safety, x5 standing rest breaks due to destaurtation on 15L to 70s-80s with cues for breathing techniques pt able to recover in ~2 mins. moved sensor to ear and pt with better pleth and SpO2 89-93% on 15L last ~75'   Stairs             Wheelchair Mobility     Tilt Bed    Modified Rankin (Stroke Patients Only)       Balance Overall balance assessment: Needs assistance Sitting-balance support: No upper extremity supported, Feet supported Sitting balance-Leahy Scale: Good Sitting balance - Comments: EOB   Standing balance support: Bilateral upper extremity supported, No upper extremity supported, During functional activity Standing balance-Leahy Scale: Fair Standing balance comment: can stand without UE support, BUE for dynamic standing                            Communication Communication Communication: No apparent difficulties  Cognition Arousal: Alert Behavior During Therapy: WFL for tasks assessed/performed                             Following commands: Intact  Cueing Cueing Techniques: Verbal cues  Exercises      General Comments General comments (skin integrity, edema, etc.): Pt contineus to desat with activity needing increased time and cues for pursed lip breathing to recover      Pertinent Vitals/Pain Pain Assessment Pain Assessment: No/denies pain    Home Living                          Prior Function            PT Goals (current goals can now be found in the  care plan section) Acute Rehab PT Goals PT Goal Formulation: With patient Time For Goal Achievement: 04/22/24 Progress towards PT goals: Progressing toward goals    Frequency    Min 2X/week      PT Plan      Co-evaluation              AM-PAC PT 6 Clicks Mobility   Outcome Measure  Help needed turning from your back to your side while in a flat bed without using bedrails?: None Help needed moving from lying on your back to sitting on the side of a flat bed without using bedrails?: A Little Help needed moving to and from a bed to a chair (including a wheelchair)?: A Little Help needed standing up from a chair using your arms (e.g., wheelchair or bedside chair)?: A Little Help needed to walk in hospital room?: A Little Help needed climbing 3-5 steps with a railing? : A Little 6 Click Score: 19    End of Session Equipment Utilized During Treatment: Gait belt;Oxygen  Activity Tolerance: Patient tolerated treatment well Patient left: with call bell/phone within reach;in chair;with family/visitor present;with nursing/sitter in room Nurse Communication: Mobility status;Other (comment) (sats with ambulation) PT Visit Diagnosis: Muscle weakness (generalized) (M62.81);Difficulty in walking, not elsewhere classified (R26.2);Pain Pain - part of body:  (back)     Time: 8784-8754 PT Time Calculation (min) (ACUTE ONLY): 30 min  Charges:    $Therapeutic Exercise: 23-37 mins PT General Charges $$ ACUTE PT VISIT: 1 Visit                     Darrick Greenlaw R. PTA Acute Rehabilitation Services Office: [phone]   Therisa CHRISTELLA Boor 04/12/2024, 4:21 PM

## 2024-04-13 ENCOUNTER — Inpatient Hospital Stay (HOSPITAL_COMMUNITY)

## 2024-04-13 ENCOUNTER — Encounter (HOSPITAL_COMMUNITY): Payer: Self-pay | Admitting: Hospitalist

## 2024-04-13 DIAGNOSIS — I5033 Acute on chronic diastolic (congestive) heart failure: Secondary | ICD-10-CM | POA: Diagnosis present

## 2024-04-13 DIAGNOSIS — J9601 Acute respiratory failure with hypoxia: Secondary | ICD-10-CM | POA: Diagnosis not present

## 2024-04-13 DIAGNOSIS — E43 Unspecified severe protein-calorie malnutrition: Secondary | ICD-10-CM

## 2024-04-13 DIAGNOSIS — I10 Essential (primary) hypertension: Secondary | ICD-10-CM | POA: Diagnosis present

## 2024-04-13 DIAGNOSIS — I2510 Atherosclerotic heart disease of native coronary artery without angina pectoris: Secondary | ICD-10-CM | POA: Insufficient documentation

## 2024-04-13 DIAGNOSIS — E7849 Other hyperlipidemia: Secondary | ICD-10-CM

## 2024-04-13 DIAGNOSIS — I4891 Unspecified atrial fibrillation: Secondary | ICD-10-CM

## 2024-04-13 DIAGNOSIS — J438 Other emphysema: Secondary | ICD-10-CM | POA: Diagnosis not present

## 2024-04-13 DIAGNOSIS — J188 Other pneumonia, unspecified organism: Secondary | ICD-10-CM | POA: Diagnosis not present

## 2024-04-13 DIAGNOSIS — R918 Other nonspecific abnormal finding of lung field: Secondary | ICD-10-CM | POA: Diagnosis not present

## 2024-04-13 DIAGNOSIS — E785 Hyperlipidemia, unspecified: Secondary | ICD-10-CM | POA: Diagnosis not present

## 2024-04-13 LAB — CBC
HCT: 36.2 % — ABNORMAL LOW (ref 39.0–52.0)
Hemoglobin: 12.4 g/dL — ABNORMAL LOW (ref 13.0–17.0)
MCH: 31.4 pg (ref 26.0–34.0)
MCHC: 34.3 g/dL (ref 30.0–36.0)
MCV: 91.6 fL (ref 80.0–100.0)
Platelets: 486 10*3/uL — ABNORMAL HIGH (ref 150–400)
RBC: 3.95 MIL/uL — ABNORMAL LOW (ref 4.22–5.81)
RDW: 12.9 % (ref 11.5–15.5)
WBC: 9.1 10*3/uL (ref 4.0–10.5)
nRBC: 0 % (ref 0.0–0.2)

## 2024-04-13 LAB — BASIC METABOLIC PANEL WITH GFR
Anion gap: 10 (ref 5–15)
BUN: 39 mg/dL — ABNORMAL HIGH (ref 8–23)
CO2: 35 mmol/L — ABNORMAL HIGH (ref 22–32)
Calcium: 8.5 mg/dL — ABNORMAL LOW (ref 8.9–10.3)
Chloride: 91 mmol/L — ABNORMAL LOW (ref 98–111)
Creatinine, Ser: 1.04 mg/dL (ref 0.61–1.24)
GFR, Estimated: >60 mL/min
Glucose, Bld: 183 mg/dL — ABNORMAL HIGH (ref 70–99)
Potassium: 3.7 mmol/L (ref 3.5–5.1)
Sodium: 136 mmol/L (ref 135–145)

## 2024-04-13 MED ORDER — VANCOMYCIN HCL 1750 MG/350ML IV SOLN
1750.0000 mg | INTRAVENOUS | Status: AC
  Administered 2024-04-13: 1750 mg via INTRAVENOUS
  Filled 2024-04-13: qty 350

## 2024-04-13 MED ORDER — MENTHOL 3 MG MT LOZG
1.0000 | LOZENGE | OROMUCOSAL | Status: DC | PRN
  Administered 2024-04-13: 3 mg via ORAL
  Filled 2024-04-13: qty 9

## 2024-04-13 MED ORDER — BENZONATATE 100 MG PO CAPS: 100.0000 mg | ORAL_CAPSULE | Freq: Three times a day (TID) | ORAL | Status: DC | PRN

## 2024-04-13 MED ORDER — ACETAMINOPHEN 500 MG PO TABS: 1000.0000 mg | ORAL_TABLET | Freq: Four times a day (QID) | ORAL | Status: DC | PRN

## 2024-04-13 NOTE — Assessment & Plan Note (Addendum)
 Prior to 04/13/24 treated with IV solumedrol for COPD exacerbation by [facility]  04/13/24 remains on IV steroids. Trying to avoid scheduled albuterol  due to his recent rapid afib. On IV abx for his multifocal pneumonia.  04/14/24 remains on IV steroids per PCCM.  04/15/24 on po steroids. Will arrange for home nebs machine.

## 2024-04-13 NOTE — Assessment & Plan Note (Addendum)
 Prior to 04/13/24 on zocor   04/13/24 continue zocor   04/14/24 on zocor .  04/15/24 on zocor 

## 2024-04-13 NOTE — Assessment & Plan Note (Addendum)
 Prior to 04/13/24 cards consulted for rapid afib and CHF due to rapid afib. He has been getting diuresed with IV lasix  by cards  04/13/24 cards continuing IV lasix  40 mg bid for now.   04/14/24 cards managing his IV lasix .  04/15/24 now off diuretics. F/u with outpatient cards. Dry weight today 185 lbs. Admission weight 190 lbs. Discharge proBNP of 613

## 2024-04-13 NOTE — Progress Notes (Signed)
 Pharmacy Antibiotic Note  KARANVEER RAMAKRISHNAN is a 79 y.o. male admitted on 04/06/2024 with pneumonia.  Pharmacy has been consulted for vancomycin  + Zosyn  dosing.  1/31 Vancomycin  1750mg  Q 24 hr with Est AUC: 489 Scr used: 1.04 mg/dL; Vd coeff: 0.72 L/kg  Plan: Vancomycin  1750mg  q24hr Zosyn  EI q8h 5day end per CCM entered  Height: 6' 1.2 (185.9 cm) Weight: 83.3 kg (183 lb 10.3 oz) IBW/kg (Calculated) : 80.36  Temp (24hrs), Avg:97.7 F (36.5 C), Min:97.5 F (36.4 C), Max:98 F (36.7 C)  Recent Labs  Lab 04/07/24 0205 04/08/24 0018 04/09/24 0237 04/10/24 0957 04/11/24 0303 04/12/24 0216 04/13/24 0217  WBC 8.7  --   --  10.1 13.4*  --  9.1  CREATININE 1.16   < > 0.89 0.85 0.86 0.97 1.04   < > = values in this interval not displayed.    Estimated Creatinine Clearance: 66.6 mL/min (by C-G formula based on SCr of 1.04 mg/dL).    Allergies[1]  Antimicrobials CTX 1/24 >> 1/28  azithro 1/24 >> 1/26  Vanc 1/28 >> 2/1 Piptazo 1/28>> 2/1  Nystatin  1/28 >> 2/11  Microbiology  1/24 Bcx : ngtd 1/24 MRSA neg  1/28 RVP neg 1/30 sputum -bad specimen  Jinnie Door, PharmD, BCPS, BCCP Clinical Pharmacist  Please check AMION for all [facility] Pharmacy phone numbers After 10:00 PM, call Main Pharmacy [phone]      [1]  Allergies Allergen Reactions   Statins Other (See Comments)    Myalgia with some statins, on simvastatin  currently per spouse
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
## 2024-04-13 NOTE — Assessment & Plan Note (Addendum)
 Prior to 04/13/24 has been on multiple abx. Last changed to IV zosyn /vanco per PCCM on 04-10-2024  04/13/24 remains afebrile. O2 demand down to 5 L/min. Pt not normally on home O2. PCCM managing his IV aBX and IV steroids.  04/14/24 down to 3L/min. PCCM managing his IV ABX and IV steroids.  04/15/24 weaned to RA at rest. Awaiting home O2 evaluation. On Day #3 of IV zosyn . Change to po prednisone  per PCCM. Awaiting DC clearance by PCCM.

## 2024-04-13 NOTE — Consult Note (Signed)
 "  NAME:  Troy Jackson, MRN:  [id number], DOB:  [birth year], LOS: 7 ADMISSION DATE:  04/06/2024, CONSULTATION DATE:  04/10/24 REFERRING MD:  TRH, CHIEF COMPLAINT:  SOB   History of Present Illness:  79 year old man w/ hx of longstanding smoking, COPD, Afib on AC, HFpEF p/w worsening SOB and chills that started maybe 2 weeks ago.  He minimizes symptoms so have to get history from wife.  +cough with whitish sputum.  + wheezing improved somewhat with home nebs (wife is former RT).  Wife finally convinced him to go to hospital.  Here working diagnosis of uncontrolled afib leading to decompensated HFpEF +/- CAP; O2 needs have progressed despite diuresis and conversion to sinus rhythm (HHFNC, sats low mid 90s) so PCCM consulted.  He states overall he feels better than admit.    States no obvious inciting new meds or exposures.  Not currently smoking.  Sats have been 80s at home past week but again he minimizes DOE symptoms.  Flu/covid neg.  Patient has lost good deal of weight recently.  He states this is on purpose as he is dieting.  Pertinent  Medical History   Past Medical History:  Diagnosis Date   Anxiety    COPD (chronic obstructive pulmonary disease) (HCC)    Coronary artery disease 2017   stent   Diverticulitis    Dyspnea    doe, with exertion   Dysrhythmia    atrial fibrillation   GERD (gastroesophageal reflux disease)    Hyperlipidemia    Hypertension    Pneumonia    2019   Tobacco abuse 2017     Significant Hospital Events: Including procedures, antibiotic start and stop dates in addition to other pertinent events   1/24 admitted 1/28 PCCM admitted, started on antibiotics - vancomycin /zosyn , started on steroids 1/30 on 15L O2, feeling better  Interim History / Subjective:   Weaned to 5L O2 today No acute issues overnight, feeling better  Objective    Blood pressure (!) 121/57, pulse 70, temperature (!) 97.5 F (36.4 C), temperature source Oral, resp. rate 19, height  6' 1.2 (1.859 m), weight 83.3 kg, SpO2 94%.    FiO2 (%):  [40 %] 40 %   Intake/Output Summary (Last 24 hours) at 04/13/2024 1107 Last data filed at 04/13/2024 0810 Gross per 24 hour  Intake 440 ml  Output 1550 ml  Net -1110 ml   Filed Weights   04/11/24 0451 04/12/24 0500 04/13/24 0337  Weight: 84.7 kg 83.8 kg 83.3 kg    Examination: General: no distress, sitting up in bed HENT: [place]/AT, moist mucous membranes Lungs: bibasilar rales, no wheezing or crackles Cardiovascular: regular, no murmurs Abdomen: soft, non-tender, non-distended, +BS Extremities: no edema, warm Neuro: moving all extremities GU: n/a  Labs/imaging reviewed  CXR 1/29 1. Bilateral airspace opacities, right greater than left, superimposed on chronic interstitial lung disease. 2. Aortic atherosclerosis. 3. Partially imaged spinal stimulator.  Resolved problem list   Assessment and Plan  Acute Hypoxemic Respiratory failure w/ Bilateral opacities on CXR - differential includes pulmonary edema, HCAP vs inflammatory pneumonitis on top of baseline advanced emphysema.  Given O2 need level reasonable to treat all 3.  No s/s of aspiration. Continue course of Vanc/zosyn  for 5 days, pro cal elevated Difficulty obtaining adequate sputum sample for culture Diuresis as tolerated by BP and renal function ESR elevated, continue steroids, will plan taper in coming days Triple neb therapy Continue hypertonic nebs and continue flutter valve O2 for sats >  88%, seems comfortable on 5L today Family updated by phone   Labs   CBC: Recent Labs  Lab 04/07/24 0205 04/10/24 0957 04/11/24 0303 04/13/24 0217  WBC 8.7 10.1 13.4* 9.1  HGB 11.7* 13.1 12.2* 12.4*  HCT 34.6* 39.1 36.3* 36.2*  MCV 92.5 92.7 92.8 91.6  PLT 214 366 397 486*    Basic Metabolic Panel: Recent Labs  Lab 04/08/24 0018 04/09/24 0237 04/10/24 0957 04/11/24 0303 04/12/24 0216 04/13/24 0217  NA 135 139 137 136 138 136  K 3.3* 3.8 4.2 4.3 4.0  3.7  CL 97* 100 97* 97* 92* 91*  CO2 28 30 31 31  34* 35*  GLUCOSE 105* 110* 206* 149* 171* 183*  BUN 28* 25* 24* 30* 34* 39*  CREATININE 0.96 0.89 0.85 0.86 0.97 1.04  CALCIUM 8.3* 8.4* 9.1 8.8* 8.6* 8.5*  MG 2.0  --   --   --   --   --    GFR: Estimated Creatinine Clearance: 66.6 mL/min (by C-G formula based on SCr of 1.04 mg/dL). Recent Labs  Lab 04/07/24 0205 04/10/24 0957 04/10/24 1145 04/11/24 0303 04/13/24 0217  PROCALCITON  --   --  0.51  --   --   WBC 8.7 10.1  --  13.4* 9.1    Liver Function Tests: No results for input(s): AST, ALT, ALKPHOS, BILITOT, PROT, ALBUMIN in the last 168 hours.  No results for input(s): LIPASE, AMYLASE in the last 168 hours. No results for input(s): AMMONIA in the last 168 hours.  ABG    Component Value Date/Time   PHART 7.48 (H) 04/10/2024 1000   PCO2ART 42 04/10/2024 1000   PO2ART 53 (L) 04/10/2024 1000   HCO3 31.3 (H) 04/10/2024 1000   TCO2 27 07/31/2017 2317   O2SAT 88.4 04/10/2024 1000     Coagulation Profile: No results for input(s): INR, PROTIME in the last 168 hours.   Cardiac Enzymes: No results for input(s): CKTOTAL, CKMB, CKMBINDEX, TROPONINI in the last 168 hours.  HbA1C: No results found for: HGBA1C  CBG: No results for input(s): GLUCAP in the last 168 hours.     Critical care time: N/A    Dorn Chill, MD [facility] Office: [phone]   See Amion for personal pager PCCM on call pager [phone] until 7pm. Please call Elink 7p-7a. [phone]        "

## 2024-04-13 NOTE — Assessment & Plan Note (Addendum)
 Prior to 04/13/24 at home, he was lopressor  12.5 mg bid prn elevated HR.  04/13/24 now on cardizem  cd 240 mg daily.  04/14/24 BP stable. On cardizem  cd 240 mg daily.  04/15/24 on cardizem  cd 240 mg daily.

## 2024-04-13 NOTE — Assessment & Plan Note (Addendum)
 Prior to 04/13/24 Body mass index is 24.1 kg/m.

## 2024-04-13 NOTE — Assessment & Plan Note (Addendum)
 Prior to 04/13/24 was initially on IV cardizem  gtts. Given IV digoxin  per cards. Converted back to NSR. Now on Multaq , eliquis  and cardizem  CD. Echo shows normal LVEF.  04/14/24 in NSR on telemetry. On Eliquis  5 mg bid. Multaq  400 mg bid., cardizem  cd 240 mg daily.  04/15/24 on eliquis , multaq , cardizem  Cd. No further diuresis per cards. F/u with outpatient cards.

## 2024-04-14 ENCOUNTER — Encounter (HOSPITAL_COMMUNITY): Payer: Self-pay | Admitting: Hospitalist

## 2024-04-14 DIAGNOSIS — J9601 Acute respiratory failure with hypoxia: Secondary | ICD-10-CM | POA: Diagnosis not present

## 2024-04-14 DIAGNOSIS — I4891 Unspecified atrial fibrillation: Secondary | ICD-10-CM | POA: Diagnosis not present

## 2024-04-14 DIAGNOSIS — I5033 Acute on chronic diastolic (congestive) heart failure: Secondary | ICD-10-CM | POA: Diagnosis not present

## 2024-04-14 DIAGNOSIS — J438 Other emphysema: Secondary | ICD-10-CM

## 2024-04-14 DIAGNOSIS — J188 Other pneumonia, unspecified organism: Secondary | ICD-10-CM | POA: Diagnosis not present

## 2024-04-14 DIAGNOSIS — E785 Hyperlipidemia, unspecified: Secondary | ICD-10-CM

## 2024-04-14 LAB — CBC WITH DIFFERENTIAL/PLATELET
Abs Immature Granulocytes: 0.06 10*3/uL (ref 0.00–0.07)
Basophils Absolute: 0 10*3/uL (ref 0.0–0.1)
Basophils Relative: 0 %
Eosinophils Absolute: 0 10*3/uL (ref 0.0–0.5)
Eosinophils Relative: 0 %
HCT: 40.3 % (ref 39.0–52.0)
Hemoglobin: 13.2 g/dL (ref 13.0–17.0)
Immature Granulocytes: 1 %
Lymphocytes Relative: 4 %
Lymphs Abs: 0.5 10*3/uL — ABNORMAL LOW (ref 0.7–4.0)
MCH: 30.8 pg (ref 26.0–34.0)
MCHC: 32.8 g/dL (ref 30.0–36.0)
MCV: 94.2 fL (ref 80.0–100.0)
Monocytes Absolute: 0.2 10*3/uL (ref 0.1–1.0)
Monocytes Relative: 2 %
Neutro Abs: 10.4 10*3/uL — ABNORMAL HIGH (ref 1.7–7.7)
Neutrophils Relative %: 93 %
Platelets: 519 10*3/uL — ABNORMAL HIGH (ref 150–400)
RBC: 4.28 MIL/uL (ref 4.22–5.81)
RDW: 12.8 % (ref 11.5–15.5)
WBC: 11.1 10*3/uL — ABNORMAL HIGH (ref 4.0–10.5)
nRBC: 0 % (ref 0.0–0.2)

## 2024-04-14 LAB — BASIC METABOLIC PANEL WITH GFR
Anion gap: 11 (ref 5–15)
BUN: 48 mg/dL — ABNORMAL HIGH (ref 8–23)
CO2: 33 mmol/L — ABNORMAL HIGH (ref 22–32)
Calcium: 9 mg/dL (ref 8.9–10.3)
Chloride: 90 mmol/L — ABNORMAL LOW (ref 98–111)
Creatinine, Ser: 1.36 mg/dL — ABNORMAL HIGH (ref 0.61–1.24)
GFR, Estimated: 53 mL/min — ABNORMAL LOW
Glucose, Bld: 289 mg/dL — ABNORMAL HIGH (ref 70–99)
Potassium: 4 mmol/L (ref 3.5–5.1)
Sodium: 133 mmol/L — ABNORMAL LOW (ref 135–145)

## 2024-04-14 LAB — MAGNESIUM: Magnesium: 2.4 mg/dL (ref 1.7–2.4)

## 2024-04-14 MED ORDER — PREDNISONE 20 MG PO TABS: 20.0000 mg | ORAL_TABLET | Freq: Every day | ORAL | Status: DC

## 2024-04-14 MED ORDER — POTASSIUM CHLORIDE CRYS ER 20 MEQ PO TBCR
40.0000 meq | EXTENDED_RELEASE_TABLET | ORAL | Status: AC
  Administered 2024-04-14 (×2): 40 meq via ORAL
  Filled 2024-04-14 (×2): qty 2

## 2024-04-14 MED ORDER — PREDNISONE 10 MG PO TABS: 10.0000 mg | ORAL_TABLET | Freq: Every day | ORAL | Status: DC

## 2024-04-14 MED ORDER — PREDNISONE 20 MG PO TABS
30.0000 mg | ORAL_TABLET | Freq: Every day | ORAL | Status: DC
  Administered 2024-04-15: 30 mg via ORAL
  Filled 2024-04-14: qty 1

## 2024-04-14 NOTE — Plan of Care (Signed)
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
## 2024-04-14 NOTE — Consult Note (Signed)
 "  NAME:  Troy Jackson, MRN:  [id number], DOB:  1945/10/07, LOS: 8 ADMISSION DATE:  04/06/2024, CONSULTATION DATE:  04/10/24 REFERRING MD:  TRH, CHIEF COMPLAINT:  SOB   History of Present Illness:  79 year old man w/ hx of longstanding smoking, COPD, Afib on AC, HFpEF p/w worsening SOB and chills that started maybe 2 weeks ago.  He minimizes symptoms so have to get history from wife.  +cough with whitish sputum.  + wheezing improved somewhat with home nebs (wife is former RT).  Wife finally convinced him to go to hospital.  Here working diagnosis of uncontrolled afib leading to decompensated HFpEF +/- CAP; O2 needs have progressed despite diuresis and conversion to sinus rhythm (HHFNC, sats low mid 90s) so PCCM consulted.  He states overall he feels better than admit.    States no obvious inciting new meds or exposures.  Not currently smoking.  Sats have been 80s at home past week but again he minimizes DOE symptoms.  Flu/covid neg.  Patient has lost good deal of weight recently.  He states this is on purpose as he is dieting.  Pertinent  Medical History   Past Medical History:  Diagnosis Date   Anxiety    COPD (chronic obstructive pulmonary disease) (HCC)    Coronary artery disease 2017   stent   Diverticulitis    Dyspnea    doe, with exertion   Dysrhythmia    atrial fibrillation   GERD (gastroesophageal reflux disease)    Hyperlipidemia    Hypertension    NSTEMI (non-ST elevated myocardial infarction) (HCC) 02/11/2016   Pneumonia    2019   Tobacco abuse 2017   Unstable angina (HCC)      Significant Hospital Events: Including procedures, antibiotic start and stop dates in addition to other pertinent events   1/24 admitted 1/28 PCCM admitted, started on antibiotics - vancomycin /zosyn , started on steroids 1/30 on 15L O2, feeling better 1/31 weaned to 5L O2  Interim History / Subjective:   Weaned to 3L O2 today I turned oxygen  off during visit, he remained 92% or higher on  room air at rest while talking No acute issues overnight, feeling better  Objective    Blood pressure 118/63, pulse 70, temperature 97.6 F (36.4 C), temperature source Oral, resp. rate 17, height 6' 1.2 (1.859 m), weight 89.3 kg, SpO2 95%.        Intake/Output Summary (Last 24 hours) at 04/14/2024 1314 Last data filed at 04/14/2024 1055 Gross per 24 hour  Intake 600 ml  Output 1100 ml  Net -500 ml   Filed Weights   04/12/24 0500 04/13/24 0337 04/14/24 0706  Weight: 83.8 kg 83.3 kg 89.3 kg    Examination: General: no distress, sitting up in bed HENT: /AT, moist mucous membranes Lungs: mild bibasilar rales, no wheezing or crackles Cardiovascular: regular, no murmurs Abdomen: soft, non-tender, non-distended, +BS Extremities: no edema, warm Neuro: moving all extremities GU: n/a  Resolved problem list   Assessment and Plan  Acute Hypoxemic Respiratory failure w/ Bilateral opacities on CXR - differential includes pulmonary edema, HCAP vs inflammatory pneumonitis on top of baseline advanced emphysema.  Given O2 need level reasonable to treat all 3.  No s/s of aspiration. Continue course of Vanc/zosyn  for 5 days, pro cal elevated Difficulty obtaining adequate sputum sample for culture Diuresis as tolerated by BP and renal function Will complete 5 days of IV solumedrol 40mg  daily today. Prednisone  taper entered to start tomorrow Triple neb therapy Continue  hypertonic nebs and continue flutter valve O2 for sats > 88%, seems comfortable on room air at this time. Monitor closely and re-apply supplemental oxygen  if needed   Labs   CBC: Recent Labs  Lab 04/10/24 0957 04/11/24 0303 04/13/24 0217  WBC 10.1 13.4* 9.1  HGB 13.1 12.2* 12.4*  HCT 39.1 36.3* 36.2*  MCV 92.7 92.8 91.6  PLT 366 397 486*    Basic Metabolic Panel: Recent Labs  Lab 04/08/24 0018 04/09/24 0237 04/10/24 0957 04/11/24 0303 04/12/24 0216 04/13/24 0217  NA 135 139 137 136 138 136  K 3.3* 3.8  4.2 4.3 4.0 3.7  CL 97* 100 97* 97* 92* 91*  CO2 28 30 31 31  34* 35*  GLUCOSE 105* 110* 206* 149* 171* 183*  BUN 28* 25* 24* 30* 34* 39*  CREATININE 0.96 0.89 0.85 0.86 0.97 1.04  CALCIUM 8.3* 8.4* 9.1 8.8* 8.6* 8.5*  MG 2.0  --   --   --   --   --    GFR: Estimated Creatinine Clearance: 66.6 mL/min (by C-G formula based on SCr of 1.04 mg/dL). Recent Labs  Lab 04/10/24 0957 04/10/24 1145 04/11/24 0303 04/13/24 0217  PROCALCITON  --  0.51  --   --   WBC 10.1  --  13.4* 9.1    Liver Function Tests: No results for input(s): AST, ALT, ALKPHOS, BILITOT, PROT, ALBUMIN in the last 168 hours.  No results for input(s): LIPASE, AMYLASE in the last 168 hours. No results for input(s): AMMONIA in the last 168 hours.  ABG    Component Value Date/Time   PHART 7.48 (H) 04/10/2024 1000   PCO2ART 42 04/10/2024 1000   PO2ART 53 (L) 04/10/2024 1000   HCO3 31.3 (H) 04/10/2024 1000   TCO2 27 07/31/2017 2317   O2SAT 88.4 04/10/2024 1000     Coagulation Profile: No results for input(s): INR, PROTIME in the last 168 hours.   Cardiac Enzymes: No results for input(s): CKTOTAL, CKMB, CKMBINDEX, TROPONINI in the last 168 hours.  HbA1C: No results found for: HGBA1C  CBG: No results for input(s): GLUCAP in the last 168 hours.     Critical care time: N/A    Dorn Chill, MD [facility] Office: [phone]   See Amion for personal pager PCCM on call pager [phone] until 7pm. Please call Elink 7p-7a. [phone]        "

## 2024-04-15 ENCOUNTER — Telehealth: Payer: Self-pay | Admitting: Pulmonary Disease

## 2024-04-15 DIAGNOSIS — J188 Other pneumonia, unspecified organism: Secondary | ICD-10-CM | POA: Diagnosis not present

## 2024-04-15 DIAGNOSIS — J9601 Acute respiratory failure with hypoxia: Secondary | ICD-10-CM | POA: Diagnosis not present

## 2024-04-15 DIAGNOSIS — I5033 Acute on chronic diastolic (congestive) heart failure: Secondary | ICD-10-CM | POA: Diagnosis not present

## 2024-04-15 DIAGNOSIS — I2510 Atherosclerotic heart disease of native coronary artery without angina pectoris: Secondary | ICD-10-CM

## 2024-04-15 DIAGNOSIS — I4891 Unspecified atrial fibrillation: Secondary | ICD-10-CM | POA: Diagnosis not present

## 2024-04-15 DIAGNOSIS — R918 Other nonspecific abnormal finding of lung field: Secondary | ICD-10-CM | POA: Diagnosis not present

## 2024-04-15 LAB — CBC WITH DIFFERENTIAL/PLATELET
Abs Immature Granulocytes: 0.08 10*3/uL — ABNORMAL HIGH (ref 0.00–0.07)
Basophils Absolute: 0 10*3/uL (ref 0.0–0.1)
Basophils Relative: 0 %
Eosinophils Absolute: 0 10*3/uL (ref 0.0–0.5)
Eosinophils Relative: 0 %
HCT: 36.2 % — ABNORMAL LOW (ref 39.0–52.0)
Hemoglobin: 12.5 g/dL — ABNORMAL LOW (ref 13.0–17.0)
Immature Granulocytes: 1 %
Lymphocytes Relative: 9 %
Lymphs Abs: 0.8 10*3/uL (ref 0.7–4.0)
MCH: 31.9 pg (ref 26.0–34.0)
MCHC: 34.5 g/dL (ref 30.0–36.0)
MCV: 92.3 fL (ref 80.0–100.0)
Monocytes Absolute: 0.7 10*3/uL (ref 0.1–1.0)
Monocytes Relative: 7 %
Neutro Abs: 7.8 10*3/uL — ABNORMAL HIGH (ref 1.7–7.7)
Neutrophils Relative %: 83 %
Platelets: 497 10*3/uL — ABNORMAL HIGH (ref 150–400)
RBC: 3.92 MIL/uL — ABNORMAL LOW (ref 4.22–5.81)
RDW: 12.8 % (ref 11.5–15.5)
WBC: 9.4 10*3/uL (ref 4.0–10.5)
nRBC: 0 % (ref 0.0–0.2)

## 2024-04-15 LAB — PRO BRAIN NATRIURETIC PEPTIDE: Pro Brain Natriuretic Peptide: 613 pg/mL — ABNORMAL HIGH

## 2024-04-15 LAB — COMPREHENSIVE METABOLIC PANEL WITH GFR
ALT: 123 U/L — ABNORMAL HIGH (ref 0–44)
AST: 26 U/L (ref 15–41)
Albumin: 3 g/dL — ABNORMAL LOW (ref 3.5–5.0)
Alkaline Phosphatase: 72 U/L (ref 38–126)
Anion gap: 11 (ref 5–15)
BUN: 48 mg/dL — ABNORMAL HIGH (ref 8–23)
CO2: 29 mmol/L (ref 22–32)
Calcium: 8.6 mg/dL — ABNORMAL LOW (ref 8.9–10.3)
Chloride: 94 mmol/L — ABNORMAL LOW (ref 98–111)
Creatinine, Ser: 1.18 mg/dL (ref 0.61–1.24)
GFR, Estimated: >60 mL/min
Glucose, Bld: 199 mg/dL — ABNORMAL HIGH (ref 70–99)
Potassium: 4.3 mmol/L (ref 3.5–5.1)
Sodium: 134 mmol/L — ABNORMAL LOW (ref 135–145)
Total Bilirubin: 0.4 mg/dL (ref 0.0–1.2)
Total Protein: 5.8 g/dL — ABNORMAL LOW (ref 6.5–8.1)

## 2024-04-15 LAB — MAGNESIUM: Magnesium: 2.4 mg/dL (ref 1.7–2.4)

## 2024-04-15 MED ORDER — REVEFENACIN 175 MCG/3ML IN SOLN: 175.0000 ug | Freq: Every day | RESPIRATORY_TRACT | 0 refills | Status: AC

## 2024-04-15 MED ORDER — ARFORMOTEROL TARTRATE 15 MCG/2ML IN NEBU: 15.0000 ug | INHALATION_SOLUTION | Freq: Two times a day (BID) | RESPIRATORY_TRACT | 0 refills | Status: AC

## 2024-04-15 MED ORDER — BUDESONIDE 0.25 MG/2ML IN SUSP: 0.2500 mg | Freq: Two times a day (BID) | RESPIRATORY_TRACT | 0 refills | Status: AC

## 2024-04-15 MED ORDER — DRONEDARONE HCL 400 MG PO TABS: 400.0000 mg | ORAL_TABLET | Freq: Two times a day (BID) | ORAL | 0 refills | Status: AC

## 2024-04-15 MED ORDER — PREDNISONE 10 MG PO TABS: ORAL_TABLET | ORAL | 0 refills | Status: AC

## 2024-04-15 MED ORDER — PANTOPRAZOLE SODIUM 40 MG PO TBEC: 40.0000 mg | DELAYED_RELEASE_TABLET | Freq: Every day | ORAL | 0 refills | Status: AC

## 2024-04-15 MED ORDER — ALBUTEROL SULFATE (2.5 MG/3ML) 0.083% IN NEBU: 2.5000 mg | INHALATION_SOLUTION | RESPIRATORY_TRACT | 0 refills | Status: AC | PRN

## 2024-04-15 NOTE — Telephone Encounter (Signed)
 Please schedule hospital follow up with me in 1-3 weeks for pneumonia and respiratory failure.  Thanks, JD

## 2024-04-15 NOTE — Progress Notes (Signed)
 SATURATION QUALIFICATIONS: (This note is used to comply with regulatory documentation for home oxygen )  Patient Saturations on Room Air at Rest = 97%  Patient Saturations on Room Air while Ambulating = 88-93%  Patient Saturations on 2 Liters of oxygen  while Ambulating = 93-96%  Please briefly explain why patient needs home oxygen :

## 2024-04-15 NOTE — Progress Notes (Signed)
 Home O2 delivered to room

## 2024-04-15 NOTE — TOC Progression Note (Addendum)
 Transition of Care [facility]) - Progression Note    Patient Details  Name: Troy Jackson MRN: [id number] Date of Birth: Jul 30, 1945  [facility] CM/SW Contact  Nola Devere Hands, RN Phone Number: 04/15/2024, 1:03 PM  Clinical Narrative:    Case manager has requested oxygen  for patient, will be delivered by [employer], concentrator will be delivered to patient's home. Home Health was arranged with [facility] by previous Case Manager. CM spoke with patient's daughter, Sharyne concerning delivery of concentrator and reviewed Home Health arrangements.     Expected Discharge Plan: Home w Home Health Services Barriers to Discharge: No Barriers Identified               Expected Discharge Plan and Services   Discharge Planning Services: CM Consult Post Acute Care Choice: Durable Medical Equipment Living arrangements for the past 2 months: Single Family Home Expected Discharge Date: 04/15/24               DME Arranged: Oxygen  DME Agency: [employer] Date DME Agency Contacted: 04/15/24 Time DME Agency Contacted: 1300 Representative spoke with at DME Agency: Jermaine             Social Drivers of Health (SDOH) Interventions SDOH Screenings   Food Insecurity: No Food Insecurity (04/06/2024)  Housing: Low Risk (04/06/2024)  Transportation Needs: No Transportation Needs (04/06/2024)  Utilities: Not At Risk (04/06/2024)  Social Connections: Socially Integrated (04/06/2024)  Tobacco Use: Medium Risk (04/14/2024)    Readmission Risk Interventions     No data to display

## 2024-04-16 ENCOUNTER — Encounter: Payer: Self-pay | Admitting: Cardiology

## 2024-04-16 NOTE — Telephone Encounter (Signed)
 Attempted to call pt. Left voicemail for Pt to call office back to get scheduled.

## 2024-04-17 NOTE — Telephone Encounter (Signed)
 Message sent to Pharmacy team for advice.

## 2024-04-19 ENCOUNTER — Other Ambulatory Visit (HOSPITAL_COMMUNITY): Payer: Self-pay

## 2024-04-19 ENCOUNTER — Telehealth: Payer: Self-pay | Admitting: Pharmacy Technician

## 2024-04-19 NOTE — Telephone Encounter (Signed)
" ° ° ° °  Per test claim for multaq  copay is 0.00  I called [facility] [phone] and now it free   I called the daughter/patient to let them know and sent a mychart "

## 2024-04-19 NOTE — Telephone Encounter (Signed)
 Patient scheduled for 05/06/24.

## 2024-04-29 ENCOUNTER — Ambulatory Visit: Admitting: Emergency Medicine

## 2024-05-06 ENCOUNTER — Inpatient Hospital Stay: Admitting: Pulmonary Disease

## 2024-06-25 ENCOUNTER — Ambulatory Visit: Admitting: Cardiology
